# Patient Record
Sex: Female | Born: 1950
Health system: Southern US, Community
[De-identification: ages and names within clinical notes are randomized; demographics above are authoritative.]

## PROBLEM LIST (undated history)

## (undated) DIAGNOSIS — I890 Lymphedema, not elsewhere classified: Secondary | ICD-10-CM

## (undated) DIAGNOSIS — M1990 Unspecified osteoarthritis, unspecified site: Secondary | ICD-10-CM

## (undated) DIAGNOSIS — M810 Age-related osteoporosis without current pathological fracture: Secondary | ICD-10-CM

## (undated) HISTORY — PX: BARIATRIC SURGERY: SHX1103

## (undated) HISTORY — PX: OTHER SURGICAL HISTORY: SHX169

## (undated) HISTORY — DX: Unspecified osteoarthritis, unspecified site: M19.90

## (undated) HISTORY — PX: OOPHORECTOMY: SHX86

---

## 1978-11-10 HISTORY — PX: ABDOMINAL HYSTERECTOMY: SHX81

## 2009-11-10 HISTORY — PX: CHOLECYSTECTOMY: SHX55

## 2017-02-23 ENCOUNTER — Encounter: Payer: Self-pay | Admitting: Family

## 2017-02-23 ENCOUNTER — Ambulatory Visit (INDEPENDENT_AMBULATORY_CARE_PROVIDER_SITE_OTHER): Payer: Self-pay | Admitting: Family

## 2017-02-23 VITALS — BP 144/84 | HR 62 | Temp 98.1°F | Ht 64.0 in | Wt 178.8 lb

## 2017-02-23 DIAGNOSIS — Z0000 Encounter for general adult medical examination without abnormal findings: Secondary | ICD-10-CM

## 2017-02-23 DIAGNOSIS — Z1231 Encounter for screening mammogram for malignant neoplasm of breast: Secondary | ICD-10-CM

## 2017-02-23 DIAGNOSIS — Z1239 Encounter for other screening for malignant neoplasm of breast: Secondary | ICD-10-CM

## 2017-02-23 DIAGNOSIS — E785 Hyperlipidemia, unspecified: Secondary | ICD-10-CM

## 2017-02-23 NOTE — Patient Instructions (Signed)
Tdap at local pharmacy  Great meeting you  Come back for your physical and fasting labs.

## 2017-02-23 NOTE — Progress Notes (Signed)
Pre visit review using our clinic review tool, if applicable. No additional management support is needed unless otherwise documented below in the visit note. 

## 2017-02-23 NOTE — Progress Notes (Signed)
Subjective:    Patient ID: Brittany Farley, female    DOB: Oct 04, 1951, 66 y.o.   MRN: [id number]  CC: Brittany Farley is a 66 y.o. female who presents today to establish care and follow up on chronic disease.    HPI: Overall feeling well today. Came from Dr Truman Hayward in [facility]. Requesting records.    HLD- family history of  HLD. on lipitor.   She notes significant history one year ago, 06/2016 - an episode of left arm numbness. At that time. had a stress test at that time which was normal. Started at that time on ASA, lipitor. No chest pain or arm pain at this time.       last mammogram 06/2016. No longer does pap. Would like to do cologuard.   HISTORY:  Past Medical History:  Diagnosis Date  . Arthritis    Past Surgical History:  Procedure Laterality Date  . ABDOMINAL HYSTERECTOMY  1980  . CHOLECYSTECTOMY  2011   Family History  Problem Relation Age of Onset  . Breast cancer Mother 20  . Breast cancer Maternal Aunt 70  . Lung cancer Sister   . Diabetes Sister   . Prostate cancer Brother   . Heart disease Brother   . Stroke Cousin   . Hypertension Cousin     Allergies: Patient has no allergy information on record. No current outpatient prescriptions on file prior to visit.   No current facility-administered medications on file prior to visit.     Social History  Substance Use Topics  . Smoking status: Never Smoker  . Smokeless tobacco: Never Used  . Alcohol use Yes     Comment: occasional    Review of Systems  Constitutional: Negative for chills and fever.  Eyes: Negative for visual disturbance.  Respiratory: Negative for cough.   Cardiovascular: Negative for chest pain and palpitations.  Gastrointestinal: Negative for nausea and vomiting.  Neurological: Negative for numbness.      Objective:    BP (!) 144/84   Pulse 62   Temp 98.1 F (36.7 C) (Oral)   Ht 5\' 4"  (1.626 m)   Wt 178 lb 12.8 oz (81.1 kg)   SpO2 96%   BMI 30.69 kg/m  BP Readings from  Last 3 Encounters:  02/23/17 (!) 144/84   Wt Readings from Last 3 Encounters:  02/23/17 178 lb 12.8 oz (81.1 kg)    Physical Exam  Constitutional: She appears well-developed and well-nourished.  Eyes: Conjunctivae are normal.  Cardiovascular: Normal rate, regular rhythm, normal heart sounds and normal pulses.   Pulmonary/Chest: Effort normal and breath sounds normal. She has no wheezes. She has no rhonchi. She has no rales.  Neurological: She is alert.  Skin: Skin is warm and dry.  Psychiatric: She has a normal mood and affect. Her speech is normal and behavior is normal. Thought content normal.  Vitals reviewed.      Assessment & Plan:   Problem List Items Addressed This Visit      Other   Screening for breast cancer - Primary    Ordered. Patient understands to schedule.       Relevant Orders   MM DIGITAL SCREENING BILATERAL   Hyperlipidemia    Chronic. On lipitor. Will check lipid panel when fasting when patient returns for CPE.       Relevant Medications   atorvastatin (LIPITOR) 10 MG tablet   aspirin EC 81 MG tablet   Encounter for medical examination to establish care  Requesting records. cologuard ordered. She will return for CPE and labs.           I am having [name] maintain her atorvastatin, Vitamin D3, aspirin EC, and vitamin B-12.   Meds ordered this encounter  Medications  . atorvastatin (LIPITOR) 10 MG tablet    Sig: Take 10 mg by mouth daily.  . Cholecalciferol (VITAMIN D3) 2000 units TABS    Sig: Take by mouth.  Marland Kitchen aspirin EC 81 MG tablet    Sig: Take 81 mg by mouth daily.  . vitamin B-12 (CYANOCOBALAMIN) 1000 MCG tablet    Sig: Take 1,000 mcg by mouth daily.    Return precautions given.   Risks, benefits, and alternatives of the medications and treatment plan prescribed today were discussed, and patient expressed understanding.   Education regarding symptom management and diagnosis given to patient on AVS.  Continue to follow with  [name], FNP for routine health maintenance.   [name] and I agreed with plan.   [name], FNP

## 2017-02-24 ENCOUNTER — Encounter: Payer: Self-pay | Admitting: Family

## 2017-02-24 DIAGNOSIS — Z0000 Encounter for general adult medical examination without abnormal findings: Secondary | ICD-10-CM | POA: Insufficient documentation

## 2017-02-24 DIAGNOSIS — Z1239 Encounter for other screening for malignant neoplasm of breast: Secondary | ICD-10-CM | POA: Insufficient documentation

## 2017-02-24 DIAGNOSIS — E785 Hyperlipidemia, unspecified: Secondary | ICD-10-CM | POA: Insufficient documentation

## 2017-02-24 NOTE — Assessment & Plan Note (Signed)
Requesting records. cologuard ordered. She will return for CPE and labs.

## 2017-02-24 NOTE — Assessment & Plan Note (Addendum)
Ordered. Patient understands to schedule 

## 2017-02-24 NOTE — Assessment & Plan Note (Signed)
Chronic. On lipitor. Will check lipid panel when fasting when patient returns for CPE.

## 2017-02-26 ENCOUNTER — Telehealth: Payer: Self-pay | Admitting: *Deleted

## 2017-02-26 MED ORDER — ATORVASTATIN CALCIUM 10 MG PO TABS: 10.0000 mg | ORAL_TABLET | Freq: Every day | ORAL | 2 refills | Status: DC

## 2017-02-26 NOTE — Telephone Encounter (Signed)
Medication has been refilled.

## 2017-02-26 NOTE — Telephone Encounter (Signed)
Medication refill requested for atorvastatin- 90 day supply  [facility] -fax [phone]  Pt contact [phone]

## 2017-02-27 NOTE — Progress Notes (Signed)
Patient was given information to schedule mammo

## 2017-03-03 ENCOUNTER — Telehealth: Payer: Self-pay | Admitting: Family

## 2017-03-03 DIAGNOSIS — Z1211 Encounter for screening for malignant neoplasm of colon: Secondary | ICD-10-CM | POA: Diagnosis not present

## 2017-03-03 DIAGNOSIS — Z1212 Encounter for screening for malignant neoplasm of rectum: Secondary | ICD-10-CM | POA: Diagnosis not present

## 2017-03-03 DIAGNOSIS — L659 Nonscarring hair loss, unspecified: Secondary | ICD-10-CM

## 2017-03-03 NOTE — Telephone Encounter (Signed)
Patient has been informed of Mammogram location and number.

## 2017-03-03 NOTE — Progress Notes (Signed)
Patient has been informed.

## 2017-03-03 NOTE — Progress Notes (Signed)
Left message for patient to return call back.  

## 2017-03-03 NOTE — Telephone Encounter (Signed)
Pt called and stated that she was returning your call. Thank you!  Call pt @ [phone]

## 2017-03-04 LAB — COLOGUARD: COLOGUARD: NEGATIVE

## 2017-03-10 ENCOUNTER — Telehealth: Payer: Self-pay | Admitting: Family

## 2017-03-10 NOTE — Telephone Encounter (Signed)
Left message for patient to return call back.  

## 2017-03-10 NOTE — Telephone Encounter (Signed)
Please notify pt that cologuard neg  She may continue screening in 3 years unless sxs prior

## 2017-03-11 NOTE — Telephone Encounter (Signed)
Patient was informed of results.  Patient understood and no questions, comments, or concerns at this time.  

## 2017-03-11 NOTE — Telephone Encounter (Signed)
Pt called back returning your call. Thank you! °

## 2017-03-23 NOTE — Telephone Encounter (Signed)
Please advise 

## 2017-03-23 NOTE — Telephone Encounter (Signed)
Pt called and is requesting a referral for her hair loss. She states for the past 5 wks she has been losing her hair and it is now to the point where she is almost bald. She does not have a preference in where she goes. Please advise. Pt cb [phone]

## 2017-03-24 NOTE — Telephone Encounter (Signed)
Call pt  So sorry to hear. Referral placed to dermatology

## 2017-03-25 ENCOUNTER — Telehealth: Payer: Self-pay | Admitting: *Deleted

## 2017-03-25 NOTE — Telephone Encounter (Signed)
Patient has requested to see Christella Noa in [facility] for a dermatology  Pt contact [phone]

## 2017-04-01 ENCOUNTER — Encounter: Payer: Self-pay | Admitting: Family

## 2017-04-01 ENCOUNTER — Encounter (INDEPENDENT_AMBULATORY_CARE_PROVIDER_SITE_OTHER): Payer: Self-pay

## 2017-04-01 ENCOUNTER — Ambulatory Visit (INDEPENDENT_AMBULATORY_CARE_PROVIDER_SITE_OTHER): Payer: PPO | Admitting: Family

## 2017-04-01 VITALS — BP 120/76 | HR 56 | Temp 98.1°F | Resp 12 | Ht 64.0 in | Wt 172.8 lb

## 2017-04-01 DIAGNOSIS — R079 Chest pain, unspecified: Secondary | ICD-10-CM

## 2017-04-01 DIAGNOSIS — Z23 Encounter for immunization: Secondary | ICD-10-CM | POA: Diagnosis not present

## 2017-04-01 DIAGNOSIS — Z0000 Encounter for general adult medical examination without abnormal findings: Secondary | ICD-10-CM | POA: Diagnosis not present

## 2017-04-01 LAB — CBC WITH DIFFERENTIAL/PLATELET
Basophils Absolute: 0 10*3/uL (ref 0.0–0.1)
Basophils Relative: 0.7 % (ref 0.0–3.0)
EOS ABS: 0.4 10*3/uL (ref 0.0–0.7)
Eosinophils Relative: 10.2 % — ABNORMAL HIGH (ref 0.0–5.0)
HCT: 41.3 % (ref 36.0–46.0)
HEMOGLOBIN: 13.6 g/dL (ref 12.0–15.0)
LYMPHS ABS: 1.7 10*3/uL (ref 0.7–4.0)
Lymphocytes Relative: 37.6 % (ref 12.0–46.0)
MCHC: 32.9 g/dL (ref 30.0–36.0)
MCV: 75.2 fl — ABNORMAL LOW (ref 78.0–100.0)
MONO ABS: 0.3 10*3/uL (ref 0.1–1.0)
Monocytes Relative: 7.5 % (ref 3.0–12.0)
NEUTROS PCT: 44 % (ref 43.0–77.0)
Neutro Abs: 1.9 10*3/uL (ref 1.4–7.7)
Platelets: 309 10*3/uL (ref 150.0–400.0)
RBC: 5.49 Mil/uL — AB (ref 3.87–5.11)
RDW: 14.4 % (ref 11.5–15.5)
WBC: 4.4 10*3/uL (ref 4.0–10.5)

## 2017-04-01 LAB — COMPREHENSIVE METABOLIC PANEL
ALBUMIN: 4 g/dL (ref 3.5–5.2)
ALK PHOS: 151 U/L — AB (ref 39–117)
ALT: 23 U/L (ref 0–35)
AST: 33 U/L (ref 0–37)
BILIRUBIN TOTAL: 0.4 mg/dL (ref 0.2–1.2)
BUN: 18 mg/dL (ref 6–23)
CO2: 26 mEq/L (ref 19–32)
Calcium: 9.6 mg/dL (ref 8.4–10.5)
Chloride: 108 mEq/L (ref 96–112)
Creatinine, Ser: 0.73 mg/dL (ref 0.40–1.20)
GFR: 84.34 mL/min (ref >60.00)
GLUCOSE: 91 mg/dL (ref 70–99)
POTASSIUM: 4.6 meq/L (ref 3.5–5.1)
SODIUM: 141 meq/L (ref 135–145)
TOTAL PROTEIN: 7.2 g/dL (ref 6.0–8.3)

## 2017-04-01 LAB — LIPID PANEL
CHOLESTEROL: 133 mg/dL (ref 0–200)
HDL: 57.5 mg/dL (ref >39.00)
LDL Cholesterol: 66 mg/dL (ref 0–99)
NonHDL: 75.76
Total CHOL/HDL Ratio: 2
Triglycerides: 48 mg/dL (ref 0.0–149.0)
VLDL: 9.6 mg/dL (ref 0.0–40.0)

## 2017-04-01 LAB — VITAMIN D 25 HYDROXY (VIT D DEFICIENCY, FRACTURES): VITD: 47.3 ng/mL (ref 30.00–100.00)

## 2017-04-01 LAB — HEMOGLOBIN A1C: HEMOGLOBIN A1C: 6.2 % (ref 4.6–6.5)

## 2017-04-01 LAB — TSH: TSH: 1.34 u[IU]/mL (ref 0.35–4.50)

## 2017-04-01 MED ORDER — PNEUMOCOCCAL 13-VAL CONJ VACC IM SUSP: 0.5000 mL | Freq: Once | INTRAMUSCULAR | Status: DC

## 2017-04-01 NOTE — Patient Instructions (Addendum)
Shingrex series for shingles.   Prevnar given today. Pneumovax one year later.   Cardiology referral.   Please stay vigilant with any signs chest pain, shortness of breath, left arm pain, jaw pain, please go to emergency room immediately   Health Maintenance for Postmenopausal Women Menopause is a normal process in which your reproductive ability comes to an end. This process happens gradually over a span of months to years, usually between the ages of 34 and 2. Menopause is complete when you have missed 12 consecutive menstrual periods. It is important to talk with your health care provider about some of the most common conditions that affect postmenopausal women, such as heart disease, cancer, and bone loss (osteoporosis). Adopting a healthy lifestyle and getting preventive care can help to promote your health and wellness. Those actions can also lower your chances of developing some of these common conditions. What should I know about menopause? During menopause, you may experience a number of symptoms, such as:  Moderate-to-severe hot flashes.  Night sweats.  Decrease in sex drive.  Mood swings.  Headaches.  Tiredness.  Irritability.  Memory problems.  Insomnia. Choosing to treat or not to treat menopausal changes is an individual decision that you make with your health care provider. What should I know about hormone replacement therapy and supplements? Hormone therapy products are effective for treating symptoms that are associated with menopause, such as hot flashes and night sweats. Hormone replacement carries certain risks, especially as you become older. If you are thinking about using estrogen or estrogen with progestin treatments, discuss the benefits and risks with your health care provider. What should I know about heart disease and stroke? Heart disease, heart attack, and stroke become more likely as you age. This may be due, in part, to the hormonal changes that  your body experiences during menopause. These can affect how your body processes dietary fats, triglycerides, and cholesterol. Heart attack and stroke are both medical emergencies. There are many things that you can do to help prevent heart disease and stroke:  Have your blood pressure checked at least every 1-2 years. High blood pressure causes heart disease and increases the risk of stroke.  If you are 70-35 years old, ask your health care provider if you should take aspirin to prevent a heart attack or a stroke.  Do not use any tobacco products, including cigarettes, chewing tobacco, or electronic cigarettes. If you need help quitting, ask your health care provider.  It is important to eat a healthy diet and maintain a healthy weight.  Be sure to include plenty of vegetables, fruits, low-fat dairy products, and lean protein.  Avoid eating foods that are high in solid fats, added sugars, or salt (sodium).  Get regular exercise. This is one of the most important things that you can do for your health.  Try to exercise for at least 150 minutes each week. The type of exercise that you do should increase your heart rate and make you sweat. This is known as moderate-intensity exercise.  Try to do strengthening exercises at least twice each week. Do these in addition to the moderate-intensity exercise.  Know your numbers.Ask your health care provider to check your cholesterol and your blood glucose. Continue to have your blood tested as directed by your health care provider. What should I know about cancer screening? There are several types of cancer. Take the following steps to reduce your risk and to catch any cancer development as early as possible. Breast Cancer  Practice breast self-awareness.  This means understanding how your breasts normally appear and feel.  It also means doing regular breast self-exams. Let your health care provider know about any changes, no matter how  small.  If you are 58 or older, have a clinician do a breast exam (clinical breast exam or CBE) every year. Depending on your age, family history, and medical history, it may be recommended that you also have a yearly breast X-ray (mammogram).  If you have a family history of breast cancer, talk with your health care provider about genetic screening.  If you are at high risk for breast cancer, talk with your health care provider about having an MRI and a mammogram every year.  Breast cancer (BRCA) gene test is recommended for women who have family members with BRCA-related cancers. Results of the assessment will determine the need for genetic counseling and BRCA1 and for BRCA2 testing. BRCA-related cancers include these types:  Breast. This occurs in males or females.  Ovarian.  Tubal. This may also be called fallopian tube cancer.  Cancer of the abdominal or pelvic lining (peritoneal cancer).  Prostate.  Pancreatic. Cervical, Uterine, and Ovarian Cancer  Your health care provider may recommend that you be screened regularly for cancer of the pelvic organs. These include your ovaries, uterus, and vagina. This screening involves a pelvic exam, which includes checking for microscopic changes to the surface of your cervix (Pap test).  For women ages 21-65, health care providers may recommend a pelvic exam and a Pap test every three years. For women ages 49-65, they may recommend the Pap test and pelvic exam, combined with testing for human papilloma virus (HPV), every five years. Some types of HPV increase your risk of cervical cancer. Testing for HPV may also be done on women of any age who have unclear Pap test results.  Other health care providers may not recommend any screening for nonpregnant women who are considered low risk for pelvic cancer and have no symptoms. Ask your health care provider if a screening pelvic exam is right for you.  If you have had past treatment for cervical  cancer or a condition that could lead to cancer, you need Pap tests and screening for cancer for at least 20 years after your treatment. If Pap tests have been discontinued for you, your risk factors (such as having a new sexual partner) need to be reassessed to determine if you should start having screenings again. Some women have medical problems that increase the chance of getting cervical cancer. In these cases, your health care provider may recommend that you have screening and Pap tests more often.  If you have a family history of uterine cancer or ovarian cancer, talk with your health care provider about genetic screening.  If you have vaginal bleeding after reaching menopause, tell your health care provider.  There are currently no reliable tests available to screen for ovarian cancer. Lung Cancer  Lung cancer screening is recommended for adults 74-77 years old who are at high risk for lung cancer because of a history of smoking. A yearly low-dose CT scan of the lungs is recommended if you:  Currently smoke.  Have a history of at least 30 pack-years of smoking and you currently smoke or have quit within the past 15 years. A pack-year is smoking an average of one pack of cigarettes per day for one year. Yearly screening should:  Continue until it has been 15 years since you quit.  Stop  if you develop a health problem that would prevent you from having lung cancer treatment. Colorectal Cancer  This type of cancer can be detected and can often be prevented.  Routine colorectal cancer screening usually begins at age 66 and continues through age 3.  If you have risk factors for colon cancer, your health care provider may recommend that you be screened at an earlier age.  If you have a family history of colorectal cancer, talk with your health care provider about genetic screening.  Your health care provider may also recommend using home test kits to check for hidden blood in your  stool.  A small camera at the end of a tube can be used to examine your colon directly (sigmoidoscopy or colonoscopy). This is done to check for the earliest forms of colorectal cancer.  Direct examination of the colon should be repeated every 5-10 years until age 81. However, if early forms of precancerous polyps or small growths are found or if you have a family history or genetic risk for colorectal cancer, you may need to be screened more often. Skin Cancer  Check your skin from head to toe regularly.  Monitor any moles. Be sure to tell your health care provider:  About any new moles or changes in moles, especially if there is a change in a mole's shape or color.  If you have a mole that is larger than the size of a pencil eraser.  If any of your family members has a history of skin cancer, especially at a young age, talk with your health care provider about genetic screening.  Always use sunscreen. Apply sunscreen liberally and repeatedly throughout the day.  Whenever you are outside, protect yourself by wearing long sleeves, pants, a wide-brimmed hat, and sunglasses. What should I know about osteoporosis? Osteoporosis is a condition in which bone destruction happens more quickly than new bone creation. After menopause, you may be at an increased risk for osteoporosis. To help prevent osteoporosis or the bone fractures that can happen because of osteoporosis, the following is recommended:  If you are 66-49 years old, get at least 1,000 mg of calcium and at least 600 mg of vitamin D per day.  If you are older than age 76 but younger than age 70, get at least 1,200 mg of calcium and at least 600 mg of vitamin D per day.  If you are older than age 89, get at least 1,200 mg of calcium and at least 800 mg of vitamin D per day. Smoking and excessive alcohol intake increase the risk of osteoporosis. Eat foods that are rich in calcium and vitamin D, and do weight-bearing exercises several  times each week as directed by your health care provider. What should I know about how menopause affects my mental health? Depression may occur at any age, but it is more common as you become older. Common symptoms of depression include:  Low or sad mood.  Changes in sleep patterns.  Changes in appetite or eating patterns.  Feeling an overall lack of motivation or enjoyment of activities that you previously enjoyed.  Frequent crying spells. Talk with your health care provider if you think that you are experiencing depression. What should I know about immunizations? It is important that you get and maintain your immunizations. These include:  Tetanus, diphtheria, and pertussis (Tdap) booster vaccine.  Influenza every year before the flu season begins.  Pneumonia vaccine.  Shingles vaccine. Your health care provider may also recommend other  immunizations. This information is not intended to replace advice given to you by your health care provider. Make sure you discuss any questions you have with your health care provider. Document Released: 12/19/2005 Document Revised: 05/16/2016 Document Reviewed: 07/31/2015 Elsevier Interactive Patient Education  2017 [employer].

## 2017-04-01 NOTE — Assessment & Plan Note (Addendum)
Episode of chest pain after exercise. Reassured is no cardiac symptoms today or recurrence of chest pain. No prior EKG to compare to. EKG shows sinus rhythm. Bradycardia. No acute ischemia noted. Referral to cardiology as patient will likely require stress test, echocardiogram.

## 2017-04-01 NOTE — Assessment & Plan Note (Signed)
Color guard negative. Scheduled mammogram.  declines pelvic exam in the absence of symptoms, history of hysterectomy. Prevnar given. Screening labs ordered. Encouraged continued exercise.

## 2017-04-01 NOTE — Progress Notes (Signed)
Pre-visit discussion using our clinic review tool. No additional management support is needed unless otherwise documented below in the visit note.  

## 2017-04-01 NOTE — Progress Notes (Signed)
Subjective:    Patient ID: Brittany Farley, female    DOB: June 26, 1951, 66 y.o.   MRN: [id number]  CC: Brittany Farley is a 66 y.o. female who presents today for physical exam.    HPI: Reports episode of chest pain after 3 hour work out. Was sitting at desk writing notes and felt tightness for couple of seconds. Then resolved. Not r/t food. Denies numbness or tingling radiating to left arm or jaw, palpitations, dizziness, frequent headaches, changes in vision, or shortness of breath. No jaw pain, epigastric pain.  Doesn't recall stress test.  Family history of heart disease, CAD.     Colorectal Cancer Screening: cologuard negative.  Breast Cancer Screening: Mammogram scheduled.  Cervical Cancer Screening: h/o hysterectomy; no h/o GYN cancer. Had a pap one year ago, normal per patient.  Bone Health screening/DEXA for 65+: No increased fracture risk. Defer screening at this time. Lung Cancer Screening: Doesn't have 30 year pack year history and age > 51 years.  Immunizations       Tetanus - utd        Pneumococcal - pneuomovax 10/23/15.  Hepatitis C screening - Candidate for, consented Labs: Screening labs today. Exercise: Gets regular exercise.  Alcohol use: occasional Smoking/tobacco use: Nonsmoker.  Regular dental exams: In need of dental exam. Will establish Wears seat belt: Yes. Skin: dr Brittany Farley,   HISTORY:  Past Medical History:  Diagnosis Date  . Arthritis     Past Surgical History:  Procedure Laterality Date  . ABDOMINAL HYSTERECTOMY  1980  . CHOLECYSTECTOMY  2011   Family History  Problem Relation Age of Onset  . Breast cancer Mother 49  . Breast cancer Maternal Aunt 70  . Lung cancer Sister   . Diabetes Sister   . Prostate cancer Brother   . Heart disease Brother   . Stroke Cousin   . Hypertension Cousin       ALLERGIES: Patient has no allergy information on record.  Current Outpatient Prescriptions on File Prior to Visit  Medication Sig Dispense Refill  .  aspirin EC 81 MG tablet Take 81 mg by mouth daily.    Marland Kitchen atorvastatin (LIPITOR) 10 MG tablet Take 1 tablet (10 mg total) by mouth daily. 90 tablet 2  . Cholecalciferol (VITAMIN D3) 2000 units TABS Take by mouth.    . vitamin B-12 (CYANOCOBALAMIN) 1000 MCG tablet Take 1,000 mcg by mouth daily.     No current facility-administered medications on file prior to visit.     Social History  Substance Use Topics  . Smoking status: Never Smoker  . Smokeless tobacco: Never Used  . Alcohol use Yes     Comment: occasional    Review of Systems  Constitutional: Negative for chills, fever and unexpected weight change.  HENT: Negative for congestion.   Eyes: Negative for visual disturbance.  Respiratory: Negative for cough.   Cardiovascular: Positive for chest pain. Negative for palpitations and leg swelling.  Gastrointestinal: Negative for nausea and vomiting.  Musculoskeletal: Negative for arthralgias and myalgias.  Skin: Negative for rash.  Neurological: Negative for headaches.  Hematological: Negative for adenopathy.  Psychiatric/Behavioral: Negative for confusion.      Objective:    BP 120/76 (BP Location: Left Arm, Patient Position: Sitting, Cuff Size: Normal)   Pulse (!) 56   Temp 98.1 F (36.7 C) (Oral)   Resp 12   Ht 5\' 4"  (1.626 m)   Wt 172 lb 12.8 oz (78.4 kg)   SpO2 98%   BMI  29.66 kg/m   BP Readings from Last 3 Encounters:  04/01/17 120/76  02/23/17 (!) 144/84   Wt Readings from Last 3 Encounters:  04/01/17 172 lb 12.8 oz (78.4 kg)  02/23/17 178 lb 12.8 oz (81.1 kg)    Physical Exam  Constitutional: She appears well-developed and well-nourished.  Eyes: Conjunctivae are normal.  Neck: No thyroid mass and no thyromegaly present.  Cardiovascular: Normal rate, regular rhythm, normal heart sounds and normal pulses.   Pulmonary/Chest: Effort normal and breath sounds normal. She has no wheezes. She has no rhonchi. She has no rales. Right breast exhibits no inverted  nipple, no mass, no nipple discharge, no skin change and no tenderness. Left breast exhibits no inverted nipple, no mass, no nipple discharge, no skin change and no tenderness. Breasts are symmetrical.  CBE performed.   Lymphadenopathy:       Head (right side): No submental, no submandibular, no tonsillar, no preauricular, no posterior auricular and no occipital adenopathy present.       Head (left side): No submental, no submandibular, no tonsillar, no preauricular, no posterior auricular and no occipital adenopathy present.    She has no cervical adenopathy.       Right cervical: No superficial cervical, no deep cervical and no posterior cervical adenopathy present.      Left cervical: No superficial cervical, no deep cervical and no posterior cervical adenopathy present.    She has no axillary adenopathy.  Neurological: She is alert.  Skin: Skin is warm and dry.  Psychiatric: She has a normal mood and affect. Her speech is normal and behavior is normal. Thought content normal.  Vitals reviewed.      Assessment & Plan:   Problem List Items Addressed This Visit      Other   Routine physical examination - Primary    Color guard negative. Scheduled mammogram.  declines pelvic exam in the absence of symptoms, history of hysterectomy. Prevnar given. Screening labs ordered. Encouraged continued exercise.      Relevant Medications   pneumococcal 13-valent conjugate vaccine (PREVNAR 13) injection 0.5 mL   Other Relevant Orders   Comprehensive metabolic panel   CBC with Differential/Platelet   Hemoglobin A1c   Lipid panel   TSH   VITAMIN D 25 Hydroxy (Vit-D Deficiency, Fractures)   Hepatitis C antibody   Chest pain    Episode of chest pain after exercise. Reassured is no cardiac symptoms today or recurrence of chest pain. No prior EKG to compare to. EKG shows sinus rhythm. Bradycardia. No acute ischemia noted. Referral to cardiology as patient will likely require stress test,  echocardiogram.      Relevant Orders   Ambulatory referral to Cardiology   EKG 12-Lead (Completed)    Other Visit Diagnoses    Need for pneumococcal vaccination           I am having [name] maintain her Vitamin D3, aspirin EC, vitamin B-12, and atorvastatin. We will continue to administer pneumococcal 13-valent conjugate vaccine.   Meds ordered this encounter  Medications  . pneumococcal 13-valent conjugate vaccine (PREVNAR 13) injection 0.5 mL    Return precautions given.   Risks, benefits, and alternatives of the medications and treatment plan prescribed today were discussed, and patient expressed understanding.   Education regarding symptom management and diagnosis given to patient on AVS.   Continue to follow with Burnard Hawthorne, FNP for routine health maintenance.   [name] and I agreed with plan.   [name],  FNP    

## 2017-04-02 ENCOUNTER — Other Ambulatory Visit: Payer: Self-pay | Admitting: Family

## 2017-04-02 DIAGNOSIS — R748 Abnormal levels of other serum enzymes: Secondary | ICD-10-CM

## 2017-04-02 LAB — HEPATITIS C ANTIBODY: HCV Ab: NEGATIVE

## 2017-04-03 DIAGNOSIS — Z853 Personal history of malignant neoplasm of breast: Secondary | ICD-10-CM | POA: Diagnosis not present

## 2017-04-03 DIAGNOSIS — L658 Other specified nonscarring hair loss: Secondary | ICD-10-CM | POA: Diagnosis not present

## 2017-04-03 DIAGNOSIS — Z85118 Personal history of other malignant neoplasm of bronchus and lung: Secondary | ICD-10-CM | POA: Diagnosis not present

## 2017-04-08 ENCOUNTER — Telehealth: Payer: Self-pay | Admitting: Family

## 2017-04-08 DIAGNOSIS — R7303 Prediabetes: Secondary | ICD-10-CM

## 2017-04-08 NOTE — Telephone Encounter (Signed)
Pt would like to have a referral to a nutritionist as per her conversation with Arnett.

## 2017-04-08 NOTE — Telephone Encounter (Signed)
Please advise 

## 2017-04-09 NOTE — Telephone Encounter (Signed)
Let pt know referral placed

## 2017-04-09 NOTE — Telephone Encounter (Signed)
Left message for patient to return call back.  Patient needs to be told that referral has been placed to Nutritionist

## 2017-04-09 NOTE — Telephone Encounter (Signed)
Pt called back and informed her of the information.

## 2017-04-10 ENCOUNTER — Other Ambulatory Visit (INDEPENDENT_AMBULATORY_CARE_PROVIDER_SITE_OTHER): Payer: PPO

## 2017-04-10 DIAGNOSIS — R748 Abnormal levels of other serum enzymes: Secondary | ICD-10-CM

## 2017-04-10 NOTE — Addendum Note (Signed)
Addended by: Leeanne Rio on: 04/10/2017 02:12 PM   Modules accepted: Orders

## 2017-04-12 LAB — ALKALINE PHOSPHATASE, ISOENZYMES
ALK PHOS: 162 IU/L — AB (ref 39–117)
BONE FRACTION: 61 % (ref 14–68)
INTESTINAL FRAC.: 6 % (ref 0–18)
LIVER FRACTION: 33 % (ref 18–85)

## 2017-04-15 ENCOUNTER — Other Ambulatory Visit: Payer: Self-pay | Admitting: Family

## 2017-04-15 DIAGNOSIS — R748 Abnormal levels of other serum enzymes: Secondary | ICD-10-CM | POA: Insufficient documentation

## 2017-04-16 NOTE — Telephone Encounter (Signed)
Patient was informed of results.  Patient understood and no questions, comments, or concerns at this time.  

## 2017-04-16 NOTE — Telephone Encounter (Signed)
Pt lvm stating she is rtc brocks call. Pt cb [phone]

## 2017-04-21 NOTE — Telephone Encounter (Signed)
Patient wanted [place] to call insurance company due to a hiccup on our paperwork.  Patients name does not match what insurance has.  Patient informed me that the insurance has older name.

## 2017-04-21 NOTE — Telephone Encounter (Signed)
Patient returned your call.

## 2017-04-21 NOTE — Telephone Encounter (Signed)
Left message for patient to return call back.  

## 2017-04-21 NOTE — Telephone Encounter (Signed)
Pt lvm for [facility] to rtc. She said it is regarding a billing concern. Pt cb [phone]

## 2017-04-22 NOTE — Telephone Encounter (Signed)
I have been placed on hold for 30 minutes.  Unable to reach a person. Will try again at another time.

## 2017-04-23 NOTE — Telephone Encounter (Signed)
Given number to call [phone] from first set of people.  They will have the information patient is wanting fixed.

## 2017-04-28 NOTE — Telephone Encounter (Signed)
Patient was informed that she needed to change her name in Social Security then have a new card mailed.

## 2017-05-19 ENCOUNTER — Encounter: Payer: Self-pay | Admitting: Cardiovascular Disease

## 2017-05-19 ENCOUNTER — Ambulatory Visit (INDEPENDENT_AMBULATORY_CARE_PROVIDER_SITE_OTHER): Payer: PPO | Admitting: Cardiovascular Disease

## 2017-05-19 VITALS — BP 120/70 | HR 62 | Ht 65.0 in | Wt 175.5 lb

## 2017-05-19 DIAGNOSIS — R079 Chest pain, unspecified: Secondary | ICD-10-CM | POA: Diagnosis not present

## 2017-05-19 DIAGNOSIS — E785 Hyperlipidemia, unspecified: Secondary | ICD-10-CM | POA: Diagnosis not present

## 2017-05-19 NOTE — Patient Instructions (Addendum)
Medication Instructions:  Your physician recommends that you continue on your current medications as directed. Please refer to the Current Medication list given to you today.   Labwork: none  Testing/Procedures: Your physician has requested that you have a stress echocardiogram. For further information please visit [email or web address]. Please follow instruction sheet as given.    Follow-Up: Your physician recommends that you schedule a follow-up appointment as needed.    Any Other Special Instructions Will Be Listed Below (If Applicable).     If you need a refill on your cardiac medications before your next appointment, please call your pharmacy.   Exercise Stress Echocardiogram An exercise stress echocardiogram is a test to check how well your heart is working. This test uses sound waves (ultrasound) and a computer to make images of your heart before and after exercise. Ultrasound images that are taken before you exercise (your resting echocardiogram) will show how much blood is getting to your heart muscle and how well your heart muscle and heart valves are functioning. During the next part of this test, you will walk on a treadmill or ride a stationary bike to see how exercise affects your heart. While you exercise, the electrical activity of your heart will be monitored with an electrocardiogram (ECG). Your blood pressure will also be monitored. You may have this test if you:  Have chest pain or other symptoms of a heart problem.  Recently had a heart attack or heart surgery.  Have heart valve problems.  Have a condition that causes narrowing of the blood vessels that supply your heart (coronary artery disease).  Have a high risk of heart disease and are starting a new exercise program.  Have a high risk of heart disease and need to have major surgery.  Tell a health care provider about:  Any allergies you have.  All medicines you are taking, including vitamins,  herbs, eye drops, creams, and over-the-counter medicines.  Any problems you or family members have had with anesthetic medicines.  Any blood disorders you have.  Any surgeries you have had.  Any medical conditions you have.  Whether you are pregnant or may be pregnant. What are the risks? Generally, this is a safe procedure. However, problems may occur, including:  Chest pain.  Dizziness or light-headedness.  Shortness of breath.  Increased or irregular heartbeat (palpitations).  Nausea or vomiting.  Heart attack (very rare).  What happens before the procedure?  Follow instructions from your health care provider about eating or drinking restrictions. You may be asked to avoid all forms of caffeine for 24 hours before your procedure, or as told by your health care provider.  Ask your health care provider about changing or stopping your regular medicines. This is especially important if you are taking diabetes medicines or blood thinners.  If you use an inhaler, bring it with you to the test.  Wear loose, comfortable clothing and walking shoes.  Do notuse any products that contain nicotine or tobacco, such as cigarettes and e-cigarettes, for 4 hours before the test or as told by your health care provider. If you need help quitting, ask your health care provider. What happens during the procedure?  You will take off your clothes from the waist up and put on a hospital gown.  A technician will place electrodes on your chest.  A blood pressure cuff will be placed on your arm.  You will lie down on a table for an ultrasound exam before you exercise. Gel will be rubbed  on your chest, and a handheld device (transducer) will be pressed against your chest and moved over your heart.  Then, you will start exercising by walking on a treadmill or pedaling a stationary bicycle.  Your blood pressure and heart rhythm will be monitored while you exercise.  The exercise will  gradually get harder or faster.  You will exercise until: ? Your heart reaches a target level. ? You are too tired to continue. ? You cannot continue because of chest pain, weakness, or dizziness.  You will have another ultrasound exam after you stop exercising. The procedure may vary among health care providers and hospitals. What happens after the procedure?  Your heart rate and blood pressure will be monitored until they return to your normal levels. Summary  An exercise stress echocardiogram is a test that uses ultrasound to check how well your heart works before and after exercise.  Before the test, follow instructions from your health care provider about stopping medications, avoiding nicotine and tobacco, and avoiding certain foods and drinks.  During the test, your blood pressure and heart rhythm will be monitored while you exercise on a treadmill or stationary bicycle. This information is not intended to replace advice given to you by your health care provider. Make sure you discuss any questions you have with your health care provider. Document Released: 10/31/2004 Document Revised: 06/18/2016 Document Reviewed: 06/18/2016 Elsevier Interactive Patient Education  2018 [employer].

## 2017-05-19 NOTE — Progress Notes (Signed)
Cardiology Office Note   Date:  05/19/2017   ID:  Brittany Farley, DOB Jul 30, 1951, MRN [id number]  PCP:  Burnard Hawthorne, FNP  Cardiologist:   Kathlyn Sacramento, MD   Chief Complaint  Patient presents with  . other    New patient. Patient c/o Chest pain post exercise. Meds reviewed verbally with patient.       History of Present Illness: Brittany Farley is a 66 y.o. female who was referred by [name] for evaluation of chest pain. She has no prior cardiac history. She has known history of hyperlipidemia but no other risk factors for coronary artery disease other than age. She is a lifelong nonsmoker. There is family history of hypertension but not coronary artery disease. She moved from [place] about 5 months ago. She reports previous left arm pain in the past worrisome for a cardiac etiology. However, she reports that the workup was consistent with carpal tunnel syndrome. Over the last few months, she has experienced intermittent episodes of tightness in the upper left back which typically does not happen with exercise. Usually this happens at rest and last for a few minutes. She has occasional left arm numbness. No chest pain or shortness of breath. She does water aerobics for exercise.  Past Medical History:  Diagnosis Date  . Arthritis     Past Surgical History:  Procedure Laterality Date  . ABDOMINAL HYSTERECTOMY  1980  . CHOLECYSTECTOMY  2011     Current Outpatient Prescriptions  Medication Sig Dispense Refill  . aspirin EC 81 MG tablet Take 81 mg by mouth daily.    Marland Kitchen atorvastatin (LIPITOR) 10 MG tablet Take 1 tablet (10 mg total) by mouth daily. 90 tablet 2  . Cholecalciferol (VITAMIN D3) 2000 units TABS Take by mouth.    . vitamin B-12 (CYANOCOBALAMIN) 1000 MCG tablet Take 1,000 mcg by mouth daily.     No current facility-administered medications for this visit.     Allergies:   Patient has no allergy information on record.    Social History:  The patient   reports that she has never smoked. She has never used smokeless tobacco. She reports that she drinks alcohol. She reports that she does not use drugs.   Family History:  The patient's family history includes Breast cancer (age of onset: 12) in her maternal aunt and mother; Diabetes in her sister; Heart disease in her brother; Hypertension in her cousin; Lung cancer in her sister; Prostate cancer in her brother; Stroke in her cousin.    ROS:  Please see the history of present illness.   Otherwise, review of systems are positive for none.   All other systems are reviewed and negative.    PHYSICAL EXAM: VS:  BP 120/70 (BP Location: Right Arm, Patient Position: Sitting, Cuff Size: Normal)   Pulse 62   Ht 5\' 5"  (1.651 m)   Wt 175 lb 8 oz (79.6 kg)   BMI 29.20 kg/m  , BMI Body mass index is 29.2 kg/m. GEN: Well nourished, well developed, in no acute distress  HEENT: normal  Neck: no JVD, carotid bruits, or masses Cardiac: RRR; no murmurs, rubs, or gallops,no edema  Respiratory:  clear to auscultation bilaterally, normal work of breathing GI: soft, nontender, nondistended, + BS MS: no deformity or atrophy  Skin: warm and dry, no rash Neuro:  Strength and sensation are intact Psych: euthymic mood, full affect   EKG:  EKG is ordered today. The ekg ordered today demonstrates normal sinus  rhythm with low voltage.   Recent Labs: 04/01/2017: ALT 23; BUN 18; Creatinine, Ser 0.73; Hemoglobin 13.6; Platelets 309.0; Potassium 4.6; Sodium 141; TSH 1.34    Lipid Panel    Component Value Date/Time   CHOL 133 04/01/2017 1023   TRIG 48.0 04/01/2017 1023   HDL 57.50 04/01/2017 1023   CHOLHDL 2 04/01/2017 1023   VLDL 9.6 04/01/2017 1023   LDLCALC 66 04/01/2017 1023      Wt Readings from Last 3 Encounters:  05/19/17 175 lb 8 oz (79.6 kg)  04/01/17 172 lb 12.8 oz (78.4 kg)  02/23/17 178 lb 12.8 oz (81.1 kg)       PAD Screen 05/19/2017  Previous PAD dx? No  Previous surgical procedure?  No  Pain with walking? No  Feet/toe relief with dangling? No  Painful, non-healing ulcers? No  Extremities discolored? No      ASSESSMENT AND PLAN:  1.  Atypical chest pain: Mostly in the left upper back area and the discomfort is not exertional. Most likely musculoskeletal but she does have risk factors for coronary artery disease. Thus, I recommend evaluation with a stress echocardiogram. Her EKG has low voltage and a treadmill stress test alone might not be sufficient.  2. Hyperlipidemia: Currently on atorvastatin.    Disposition:   FU with me as needed.   Signed,  Kathlyn Sacramento, MD  05/19/2017 3:08 PM    [facility]

## 2017-06-12 ENCOUNTER — Other Ambulatory Visit: Payer: PPO

## 2017-06-15 ENCOUNTER — Telehealth: Payer: Self-pay | Admitting: Cardiovascular Disease

## 2017-06-15 ENCOUNTER — Other Ambulatory Visit: Payer: Self-pay

## 2017-06-15 DIAGNOSIS — R079 Chest pain, unspecified: Secondary | ICD-10-CM

## 2017-06-15 NOTE — Telephone Encounter (Signed)
Left detailed message regarding instructions for stress echo. Provided call back number if questions or need to reschedule.

## 2017-06-16 ENCOUNTER — Other Ambulatory Visit: Payer: PPO

## 2017-06-16 ENCOUNTER — Ambulatory Visit (INDEPENDENT_AMBULATORY_CARE_PROVIDER_SITE_OTHER): Payer: PPO

## 2017-06-16 ENCOUNTER — Other Ambulatory Visit: Payer: Self-pay

## 2017-06-16 DIAGNOSIS — R079 Chest pain, unspecified: Secondary | ICD-10-CM

## 2017-06-18 ENCOUNTER — Ambulatory Visit
Admission: RE | Admit: 2017-06-18 | Discharge: 2017-06-18 | Disposition: A | Discharge status: Home / Self Care | Payer: PPO | Source: Ambulatory Visit | Attending: Family | Admitting: Family

## 2017-06-18 DIAGNOSIS — M85851 Other specified disorders of bone density and structure, right thigh: Secondary | ICD-10-CM | POA: Insufficient documentation

## 2017-06-18 DIAGNOSIS — R748 Abnormal levels of other serum enzymes: Secondary | ICD-10-CM | POA: Diagnosis not present

## 2017-06-18 DIAGNOSIS — Z13820 Encounter for screening for osteoporosis: Secondary | ICD-10-CM | POA: Diagnosis not present

## 2017-06-18 DIAGNOSIS — Z1239 Encounter for other screening for malignant neoplasm of breast: Secondary | ICD-10-CM

## 2017-06-18 DIAGNOSIS — Z1231 Encounter for screening mammogram for malignant neoplasm of breast: Secondary | ICD-10-CM | POA: Insufficient documentation

## 2017-06-18 LAB — EXERCISE TOLERANCE TEST
CSEPEDS: 1 s
CSEPPHR: 136 {beats}/min
Estimated workload: 10.1 METS
Exercise duration (min): 9 min
MPHR: 153 {beats}/min
Percent HR: 88 %
Rest HR: 67 {beats}/min

## 2017-06-19 ENCOUNTER — Encounter: Payer: Self-pay | Admitting: Family

## 2017-06-19 DIAGNOSIS — M8580 Other specified disorders of bone density and structure, unspecified site: Secondary | ICD-10-CM | POA: Insufficient documentation

## 2017-06-25 ENCOUNTER — Ambulatory Visit (INDEPENDENT_AMBULATORY_CARE_PROVIDER_SITE_OTHER): Payer: PPO | Admitting: Family

## 2017-06-25 ENCOUNTER — Encounter: Payer: Self-pay | Admitting: Family

## 2017-06-25 DIAGNOSIS — M8580 Other specified disorders of bone density and structure, unspecified site: Secondary | ICD-10-CM | POA: Diagnosis not present

## 2017-06-25 NOTE — Patient Instructions (Signed)
Continue all the great work you are doing.  So impressed !!

## 2017-06-25 NOTE — Assessment & Plan Note (Addendum)
Discussed results of DEXA scan with patient today. Answered questions regarding vitamin D and calcium supplementation. Education provided on adequate dosing. Encouraged weightbearing exercises,  congratulated patient on diligence to exercise. Discussed at this time, I do not think patient should start biphosphonate which patient agreed with. We will repeat a DEXA scan in 2 years. Will follow.

## 2017-06-25 NOTE — Progress Notes (Signed)
Pre visit review using our clinic review tool, if applicable. No additional management support is needed unless otherwise documented below in the visit note. 

## 2017-06-25 NOTE — Progress Notes (Signed)
Subjective:    Patient ID: Brittany Farley, female    DOB: January 04, 1951, 66 y.o.   MRN: [id number]  CC: Brittany Farley is a 66 y.o. female who presents today for follow up.   HPI:  here to discuss DEXA scan results. Otherwise she feels well today with no other concerns. She had concerned when she saw the osteopenia. She is here today to further understand how exercise, diet  May affect this. She is currently taking vitamin D supplementation, calcium 600 mg and exercises regularly throughout the week including swimming. No history of any fragility fractures      HISTORY:  Past Medical History:  Diagnosis Date  . Arthritis    Past Surgical History:  Procedure Laterality Date  . ABDOMINAL HYSTERECTOMY  1980  . CHOLECYSTECTOMY  2011   Family History  Problem Relation Age of Onset  . Breast cancer Mother 35  . Breast cancer Maternal Aunt 70  . Lung cancer Sister   . Diabetes Sister   . Prostate cancer Brother   . Heart disease Brother   . Stroke Cousin   . Hypertension Cousin     Allergies: Patient has no allergy information on record. Current Outpatient Prescriptions on File Prior to Visit  Medication Sig Dispense Refill  . aspirin EC 81 MG tablet Take 81 mg by mouth daily.    Marland Kitchen atorvastatin (LIPITOR) 10 MG tablet Take 1 tablet (10 mg total) by mouth daily. 90 tablet 2  . Cholecalciferol (VITAMIN D3) 2000 units TABS Take by mouth.    . vitamin B-12 (CYANOCOBALAMIN) 1000 MCG tablet Take 1,000 mcg by mouth daily.     No current facility-administered medications on file prior to visit.     Social History  Substance Use Topics  . Smoking status: Never Smoker  . Smokeless tobacco: Never Used  . Alcohol use Yes     Comment: occasional    Review of Systems  Constitutional: Negative for chills and fever.  Respiratory: Negative for cough.   Cardiovascular: Negative for chest pain and palpitations.  Gastrointestinal: Negative for nausea and vomiting.      Objective:    BP  (!) 98/50   Pulse 62   Temp 98.2 F (36.8 C) (Oral)   Ht 5\' 5"  (1.651 m)   Wt 179 lb (81.2 kg)   SpO2 98%   BMI 29.79 kg/m  BP Readings from Last 3 Encounters:  06/25/17 (!) 98/50  05/19/17 120/70  04/01/17 120/76   Wt Readings from Last 3 Encounters:  06/25/17 179 lb (81.2 kg)  05/19/17 175 lb 8 oz (79.6 kg)  04/01/17 172 lb 12.8 oz (78.4 kg)    Physical Exam  Constitutional: She appears well-developed and well-nourished.  Eyes: Conjunctivae are normal.  Cardiovascular: Normal rate, regular rhythm, normal heart sounds and normal pulses.   Pulmonary/Chest: Effort normal and breath sounds normal. She has no wheezes. She has no rhonchi. She has no rales.  Neurological: She is alert.  Skin: Skin is warm and dry.  Psychiatric: She has a normal mood and affect. Her speech is normal and behavior is normal. Thought content normal.  Vitals reviewed.      Assessment & Plan:   Problem List Items Addressed This Visit      Musculoskeletal and Integument   Osteopenia    Discussed results of DEXA scan with patient today. Answered questions regarding vitamin D and calcium supplementation. Education provided on adequate dosing. Encouraged weightbearing exercises,  congratulated patient on diligence to exercise.  Discussed at this time, I do not think patient should start biphosphonate which patient agreed with. We will repeat a DEXA scan in 2 years. Will follow.          I am having [name] maintain her Vitamin D3, aspirin EC, vitamin B-12, and atorvastatin.   No orders of the defined types were placed in this encounter.   Return precautions given.   Risks, benefits, and alternatives of the medications and treatment plan prescribed today were discussed, and patient expressed understanding.   Education regarding symptom management and diagnosis given to patient on AVS.  Continue to follow with Burnard Hawthorne, FNP for routine health maintenance.   [name] and I  agreed with plan.   Brittany Paris, FNP

## 2017-08-25 ENCOUNTER — Ambulatory Visit (INDEPENDENT_AMBULATORY_CARE_PROVIDER_SITE_OTHER): Payer: PPO

## 2017-08-25 DIAGNOSIS — Z23 Encounter for immunization: Secondary | ICD-10-CM

## 2017-11-23 ENCOUNTER — Telehealth: Payer: Self-pay | Admitting: Family

## 2017-11-23 ENCOUNTER — Ambulatory Visit: Payer: Self-pay | Admitting: *Deleted

## 2017-11-23 NOTE — Telephone Encounter (Signed)
Left voicemail for her to call us back about the Aquavier medication she is calling about.   I do not see this in her medication list.

## 2017-11-23 NOTE — Telephone Encounter (Signed)
See triage note.

## 2017-11-23 NOTE — Telephone Encounter (Signed)
Called c/o a Herpes outbreak in her genital area by her vagina that started a week ago this past Wednesday.   I got her an appt with Jeanmarie Plant, FNP for 11/26/17 at 1:30.  Reason for Disposition . Genital area rash  Answer Assessment - Initial Assessment Questions 1. APPEARANCE of RASH: "Describe the rash."      Looks like a small sore near my vagina.  Slightly raised.   Noting is coming out of it. 2. LOCATION: "Where is the rash located?"      Near my vagina 3. NUMBER: "How many spots are there?"      One 4. SIZE: "How big are the spots?" (Inches, centimeters or compare to size of a coin)      Smaller than a pencil eraser 5. ONSET: "When did the rash start?"      Wednesday of last week. 6. ITCHING: "Does the rash itch?" If so, ask: "How bad is the itch?"  (Scale 1-10; or mild, moderate, severe)     No itching.   It burns when in contact with something like a toilet seat or something.   I have to be careful when I sit. 7. PAIN: "Does the rash hurt?" If so, ask: "How bad is the pain?"  (Scale 1-10; or mild, moderate, severe)     No pain except when it touches something 8. OTHER SYMPTOMS: "Do you have any other symptoms?" (e.g., fever)     No fever    I exercise 4-5 hours a day.   9. PREGNANCY: "Is there any chance you are pregnant?" "When was your last menstrual period?"     Not asked due to age  Protocols used: [place]

## 2017-11-23 NOTE — Telephone Encounter (Signed)
Copied from [place] [id number]. Topic: Quick Communication - Rx Refill/Question >> Nov 23, 2017 12:09 PM Robina Ade, Helene Kelp D wrote: Medication: Linward [place] Pills Has the patient contacted their pharmacy?No (Agent: If no, request that the patient contact the pharmacy for the refill.) Preferred Pharmacy (with phone number or street name): [facility] on [address] Agent: Please be advised that RX refills may take up to 3 business days. We ask that you follow-up with your pharmacy. Patient called and would like refill on aquavier pills. Patient has had it in the pass but is not sure the doze of the medication. If there are any question please call patient back, thanks.

## 2017-11-26 ENCOUNTER — Other Ambulatory Visit (HOSPITAL_COMMUNITY)
Admission: RE | Admit: 2017-11-26 | Discharge: 2017-11-26 | Disposition: A | Discharge status: Home / Self Care | Payer: PPO | Source: Ambulatory Visit | Attending: Family Medicine | Admitting: Family Medicine

## 2017-11-26 ENCOUNTER — Ambulatory Visit (INDEPENDENT_AMBULATORY_CARE_PROVIDER_SITE_OTHER): Payer: PPO | Admitting: Family Medicine

## 2017-11-26 ENCOUNTER — Encounter: Payer: Self-pay | Admitting: Family Medicine

## 2017-11-26 VITALS — BP 146/80 | HR 72 | Temp 98.1°F | Ht 65.0 in | Wt 181.0 lb

## 2017-11-26 DIAGNOSIS — A6000 Herpesviral infection of urogenital system, unspecified: Secondary | ICD-10-CM | POA: Diagnosis not present

## 2017-11-26 DIAGNOSIS — Z113 Encounter for screening for infections with a predominantly sexual mode of transmission: Secondary | ICD-10-CM

## 2017-11-26 DIAGNOSIS — B9689 Other specified bacterial agents as the cause of diseases classified elsewhere: Secondary | ICD-10-CM | POA: Insufficient documentation

## 2017-11-26 DIAGNOSIS — B001 Herpesviral vesicular dermatitis: Secondary | ICD-10-CM

## 2017-11-26 DIAGNOSIS — N760 Acute vaginitis: Secondary | ICD-10-CM | POA: Diagnosis not present

## 2017-11-26 MED ORDER — FAMCICLOVIR 125 MG PO TABS: ORAL_TABLET | ORAL | Status: DC

## 2017-11-26 NOTE — Patient Instructions (Signed)
Please take medication as directed and follow up with Joycelyn Schmid if symptoms do not improve, worsen, or you develop new symptoms.

## 2017-11-26 NOTE — Progress Notes (Signed)
Subjective:    Patient ID: Brittany Farley, female    DOB: [birth year], 67 y.o.   MRN: [id number]  HPI   Brittany Farley is a 67 year old female who presents today for a "bump" on her right labia that has been present for 10 days. She reports that she noticed discomfort after recently starting biking again. She described the discomfort as mild burning and pain that is aggravated with sitting. Pain is improving. Describes "bump" starting as a raised and then changing to an ulcer. No associated fever, chills, sweats, drainage, vaginal discharge, or dysuria noted. No treatments have been tried at home. Alleviating factor is noted as avoiding biking and sitting on a bike seat. She has a history of genital herpes with last outbreak reported > 10 years ago. This outbreak was associated with initiation of biking per patient.   Partners: New partners: One partner in the past 10 years with one episode of intercourse noted prior to symptom Multiple partners: No  Practices: Sexual intercourse: One episode of intercourse  Practices: Condom Use: Yes  Past History: History of STIs: History of genial herpes noted above  She reports interest in screening for STIs at this time.    Review of Systems  Constitutional: Negative for chills, fatigue and fever.  Respiratory: Negative for cough, shortness of breath and wheezing.   Cardiovascular: Negative for chest pain and palpitations.  Gastrointestinal: Negative for abdominal pain, diarrhea, nausea and vomiting.  Genitourinary: Positive for genital sores. Negative for dysuria, frequency, urgency, vaginal discharge and vaginal pain.  Skin:       Bump on labia  Neurological: Negative for dizziness and headaches.   Past Medical History:  Diagnosis Date  . Arthritis      Social History   Socioeconomic History  . Marital status: Single    Spouse name: Not on file  . Number of children: Not on file  . Years of education: Not on file  . Highest education  level: Not on file  Social Needs  . Financial resource strain: Not on file  . Food insecurity - worry: Not on file  . Food insecurity - inability: Not on file  . Transportation needs - medical: Not on file  . Transportation needs - non-medical: Not on file  Occupational History  . Not on file  Tobacco Use  . Smoking status: Never Smoker  . Smokeless tobacco: Never Used  Substance and Sexual Activity  . Alcohol use: Yes    Comment: occasional  . Drug use: No  . Sexual activity: Not on file  Other Topics Concern  . Not on file  Social History Narrative   From [place]   Moved to [place]   Retired       Past Surgical History:  Procedure Laterality Date  . ABDOMINAL HYSTERECTOMY  1980  . CHOLECYSTECTOMY  2011    Family History  Problem Relation Age of Onset  . Breast cancer Mother 46  . Breast cancer Maternal Aunt 70  . Lung cancer Sister   . Diabetes Sister   . Prostate cancer Brother   . Heart disease Brother   . Stroke Cousin   . Hypertension Cousin     No Known Allergies  Current Outpatient Medications on File Prior to Visit  Medication Sig Dispense Refill  . aspirin EC 81 MG tablet Take 81 mg by mouth daily.    Marland Kitchen atorvastatin (LIPITOR) 10 MG tablet Take 1 tablet (10 mg total) by mouth daily. 90 tablet 2  .  Cholecalciferol (VITAMIN D3) 2000 units TABS Take by mouth.    . vitamin B-12 (CYANOCOBALAMIN) 1000 MCG tablet Take 1,000 mcg by mouth daily.     No current facility-administered medications on file prior to visit.     BP (!) 146/80 (BP Location: Left Arm, Patient Position: Sitting, Cuff Size: Normal)   Pulse 72   Temp 98.1 F (36.7 C) (Oral)   Ht 5\' 5"  (1.651 m)   Wt 181 lb (82.1 kg)   SpO2 97%   BMI 30.12 kg/m      Objective:   Physical Exam  Constitutional: She is oriented to person, place, and time. She appears well-developed and well-nourished.  Eyes: Pupils are equal, round, and reactive to light. No scleral icterus.  Neck: Neck  supple.  Cardiovascular: Normal rate and regular rhythm.  Pulmonary/Chest: Effort normal and breath sounds normal.  Abdominal: Soft. Bowel sounds are normal.  Genitourinary:  Genitourinary Comments: Ulcer with mildly erythematous base present on right labia which appears to be healing.  Lymphadenopathy:    She has no cervical adenopathy.  Neurological: She is alert and oriented to person, place, and time. Coordination normal.  Skin: Skin is warm and dry. No rash noted.  Psychiatric: She has a normal mood and affect. Her behavior is normal. Judgment and thought content normal.       Assessment & Plan:  1. Genital herpes simplex, unspecified site Exam and history support recurrent genital herpes outbreak; no recent outbreaks; prior outbreak associated with same trigger; will treat with famciclovir and advise monitoring for worsening symptoms. Patient declined vaginal exam today and exam was limited to external genitalia.  2. Screening for STD (sexually transmitted disease) Discussed importance of safe sex practices and risk factors for STIs; patient opted for STI screenings today. She declined testing for hepatitis but agreed to HIV, and urine cytology of chlamydia, gonorrhea, trichomoniasis, candida, and gardnerella. She is asymptomatic today and reports one episode of intercourse with a condom; she is not interested in empiric treatment at this time.  - Urine cytology ancillary only - HIV antibody (with reflex)  Advised follow up with PCP as scheduled or sooner if symptoms do not improve with treatment.  Delano Metz, FNP-C

## 2017-11-27 LAB — HIV ANTIBODY (ROUTINE TESTING W REFLEX): HIV: NONREACTIVE

## 2017-11-27 LAB — URINE CYTOLOGY ANCILLARY ONLY
CHLAMYDIA, DNA PROBE: NEGATIVE
NEISSERIA GONORRHEA: NEGATIVE
TRICH (WINDOWPATH): NEGATIVE

## 2017-11-30 LAB — URINE CYTOLOGY ANCILLARY ONLY: Candida vaginitis: NEGATIVE

## 2017-12-01 ENCOUNTER — Telehealth: Payer: Self-pay | Admitting: Family Medicine

## 2017-12-01 NOTE — Telephone Encounter (Signed)
Urine cytology is positive for bacterial vaginosis. Metronidazole (0.75%) use 5 grams gel once daily for 5 days.

## 2017-12-02 MED ORDER — METRONIDAZOLE 0.75 % VA GEL: VAGINAL | 0 refills | Status: DC

## 2017-12-02 NOTE — Telephone Encounter (Signed)
-----   Message from Delano Metz, [place] sent at 11/30/2017  8:12 AM EST ----- Testing is negative for Chlamydia, gonorrhea, trichomonas, and HIV.

## 2017-12-02 NOTE — Telephone Encounter (Signed)
Patient notified and medication sent to pharmacy as requested.

## 2017-12-03 ENCOUNTER — Other Ambulatory Visit: Payer: Self-pay | Admitting: Family

## 2017-12-24 ENCOUNTER — Ambulatory Visit: Payer: PPO

## 2018-01-05 ENCOUNTER — Ambulatory Visit: Payer: PPO

## 2018-01-28 ENCOUNTER — Ambulatory Visit: Payer: PPO

## 2018-03-02 DIAGNOSIS — H2513 Age-related nuclear cataract, bilateral: Secondary | ICD-10-CM | POA: Diagnosis not present

## 2018-03-09 DIAGNOSIS — H40003 Preglaucoma, unspecified, bilateral: Secondary | ICD-10-CM | POA: Diagnosis not present

## 2018-03-29 DIAGNOSIS — H40003 Preglaucoma, unspecified, bilateral: Secondary | ICD-10-CM | POA: Diagnosis not present

## 2018-06-11 ENCOUNTER — Other Ambulatory Visit: Payer: Self-pay | Admitting: Family

## 2018-06-11 MED ORDER — ATORVASTATIN CALCIUM 10 MG PO TABS: 10.0000 mg | ORAL_TABLET | Freq: Every day | ORAL | 1 refills | Status: DC

## 2018-06-11 NOTE — Telephone Encounter (Signed)
Copied from [place] [id number]. Topic: Quick Communication - Rx Refill/Question >> Jun 11, 2018  9:22 AM Synthia Innocent wrote: Medication: atorvastatin (LIPITOR) 10 MG tablet  Has the patient contacted their pharmacy? Yes.   (Agent: If no, request that the patient contact the pharmacy for the refill.) (Agent: If yes, when and what did the pharmacy advise?)  Preferred Pharmacy (with phone number or street name): [facility] [phone]  Agent: Please be advised that RX refills may take up to 3 business days. We ask that you follow-up with your pharmacy.

## 2018-06-11 NOTE — Telephone Encounter (Signed)
Rx refill request:   Lipitor 10 mg              Last filled: 12/03/17   LOV: 11/26/17 (acute)  PCP:  [facility]  Pharmacy: verified

## 2018-09-03 ENCOUNTER — Ambulatory Visit (INDEPENDENT_AMBULATORY_CARE_PROVIDER_SITE_OTHER): Payer: PPO

## 2018-09-03 ENCOUNTER — Ambulatory Visit (INDEPENDENT_AMBULATORY_CARE_PROVIDER_SITE_OTHER): Payer: PPO | Admitting: Family

## 2018-09-03 ENCOUNTER — Encounter: Payer: Self-pay | Admitting: Family

## 2018-09-03 VITALS — BP 114/76 | HR 72 | Temp 98.0°F | Resp 15 | Ht 65.0 in | Wt 175.0 lb

## 2018-09-03 DIAGNOSIS — M19011 Primary osteoarthritis, right shoulder: Secondary | ICD-10-CM | POA: Diagnosis not present

## 2018-09-03 DIAGNOSIS — Z0000 Encounter for general adult medical examination without abnormal findings: Secondary | ICD-10-CM | POA: Diagnosis not present

## 2018-09-03 DIAGNOSIS — M25511 Pain in right shoulder: Secondary | ICD-10-CM

## 2018-09-03 DIAGNOSIS — M25512 Pain in left shoulder: Secondary | ICD-10-CM | POA: Diagnosis not present

## 2018-09-03 DIAGNOSIS — Z23 Encounter for immunization: Secondary | ICD-10-CM

## 2018-09-03 DIAGNOSIS — M6740 Ganglion, unspecified site: Secondary | ICD-10-CM | POA: Diagnosis not present

## 2018-09-03 DIAGNOSIS — M19012 Primary osteoarthritis, left shoulder: Secondary | ICD-10-CM | POA: Diagnosis not present

## 2018-09-03 DIAGNOSIS — E785 Hyperlipidemia, unspecified: Secondary | ICD-10-CM

## 2018-09-03 NOTE — Assessment & Plan Note (Addendum)
Symptoms c/w ganglion cyst. Declines imaging today as we agreed consult with orthopedics appropriate.

## 2018-09-03 NOTE — Assessment & Plan Note (Signed)
CBE performed. She will schedule to DEXA and mammogram.

## 2018-09-03 NOTE — Progress Notes (Signed)
Subjective:    Patient ID: Brittany Farley, female    DOB: 03-01-1951, 67 y.o.   MRN: [id number]  CC: Brittany Farley is a 67 y.o. female who presents today for physical exam.    HPI:  Complains of  bilateral posterior shoulder pain x 3 weeks , much  improved. Worse on right shoulder. Painful to put coat on. Had noticed the pain while doing lateral weights at gym. No using hand weights which has helped pain.  No neck pain,weakness or numbness in arms.  Teaches water aerobics.   Lump on top of left hand, x one month. Nontender, unchanged in size.   HLD- compliant with lipitor   Colorectal Cancer Screening: Cologuard negative 2018. No family h/o colon cancer.  Breast Cancer Screening: Mammogram due. Would like to repeat bone density Cervical Cancer Screening: h/o hysterectomy. No vaginal bleeding or pain.  Bone Health screening/DEXA for 65+: osteopenia Lung Cancer Screening: Doesn't have 30 year pack year history and age > 28 years.       Tetanus - utd        Labs: Screening labs today. Exercise: Gets regular exercise.  Alcohol use: Occasionally.  Smoking/tobacco use: Nonsmoker.  Regular dental exams: In need of dental exam. Wears seat belt: Yes. Skin: went last year , no skin cancer. No concerning lesions today.   HISTORY:  Past Medical History:  Diagnosis Date  . Arthritis     Past Surgical History:  Procedure Laterality Date  . ABDOMINAL HYSTERECTOMY  1980  . CHOLECYSTECTOMY  2011   Family History  Problem Relation Age of Onset  . Breast cancer Mother 86  . Breast cancer Maternal Aunt 70  . Lung cancer Sister   . Diabetes Sister   . Prostate cancer Brother   . Heart disease Brother   . Stroke Cousin   . Hypertension Cousin       ALLERGIES: Patient has no known allergies.  Current Outpatient Medications on File Prior to Visit  Medication Sig Dispense Refill  . aspirin EC 81 MG tablet Take 81 mg by mouth daily.    Marland Kitchen atorvastatin (LIPITOR) 10 MG tablet Take 1  tablet (10 mg total) by mouth daily. 90 tablet 1  . Cholecalciferol (VITAMIN D3) 2000 units TABS Take by mouth.     No current facility-administered medications on file prior to visit.     Social History   Tobacco Use  . Smoking status: Never Smoker  . Smokeless tobacco: Never Used  Substance Use Topics  . Alcohol use: Yes    Comment: occasional  . Drug use: No    Review of Systems  Constitutional: Negative for chills, fever and unexpected weight change.  HENT: Negative for congestion.   Respiratory: Negative for cough.   Cardiovascular: Negative for chest pain, palpitations and leg swelling.  Gastrointestinal: Negative for nausea and vomiting.  Musculoskeletal: Positive for arthralgias (shoulders). Negative for myalgias.  Skin: Negative for rash.  Neurological: Negative for numbness and headaches.  Hematological: Negative for adenopathy.  Psychiatric/Behavioral: Negative for confusion.      Objective:    BP 114/76 (BP Location: Left Arm, Patient Position: Sitting, Cuff Size: Normal)   Pulse 72   Temp 98 F (36.7 C) (Oral)   Resp 15   Ht 5\' 5"  (1.651 m)   Wt 175 lb (79.4 kg)   SpO2 98%   BMI 29.12 kg/m   BP Readings from Last 3 Encounters:  09/03/18 114/76  11/26/17 (!) 146/80  06/25/17 (!) 98/50  Wt Readings from Last 3 Encounters:  09/03/18 175 lb (79.4 kg)  11/26/17 181 lb (82.1 kg)  06/25/17 179 lb (81.2 kg)    Physical Exam  Constitutional: She appears well-developed and well-nourished.  Eyes: Conjunctivae are normal.  Neck: No thyroid mass and no thyromegaly present.  Cardiovascular: Normal rate, regular rhythm, normal heart sounds and normal pulses.  Pulmonary/Chest: Effort normal and breath sounds normal. She has no wheezes. She has no rhonchi. She has no rales. Right breast exhibits no inverted nipple, no mass, no nipple discharge, no skin change and no tenderness. Left breast exhibits no inverted nipple, no mass, no nipple discharge, no skin  change and no tenderness. Breasts are symmetrical.  CBE performed.   Musculoskeletal:       Right shoulder: She exhibits decreased range of motion and pain. She exhibits no tenderness, no bony tenderness and no spasm.       Left shoulder: She exhibits decreased range of motion and pain. She exhibits no tenderness, no bony tenderness and no spasm.       Left wrist: She exhibits normal range of motion, no tenderness and no bony tenderness.       Arms: Left mass non tender, approx 2 cm. Non fluctuant. No increased erythema, warmth.   Right Shoulder:   No asymmetry of shoulders when comparing right and left.No pain with palpation over glenohumeral joint lines, [place] joint, AC joint, or bicipital groove. pain with internal rotation. No pain with external rotation. No pain with resisted lateral extension .   Negative active painful arc sign. Negative passive arc ( Neer's). Negative drop arm.  Strength and sensation normal BUE's.  Left Shoulder:  No asymmetry of shoulders when comparing right and left.No pain with palpation over glenohumeral joint lines,  joint, AC joint, or bicipital groove. pain with internal rotation. No pain with external rotation. No pain with resisted lateral extension .   Negative active painful arc sign. Negative passive arc ( Neer's). Negative drop arm.  Strength and sensation normal BUE's.  Lymphadenopathy:       Head (right side): No submental, no submandibular, no tonsillar, no preauricular, no posterior auricular and no occipital adenopathy present.       Head (left side): No submental, no submandibular, no tonsillar, no preauricular, no posterior auricular and no occipital adenopathy present.    She has no cervical adenopathy.       Right cervical: No superficial cervical, no deep cervical and no posterior cervical adenopathy present.      Left cervical: No superficial cervical, no deep cervical and no posterior cervical adenopathy present.    She has no axillary  adenopathy.  Neurological: She is alert.  Skin: Skin is warm and dry.  Psychiatric: She has a normal mood and affect. Her speech is normal and behavior is normal. Thought content normal.  Vitals reviewed.      Assessment & Plan:   Problem List Items Addressed This Visit      Other   Hyperlipidemia    Compliant. Will continue medication.      Routine physical examination - Primary    CBE performed. She will schedule to DEXA and mammogram.       Relevant Orders   MM 3D SCREEN BREAST BILATERAL   DG Bone Density   Ambulatory referral to Hematology / Oncology   CBC with Differential/Platelet   Comprehensive metabolic panel   Hemoglobin A1c   Lipid panel   TSH   VITAMIN D 25 Hydroxy (  Vit-D Deficiency, Fractures)   Ganglion    Symptoms c/w ganglion cyst. Declines imaging today as we agreed consult with orthopedics appropriate.       Acute pain of both shoulders    Suspect rotator cuff etiology . Pending xray, PT, and ortho evaluation.       Relevant Orders   DG Shoulder Right   DG Shoulder Left   Ambulatory referral to Physical Therapy   Ambulatory referral to Orthopedic Surgery    Other Visit Diagnoses    Encounter for immunization       Relevant Orders   Flu vaccine HIGH DOSE PF (Completed)       I have discontinued Chimamanda Cromley's vitamin B-12, famciclovir, and metroNIDAZOLE. I am also having her maintain her Vitamin D3, aspirin EC, and atorvastatin.   No orders of the defined types were placed in this encounter.   Return precautions given.   Risks, benefits, and alternatives of the medications and treatment plan prescribed today were discussed, and patient expressed understanding.   Education regarding symptom management and diagnosis given to patient on AVS.   Continue to follow with Burnard Hawthorne, FNP for routine health maintenance.   [name] and I agreed with plan.   [name], FNP

## 2018-09-03 NOTE — Assessment & Plan Note (Signed)
Suspect rotator cuff etiology . Pending xray, PT, and ortho evaluation.

## 2018-09-03 NOTE — Assessment & Plan Note (Signed)
Compliant. Will continue medication.

## 2018-09-03 NOTE — Patient Instructions (Addendum)
Labs next week when you are fasting.   Heat the shoulders twice per for 20 minutes  xrays today  Today we discussed referrals, orders. Orthopedics for shoulder pain and suspect ganglion cyst.     I have placed these orders in the system for you.  Please be sure to give [place] a call if you have not heard from our office regarding this. We should hear from [place] within ONE week with information regarding your appointment. If not, please let me know immediately.    We placed a referral for mammogram this year. You may schedule your bone density as well- they can do on the same day.   I asked that you call one the below locations and schedule this when it is convenient for you.   As discussed, I would like you to ask for 3D mammogram over the traditional 2D mammogram as new evidence suggest 3D is superior.   Please note that NOT all insurance companies cover 3D and you may have to pay a higher copay. You may call your insurance company to further clarify your benefits.   Options for [place]  [place], [place]  * [facility] [place], [place] * Note if you ask for 3D mammogram at this location, you must request [place], [place] location*     Health Maintenance for Postmenopausal Women Menopause is a normal process in which your reproductive ability comes to an end. This process happens gradually over a span of months to years, usually between the ages of 26 and 25. Menopause is complete when you have missed 12 consecutive menstrual periods. It is important to talk with your health care provider about some of the most common conditions that affect postmenopausal women, such as heart disease, cancer, and bone loss (osteoporosis). Adopting a healthy lifestyle and getting preventive care can help to promote your health and wellness. Those actions  can also lower your chances of developing some of these common conditions. What should I know about menopause? During menopause, you may experience a number of symptoms, such as:  Moderate-to-severe hot flashes.  Night sweats.  Decrease in sex drive.  Mood swings.  Headaches.  Tiredness.  Irritability.  Memory problems.  Insomnia.  Choosing to treat or not to treat menopausal changes is an individual decision that you make with your health care provider. What should I know about hormone replacement therapy and supplements? Hormone therapy products are effective for treating symptoms that are associated with menopause, such as hot flashes and night sweats. Hormone replacement carries certain risks, especially as you become older. If you are thinking about using estrogen or estrogen with progestin treatments, discuss the benefits and risks with your health care provider. What should I know about heart disease and stroke? Heart disease, heart attack, and stroke become more likely as you age. This may be due, in part, to the hormonal changes that your body experiences during menopause. These can affect how your body processes dietary fats, triglycerides, and cholesterol. Heart attack and stroke are both medical emergencies. There are many things that you can do to help prevent heart disease and stroke:  Have your blood pressure checked at least every 1-2 years. High blood pressure causes heart disease and increases the risk of stroke.  If you are 59-22 years old, ask your health care provider if you should take  aspirin to prevent a heart attack or a stroke.  Do not use any tobacco products, including cigarettes, chewing tobacco, or electronic cigarettes. If you need help quitting, ask your health care provider.  It is important to eat a healthy diet and maintain a healthy weight. ? Be sure to include plenty of vegetables, fruits, low-fat dairy products, and lean protein. ? Avoid  eating foods that are high in solid fats, added sugars, or salt (sodium).  Get regular exercise. This is one of the most important things that you can do for your health. ? Try to exercise for at least 150 minutes each week. The type of exercise that you do should increase your heart rate and make you sweat. This is known as moderate-intensity exercise. ? Try to do strengthening exercises at least twice each week. Do these in addition to the moderate-intensity exercise.  Know your numbers.Ask your health care provider to check your cholesterol and your blood glucose. Continue to have your blood tested as directed by your health care provider.  What should I know about cancer screening? There are several types of cancer. Take the following steps to reduce your risk and to catch any cancer development as early as possible. Breast Cancer  Practice breast self-awareness. ? This means understanding how your breasts normally appear and feel. ? It also means doing regular breast self-exams. Let your health care provider know about any changes, no matter how small.  If you are 74 or older, have a clinician do a breast exam (clinical breast exam or CBE) every year. Depending on your age, family history, and medical history, it may be recommended that you also have a yearly breast X-ray (mammogram).  If you have a family history of breast cancer, talk with your health care provider about genetic screening.  If you are at high risk for breast cancer, talk with your health care provider about having an MRI and a mammogram every year.  Breast cancer (BRCA) gene test is recommended for women who have family members with BRCA-related cancers. Results of the assessment will determine the need for genetic counseling and BRCA1 and for BRCA2 testing. BRCA-related cancers include these types: ? Breast. This occurs in males or females. ? Ovarian. ? Tubal. This may also be called fallopian tube cancer. ? Cancer  of the abdominal or pelvic lining (peritoneal cancer). ? Prostate. ? Pancreatic.  Cervical, Uterine, and Ovarian Cancer Your health care provider may recommend that you be screened regularly for cancer of the pelvic organs. These include your ovaries, uterus, and vagina. This screening involves a pelvic exam, which includes checking for microscopic changes to the surface of your cervix (Pap test).  For women ages 21-65, health care providers may recommend a pelvic exam and a Pap test every three years. For women ages 63-65, they may recommend the Pap test and pelvic exam, combined with testing for human papilloma virus (HPV), every five years. Some types of HPV increase your risk of cervical cancer. Testing for HPV may also be done on women of any age who have unclear Pap test results.  Other health care providers may not recommend any screening for nonpregnant women who are considered low risk for pelvic cancer and have no symptoms. Ask your health care provider if a screening pelvic exam is right for you.  If you have had past treatment for cervical cancer or a condition that could lead to cancer, you need Pap tests and screening for cancer for at least 20  years after your treatment. If Pap tests have been discontinued for you, your risk factors (such as having a new sexual partner) need to be reassessed to determine if you should start having screenings again. Some women have medical problems that increase the chance of getting cervical cancer. In these cases, your health care provider may recommend that you have screening and Pap tests more often.  If you have a family history of uterine cancer or ovarian cancer, talk with your health care provider about genetic screening.  If you have vaginal bleeding after reaching menopause, tell your health care provider.  There are currently no reliable tests available to screen for ovarian cancer.  Lung Cancer Lung cancer screening is recommended for  adults 61-13 years old who are at high risk for lung cancer because of a history of smoking. A yearly low-dose CT scan of the lungs is recommended if you:  Currently smoke.  Have a history of at least 30 pack-years of smoking and you currently smoke or have quit within the past 15 years. A pack-year is smoking an average of one pack of cigarettes per day for one year.  Yearly screening should:  Continue until it has been 15 years since you quit.  Stop if you develop a health problem that would prevent you from having lung cancer treatment.  Colorectal Cancer  This type of cancer can be detected and can often be prevented.  Routine colorectal cancer screening usually begins at age 37 and continues through age 25.  If you have risk factors for colon cancer, your health care provider may recommend that you be screened at an earlier age.  If you have a family history of colorectal cancer, talk with your health care provider about genetic screening.  Your health care provider may also recommend using home test kits to check for hidden blood in your stool.  A small camera at the end of a tube can be used to examine your colon directly (sigmoidoscopy or colonoscopy). This is done to check for the earliest forms of colorectal cancer.  Direct examination of the colon should be repeated every 5-10 years until age 43. However, if early forms of precancerous polyps or small growths are found or if you have a family history or genetic risk for colorectal cancer, you may need to be screened more often.  Skin Cancer  Check your skin from head to toe regularly.  Monitor any moles. Be sure to tell your health care provider: ? About any new moles or changes in moles, especially if there is a change in a mole's shape or color. ? If you have a mole that is larger than the size of a pencil eraser.  If any of your family members has a history of skin cancer, especially at a young age, talk with your  health care provider about genetic screening.  Always use sunscreen. Apply sunscreen liberally and repeatedly throughout the day.  Whenever you are outside, protect yourself by wearing long sleeves, pants, a wide-brimmed hat, and sunglasses.  What should I know about osteoporosis? Osteoporosis is a condition in which bone destruction happens more quickly than new bone creation. After menopause, you may be at an increased risk for osteoporosis. To help prevent osteoporosis or the bone fractures that can happen because of osteoporosis, the following is recommended:  If you are 69-73 years old, get at least 1,000 mg of calcium and at least 600 mg of vitamin D per day.  If you are  older than age 10 but younger than age 29, get at least 1,200 mg of calcium and at least 600 mg of vitamin D per day.  If you are older than age 84, get at least 1,200 mg of calcium and at least 800 mg of vitamin D per day.  Smoking and excessive alcohol intake increase the risk of osteoporosis. Eat foods that are rich in calcium and vitamin D, and do weight-bearing exercises several times each week as directed by your health care provider. What should I know about how menopause affects my mental health? Depression may occur at any age, but it is more common as you become older. Common symptoms of depression include:  Low or sad mood.  Changes in sleep patterns.  Changes in appetite or eating patterns.  Feeling an overall lack of motivation or enjoyment of activities that you previously enjoyed.  Frequent crying spells.  Talk with your health care provider if you think that you are experiencing depression. What should I know about immunizations? It is important that you get and maintain your immunizations. These include:  Tetanus, diphtheria, and pertussis (Tdap) booster vaccine.  Influenza every year before the flu season begins.  Pneumonia vaccine.  Shingles vaccine.  Your health care provider may  also recommend other immunizations. This information is not intended to replace advice given to you by your health care provider. Make sure you discuss any questions you have with your health care provider. Document Released: 12/19/2005 Document Revised: 05/16/2016 Document Reviewed: 07/31/2015 Elsevier Interactive Patient Education  2018 [employer].

## 2018-09-06 ENCOUNTER — Other Ambulatory Visit (INDEPENDENT_AMBULATORY_CARE_PROVIDER_SITE_OTHER): Payer: PPO

## 2018-09-06 ENCOUNTER — Other Ambulatory Visit: Payer: Self-pay | Admitting: Family

## 2018-09-06 DIAGNOSIS — R748 Abnormal levels of other serum enzymes: Secondary | ICD-10-CM

## 2018-09-06 DIAGNOSIS — Z0000 Encounter for general adult medical examination without abnormal findings: Secondary | ICD-10-CM | POA: Diagnosis not present

## 2018-09-06 LAB — COMPREHENSIVE METABOLIC PANEL
ALT: 17 U/L (ref 0–35)
AST: 27 U/L (ref 0–37)
Albumin: 3.7 g/dL (ref 3.5–5.2)
Alkaline Phosphatase: 135 U/L — ABNORMAL HIGH (ref 39–117)
BILIRUBIN TOTAL: 0.4 mg/dL (ref 0.2–1.2)
BUN: 14 mg/dL (ref 6–23)
CO2: 28 meq/L (ref 19–32)
CREATININE: 0.75 mg/dL (ref 0.40–1.20)
Calcium: 9.3 mg/dL (ref 8.4–10.5)
Chloride: 104 mEq/L (ref 96–112)
GFR: 81.4 mL/min (ref >60.00)
GLUCOSE: 94 mg/dL (ref 70–99)
Potassium: 4.3 mEq/L (ref 3.5–5.1)
Sodium: 138 mEq/L (ref 135–145)
Total Protein: 6.8 g/dL (ref 6.0–8.3)

## 2018-09-06 LAB — CBC WITH DIFFERENTIAL/PLATELET
BASOS ABS: 0.1 10*3/uL (ref 0.0–0.1)
BASOS PCT: 1.3 % (ref 0.0–3.0)
Eosinophils Absolute: 0.3 10*3/uL (ref 0.0–0.7)
Eosinophils Relative: 7.4 % — ABNORMAL HIGH (ref 0.0–5.0)
HEMATOCRIT: 38 % (ref 36.0–46.0)
Hemoglobin: 12.4 g/dL (ref 12.0–15.0)
LYMPHS PCT: 45.6 % (ref 12.0–46.0)
Lymphs Abs: 2.1 10*3/uL (ref 0.7–4.0)
MCHC: 32.5 g/dL (ref 30.0–36.0)
MCV: 73.6 fl — AB (ref 78.0–100.0)
MONOS PCT: 9.2 % (ref 3.0–12.0)
Monocytes Absolute: 0.4 10*3/uL (ref 0.1–1.0)
NEUTROS ABS: 1.7 10*3/uL (ref 1.4–7.7)
NEUTROS PCT: 36.5 % — AB (ref 43.0–77.0)
PLATELETS: 304 10*3/uL (ref 150.0–400.0)
RBC: 5.16 Mil/uL — ABNORMAL HIGH (ref 3.87–5.11)
RDW: 15.3 % (ref 11.5–15.5)
WBC: 4.6 10*3/uL (ref 4.0–10.5)

## 2018-09-06 LAB — LIPID PANEL
Cholesterol: 130 mg/dL (ref 0–200)
HDL: 56.3 mg/dL (ref >39.00)
LDL Cholesterol: 63 mg/dL (ref 0–99)
NONHDL: 73.57
Total CHOL/HDL Ratio: 2
Triglycerides: 52 mg/dL (ref 0.0–149.0)
VLDL: 10.4 mg/dL (ref 0.0–40.0)

## 2018-09-06 LAB — VITAMIN D 25 HYDROXY (VIT D DEFICIENCY, FRACTURES): VITD: 45.26 ng/mL (ref 30.00–100.00)

## 2018-09-06 LAB — TSH: TSH: 2.87 u[IU]/mL (ref 0.35–4.50)

## 2018-09-06 LAB — HEMOGLOBIN A1C: Hgb A1c MFr Bld: 6.3 % (ref 4.6–6.5)

## 2018-09-07 ENCOUNTER — Encounter: Payer: Self-pay | Admitting: Family

## 2018-09-07 ENCOUNTER — Other Ambulatory Visit: Payer: PPO

## 2018-09-07 DIAGNOSIS — H2513 Age-related nuclear cataract, bilateral: Secondary | ICD-10-CM | POA: Diagnosis not present

## 2018-09-08 ENCOUNTER — Telehealth: Payer: Self-pay | Admitting: Family

## 2018-09-08 ENCOUNTER — Other Ambulatory Visit: Payer: Self-pay | Admitting: Family

## 2018-09-08 DIAGNOSIS — R748 Abnormal levels of other serum enzymes: Secondary | ICD-10-CM

## 2018-09-08 NOTE — Telephone Encounter (Signed)
Left voice mail for patient to call back ok for [facility] to speak to patient , regarding below message    

## 2018-09-08 NOTE — Telephone Encounter (Signed)
Called pt to discuss plan regarding labs as she sent mychart message  Asked her to call back and left vm  When she calls back , let her know the following:   I spoke with hematology today and he advised that we pursue a GI evaluation as alk phos can be a liver/gallbladder origin.   I have ordered ultrasound of abdomen and also placed a referral to GI.   Furthermore, we are working on referral to hematology/oncology for genetics consult  Please advise a 3 month f/u with me so we can regroup after all these appts/ referrals, sooner of course if any concerns

## 2018-09-13 ENCOUNTER — Telehealth: Payer: Self-pay

## 2018-09-13 NOTE — Telephone Encounter (Signed)
Pt given results per notes of Brittany Paris FNP on 09/08/18. Unable to document in result note due to result note not being routed to [facility]. 3 month follow up appointment made.

## 2018-09-13 NOTE — Telephone Encounter (Signed)
Copied from [place] [id number]. Topic: General - Inquiry >> Sep 13, 2018  3:20 PM Rutherford Nail, [place] wrote: Reason for CRM: Patient calling and states that [facility] are needing the x-rays from 09/03/18 placed on a disk. States that she would like the disk mailed to her address (verified) once the x-rays are on it. Please advise.

## 2018-09-13 NOTE — Telephone Encounter (Signed)
Disks cant be mailed. CD can be made and patient can come to pick up CD.

## 2018-09-13 NOTE — Telephone Encounter (Signed)
mychart message has been sent to inform patient. 

## 2018-09-15 NOTE — Telephone Encounter (Signed)
Detailed message has been left on VM informing patient to pick up disc at front desk of xrays.

## 2018-09-15 NOTE — Telephone Encounter (Signed)
CD made and placed up front for patient to pick up. Please call.

## 2018-09-17 ENCOUNTER — Inpatient Hospital Stay: Payer: PPO | Attending: Internal Medicine | Admitting: Internal Medicine

## 2018-09-17 ENCOUNTER — Encounter: Payer: Self-pay | Admitting: Internal Medicine

## 2018-09-17 ENCOUNTER — Other Ambulatory Visit: Payer: Self-pay

## 2018-09-17 ENCOUNTER — Inpatient Hospital Stay: Payer: PPO

## 2018-09-17 VITALS — BP 146/85 | HR 59 | Temp 97.9°F | Resp 16 | Wt 173.0 lb

## 2018-09-17 DIAGNOSIS — R748 Abnormal levels of other serum enzymes: Secondary | ICD-10-CM | POA: Insufficient documentation

## 2018-09-17 DIAGNOSIS — Z803 Family history of malignant neoplasm of breast: Secondary | ICD-10-CM | POA: Insufficient documentation

## 2018-09-17 DIAGNOSIS — Z8042 Family history of malignant neoplasm of prostate: Secondary | ICD-10-CM | POA: Diagnosis not present

## 2018-09-17 DIAGNOSIS — Z801 Family history of malignant neoplasm of trachea, bronchus and lung: Secondary | ICD-10-CM | POA: Diagnosis not present

## 2018-09-17 DIAGNOSIS — Z79899 Other long term (current) drug therapy: Secondary | ICD-10-CM | POA: Diagnosis not present

## 2018-09-17 LAB — GAMMA GT: GGT: 10 U/L (ref 7–50)

## 2018-09-17 NOTE — Progress Notes (Signed)
[place] CONSULT NOTE  Patient Care Team: Burnard Hawthorne, FNP as PCP - General (Family Medicine)  CHIEF COMPLAINTS/PURPOSE OF CONSULTATION:  Elevated Alkaline Phosphatse  #May 2018-elevated alkaline phosphatase  #Microcytosis without anemia-question underlying thalassemia.  No history exists.     HISTORY OF PRESENTING ILLNESS:  Brittany Farley 67 y.o.  female with no significant past medical history has been referred to [place] for further evaluation recommendations for elevated alkaline phosphatase/family history of cancers.  Patient denies any abdominal pain nausea vomiting.  Denies any chest pain or shortness of breath or cough.  No bone pain.   Patient was initially noted to have elevated alk phosphatase in May 2018; most recently alkaline phosphatase was elevated at 135/although improved from baseline.  Patient is awaiting GI work-up including consultation/ultrasound next month.  Review of Systems  Constitutional: Negative for chills, diaphoresis, fever, malaise/fatigue and weight loss.  HENT: Negative for nosebleeds and sore throat.   Eyes: Negative for double vision.  Respiratory: Negative for cough, hemoptysis, sputum production, shortness of breath and wheezing.   Cardiovascular: Negative for chest pain, palpitations, orthopnea and leg swelling.  Gastrointestinal: Negative for abdominal pain, blood in stool, constipation, diarrhea, heartburn, melena, nausea and vomiting.  Genitourinary: Negative for dysuria, frequency and urgency.  Musculoskeletal: Negative for back pain and joint pain.  Skin: Negative.  Negative for itching and rash.  Neurological: Negative for dizziness, tingling, focal weakness, weakness and headaches.  Endo/Heme/Allergies: Does not bruise/bleed easily.  Psychiatric/Behavioral: Negative for depression. The patient is not nervous/anxious and does not have insomnia.      MEDICAL HISTORY:  Past Medical History:  Diagnosis Date  .  Arthritis     SURGICAL HISTORY: Past Surgical History:  Procedure Laterality Date  . ABDOMINAL HYSTERECTOMY  1980  . CHOLECYSTECTOMY  2011    SOCIAL HISTORY: Social History   Socioeconomic History  . Marital status: Single    Spouse name: Not on file  . Number of children: Not on file  . Years of education: Not on file  . Highest education level: Not on file  Occupational History  . Not on file  Social Needs  . Financial resource strain: Not on file  . Food insecurity:    Worry: Not on file    Inability: Not on file  . Transportation needs:    Medical: Not on file    Non-medical: Not on file  Tobacco Use  . Smoking status: Never Smoker  . Smokeless tobacco: Never Used  Substance and Sexual Activity  . Alcohol use: Yes    Comment: occasional  . Drug use: No  . Sexual activity: Not on file  Lifestyle  . Physical activity:    Days per week: Not on file    Minutes per session: Not on file  . Stress: Not on file  Relationships  . Social connections:    Talks on phone: Not on file    Gets together: Not on file    Attends religious service: Not on file    Active member of club or organization: Not on file    Attends meetings of clubs or organizations: Not on file    Relationship status: Not on file  . Intimate partner violence:    Fear of current or ex partner: Not on file    Emotionally abused: Not on file    Physically abused: Not on file    Forced sexual activity: Not on file  Other Topics Concern  . 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        FAMILY HISTORY: Family History  Problem Relation Age of Onset  . Breast cancer Mother 71  . Breast cancer Maternal Aunt 70  . Lung cancer Sister   . Diabetes Sister   . Prostate cancer Brother   . Heart disease Brother   . Stroke Cousin   . Hypertension Cousin     ALLERGIES:  has No Known  Allergies.  MEDICATIONS:  Current Outpatient Medications  Medication Sig Dispense Refill  . aspirin EC 81 MG tablet Take 81 mg by mouth daily.    Marland Kitchen atorvastatin (LIPITOR) 10 MG tablet Take 1 tablet (10 mg total) by mouth daily. 90 tablet 1  . Cholecalciferol (VITAMIN D3) 2000 units TABS Take by mouth.     No current facility-administered medications for this visit.       Marland Kitchen  PHYSICAL EXAMINATION: ECOG PERFORMANCE STATUS: 0 - Asymptomatic  Vitals:   09/17/18 1100  BP: (!) 146/85  Pulse: (!) 59  Resp: 16  Temp: 97.9 F (36.6 C)   Filed Weights   09/17/18 1100  Weight: 173 lb (78.5 kg)    Physical Exam  Constitutional: She is oriented to person, place, and time and well-developed, well-nourished, and in no distress.  HENT:  Head: Normocephalic and atraumatic.  Mouth/Throat: Oropharynx is clear and moist. No oropharyngeal exudate.  Eyes: Pupils are equal, round, and reactive to light.  Neck: Normal range of motion. Neck supple.  Cardiovascular: Normal rate and regular rhythm.  Pulmonary/Chest: No respiratory distress. She has no wheezes.  Abdominal: Soft. Bowel sounds are normal. She exhibits no distension and no mass. There is no tenderness. There is no rebound and no guarding.  Musculoskeletal: Normal range of motion. She exhibits no edema or tenderness.  Neurological: She is alert and oriented to person, place, and time.  Skin: Skin is warm.  Psychiatric: Affect normal.     LABORATORY DATA:  I have reviewed the data as listed Lab Results  Component Value Date   WBC 4.6 09/06/2018   HGB 12.4 09/06/2018   HCT 38.0 09/06/2018   MCV 73.6 (L) 09/06/2018   PLT 304.0 09/06/2018   Recent Labs    09/06/18 0752  NA 138  K 4.3  CL 104  CO2 28  GLUCOSE 94  BUN 14  CREATININE 0.75  CALCIUM 9.3  PROT 6.8  ALBUMIN 3.7  AST 27  ALT 17  ALKPHOS 135*  BILITOT 0.4    RADIOGRAPHIC STUDIES: I have personally reviewed the radiological images as listed and agreed  with the findings in the report. Dg Shoulder Right  Result Date: 09/04/2018 CLINICAL DATA:  Pain. EXAM: RIGHT SHOULDER - 2+ VIEW COMPARISON:  None. FINDINGS: Minimal degenerative changes.  No fracture or dislocation. IMPRESSION: Minimal degenerative changes. Electronically Signed   By: Dorise Bullion III M.D   On: 09/04/2018 20:09   Dg Shoulder Left  Result Date: 09/04/2018 CLINICAL DATA:  Pain. EXAM: LEFT SHOULDER - 2+ VIEW COMPARISON:  None. FINDINGS: Mild degenerative changes with tiny osteophytes off the medial humeral head and glenoid. No fracture dislocation. IMPRESSION: Mild degenerative changes. Electronically Signed   By: Dorise Bullion III M.D   On: 09/04/2018 20:09    ASSESSMENT & PLAN:   Elevated alkaline phosphatase level #Elevated alkaline phosphatase-more than 1 year; most recent 135/improved.  The etiology  is unclear.  Recommend repeat alkaline phosphatase; isoenzymes.  Discussed multiple etiologies including-bone liver intestinal causes.  Clinically I doubt any malignant cause of elevated alkaline phosphatase.  Patient awaiting GI evaluation next month.  #Screening -patient up-to-date with mammograms; awaiting repeat mammogram in December.  However patient never had colonoscopy -with would strongly recommend given her age.  Patient had recent Cologuard testing which was negative.  Recommend talking to GI regarding colonoscopy at the next visit.  # Family History of cancer- mother- breast cancer in 63s [died of alzheimers at 37]; dad-side Unknown; mother's sister died of breast cancer [80s]; younger uncles x2 - prostate cancer [in 81s]; pt's brother -prostate cancer [ 42years]. No ovarian/endometrial cancers/ melanomas.  Discussed that no obvious concerns for genetic predisposition to malignancy.  However given multiple malignancies/patient's anxiety will make a referral to genetics.   Thank you Ms.Arnett NP for allowing me to participate in the care of your pleasant patient.  Please do not hesitate to contact me with questions or concerns in the interim.  # DISPOSITION: # labs today; will call with results- [phone] # follow up TBD- Dr.B  All questions were answered. The patient knows to call the clinic with any problems, questions or concerns.    Cammie Sickle, MD 09/17/2018 12:15 PM

## 2018-09-17 NOTE — Assessment & Plan Note (Addendum)
#  Elevated alkaline phosphatase-more than 1 year; most recent 135/improved.  The etiology is unclear.  Recommend repeat alkaline phosphatase; isoenzymes.  Discussed multiple etiologies including-bone liver intestinal causes.  Clinically I doubt any malignant cause of elevated alkaline phosphatase.  Patient awaiting GI evaluation next month.  #Screening -patient up-to-date with mammograms; awaiting repeat mammogram in December.  However patient never had colonoscopy -with would strongly recommend given her age.  Patient had recent Cologuard testing which was negative.  Recommend talking to GI regarding colonoscopy at the next visit.  # Family History of cancer- mother- breast cancer in 58s [died of alzheimers at 21]; dad-side Unknown; mother's sister died of breast cancer [80s]; younger uncles x2 - prostate cancer [in 66s]; pt's brother -prostate cancer [ 73years]. No ovarian/endometrial cancers/ melanomas.  Discussed that no obvious concerns for genetic predisposition to malignancy.  However given multiple malignancies/patient's anxiety will make a referral to genetics.   Thank you Ms.Arnett NP for allowing me to participate in the care of your pleasant patient. Please do not hesitate to contact me with questions or concerns in the interim.  # DISPOSITION: # labs today; will call with results- [phone] # follow up TBD- Dr.B

## 2018-09-20 LAB — ALKALINE PHOSPHATASE, ISOENZYMES
ALK PHOS BONE FRACT: 58 % (ref 14–68)
ALK PHOS LIVER FRACT: 42 % (ref 18–85)
Alk Phos: 158 IU/L — ABNORMAL HIGH (ref 39–117)
Intestinal %: 0 % (ref 0–18)

## 2018-09-24 ENCOUNTER — Encounter: Payer: Self-pay | Admitting: Internal Medicine

## 2018-09-24 ENCOUNTER — Telehealth: Payer: Self-pay | Admitting: Internal Medicine

## 2018-09-24 NOTE — Telephone Encounter (Signed)
LVM to call us back to discuss the results of the blood work

## 2018-09-28 DIAGNOSIS — M7540 Impingement syndrome of unspecified shoulder: Secondary | ICD-10-CM | POA: Insufficient documentation

## 2018-09-28 DIAGNOSIS — M7542 Impingement syndrome of left shoulder: Secondary | ICD-10-CM | POA: Diagnosis not present

## 2018-09-28 DIAGNOSIS — M7541 Impingement syndrome of right shoulder: Secondary | ICD-10-CM | POA: Diagnosis not present

## 2018-10-13 ENCOUNTER — Ambulatory Visit
Admission: RE | Admit: 2018-10-13 | Discharge: 2018-10-13 | Disposition: A | Discharge status: Home / Self Care | Payer: PPO | Source: Ambulatory Visit | Attending: Family | Admitting: Family

## 2018-10-13 DIAGNOSIS — Z0000 Encounter for general adult medical examination without abnormal findings: Secondary | ICD-10-CM | POA: Insufficient documentation

## 2018-10-13 DIAGNOSIS — M810 Age-related osteoporosis without current pathological fracture: Secondary | ICD-10-CM | POA: Diagnosis not present

## 2018-10-13 DIAGNOSIS — Z780 Asymptomatic menopausal state: Secondary | ICD-10-CM | POA: Diagnosis not present

## 2018-10-13 DIAGNOSIS — M85851 Other specified disorders of bone density and structure, right thigh: Secondary | ICD-10-CM | POA: Insufficient documentation

## 2018-10-13 DIAGNOSIS — Z13820 Encounter for screening for osteoporosis: Secondary | ICD-10-CM | POA: Insufficient documentation

## 2018-10-13 DIAGNOSIS — Z1231 Encounter for screening mammogram for malignant neoplasm of breast: Secondary | ICD-10-CM | POA: Insufficient documentation

## 2018-10-15 ENCOUNTER — Telehealth: Payer: Self-pay | Admitting: *Deleted

## 2018-10-15 DIAGNOSIS — E785 Hyperlipidemia, unspecified: Secondary | ICD-10-CM

## 2018-10-15 NOTE — Telephone Encounter (Signed)
Please advise on referral

## 2018-10-15 NOTE — Telephone Encounter (Signed)
Copied from [place] [id number]. Topic: General - Other >> Oct 15, 2018  8:33 AM Oneta Rack wrote: Relation to pt: self  Call back number: [phone]   Reason for call:  Requesting Nutrition referral due to her cholesterol being high, patient states it was discuss at the last OV with PCP on 09/03/18. Please advise patient when referral is placed

## 2018-10-17 NOTE — Telephone Encounter (Signed)
Melissa,  I meant to send this referral in during an OV with patient and forgot  How soon can we get her in with nutrition?

## 2018-10-19 ENCOUNTER — Telehealth: Payer: Self-pay | Admitting: Internal Medicine

## 2018-10-19 NOTE — Telephone Encounter (Signed)
Brittany Farley/Brittany Farley-please inform patient her alkaline phosphatase continues to be slightly elevated.  The etiology is unclear.  Question related to GI/liver issues await consultation with the GI doctor as planned. thx

## 2018-10-19 NOTE — Telephone Encounter (Signed)
My chart msg sent to patient. 

## 2018-10-27 ENCOUNTER — Ambulatory Visit (INDEPENDENT_AMBULATORY_CARE_PROVIDER_SITE_OTHER): Payer: PPO | Admitting: Gastroenterology

## 2018-10-27 ENCOUNTER — Encounter: Payer: Self-pay | Admitting: Gastroenterology

## 2018-10-27 VITALS — BP 137/72 | HR 65 | Ht 65.0 in | Wt 176.6 lb

## 2018-10-27 DIAGNOSIS — R748 Abnormal levels of other serum enzymes: Secondary | ICD-10-CM | POA: Diagnosis not present

## 2018-10-27 NOTE — Progress Notes (Signed)
Brittany Farley [address]  [place], [place] [address]  Main: [phone]  Fax: [phone]   Gastroenterology Consultation  Referring Provider:     Burnard Hawthorne, FNP Primary Care Physician:  Burnard Hawthorne, FNP Primary Gastroenterologist:  Dr. Vonda Farley Reason for Consultation:    Elevated alk phos        HPI:    Chief Complaint  Patient presents with  . New Patient (Initial Visit)    referral from M. Arnette for liver & kidney scan per pt.     Brittany Farley is a 67 y.o. y/o female referred for consultation & management  by Dr. Vidal Schwalbe, Yvetta Coder, FNP.  Patient is found to have elevated alk phos chronically.  Otherwise liver enzymes are normal.  Most recent work-up on September 17, 2018 shows normal GGT and alk phos of 158.  In addition, alk phos fractionation shows a higher bone fractionation of 58% compared to liver fractionation at 42%.  Patient has osteoporosis based on bone scan done recently.  Patient denies any previous history of hepatitis.  Reports occasional alcohol use once or twice a month, 1-2 drinks in 1 sitting.  Was tested for hepatitis C last year and was negative. The patient denies abdominal or flank pain, anorexia, nausea or vomiting, dysphagia, change in bowel habits or black or bloody stools or weight loss.  Denies any over-the-counter or herbal products.  Past Medical History:  Diagnosis Date  . Arthritis     Past Surgical History:  Procedure Laterality Date  . ABDOMINAL HYSTERECTOMY  1980  . CHOLECYSTECTOMY  2011  . OOPHORECTOMY      Prior to Admission medications   Medication Sig Start Date End Date Taking? Authorizing Provider  aspirin EC 81 MG tablet Take 81 mg by mouth daily.   Yes [provider]  atorvastatin (LIPITOR) 10 MG tablet Take 1 tablet (10 mg total) by mouth daily. 06/11/18  Yes Arnett, Yvetta Coder, FNP  calcium carbonate (OSCAL) 1500 (600 Ca) MG TABS tablet Take 600 mg of elemental  calcium by mouth 2 (two) times daily with a meal.   Yes [provider]  Cholecalciferol (VITAMIN D3) 2000 units TABS Take by mouth.   Yes [provider]    Family History  Problem Relation Age of Onset  . Breast cancer Mother 66  . Breast cancer Maternal Aunt 70  . Lung cancer Sister   . Diabetes Sister   . Prostate cancer Brother   . Heart disease Brother   . Stroke Cousin   . Hypertension Cousin      Social History   Tobacco Use  . Smoking status: Never Smoker  . Smokeless tobacco: Never Used  Substance Use Topics  . Alcohol use: Yes    Comment: occasional  . Drug use: No    Allergies as of 10/27/2018  . (No Known Allergies)    Review of Systems:    All systems reviewed and negative except where noted in HPI.   Physical Exam:  BP 137/72   Pulse 65   Ht '5\' 5"'$  (1.651 m)   Wt 176 lb 9.6 oz (80.1 kg)   BMI 29.39 kg/m  No LMP recorded. Patient is postmenopausal. Psych:  Alert and cooperative. Normal mood and affect. General:   Alert,  Well-developed, well-nourished, pleasant and cooperative in NAD Head:  Normocephalic and atraumatic. Eyes:  Sclera clear, no icterus.   Conjunctiva pink. Ears:  Normal auditory acuity. Nose:  No deformity, discharge, or lesions. Mouth:  No deformity or lesions,oropharynx pink & moist. Neck:  Supple; no masses or thyromegaly. Abdomen:  Normal bowel sounds.  No bruits.  Soft, non-tender and non-distended without masses, hepatosplenomegaly or hernias noted.  No guarding or rebound tenderness.    Msk:  Symmetrical without gross deformities. Good, equal movement & strength bilaterally. Pulses:  Normal pulses noted. Extremities:  No clubbing or edema.  No cyanosis. Neurologic:  Alert and oriented x3;  grossly normal neurologically. Skin:  Intact without significant lesions or rashes. No jaundice. Lymph Nodes:  No significant cervical adenopathy. Psych:  Alert and cooperative. Normal mood and affect.   Labs: CBC      Component Value Date/Time   WBC 4.6 09/06/2018 0752   RBC 5.16 (H) 09/06/2018 0752   HGB 12.4 09/06/2018 0752   HCT 38.0 09/06/2018 0752   PLT 304.0 09/06/2018 0752   MCV 73.6 (L) 09/06/2018 0752   MCHC 32.5 09/06/2018 0752   RDW 15.3 09/06/2018 0752   LYMPHSABS 2.1 09/06/2018 0752   MONOABS 0.4 09/06/2018 0752   EOSABS 0.3 09/06/2018 0752   BASOSABS 0.1 09/06/2018 0752   CMP     Component Value Date/Time   NA 138 09/06/2018 0752   K 4.3 09/06/2018 0752   CL 104 09/06/2018 0752   CO2 28 09/06/2018 0752   GLUCOSE 94 09/06/2018 0752   BUN 14 09/06/2018 0752   CREATININE 0.75 09/06/2018 0752   CALCIUM 9.3 09/06/2018 0752   PROT 6.8 09/06/2018 0752   ALBUMIN 3.7 09/06/2018 0752   AST 27 09/06/2018 0752   ALT 17 09/06/2018 0752   ALKPHOS 135 (H) 09/06/2018 0752   BILITOT 0.4 09/06/2018 0752    Imaging Studies: Dg Bone Density  Result Date: 10/13/2018 EXAM: DUAL X-RAY ABSORPTIOMETRY (DXA) FOR BONE MINERAL DENSITY IMPRESSION: Technologist: SCE PATIENT BIOGRAPHICAL: Name: Brittany Farley, Brittany Farley Patient ID: [id number] Birth Date: 02-08-1951 Height: 63.5 in. Gender: Female Exam Date: 10/13/2018 Weight: 175.3 lbs. Indications: Hysterectomy, Oophorectomy Bilateral, Osteopenia, Parent Hip Fracture, Postmenopausal, Surgical Induced Menopause Fractures: Treatments: Calcium, Vitamin D ASSESSMENT: The BMD measured at Forearm Radius 33% is 0.590 g/cm2 with a T-score of -3.3. This patient is considered osteoporotic according to [facility]) criteria. The quality of the scan is good. Site Region Measured Measured WHO Young Adult BMD Date       Age      Classification T-score AP Spine L2-L3 10/13/2018 69.2 Normal 1.5 1.397 g/cm2 AP Spine L2-L3 06/18/2017 67.9 Normal 1.4 1.383 g/cm2 DualFemur Total Right 10/13/2018 69.2 Osteopenia -1.6 0.802 g/cm2 DualFemur Total Right 06/18/2017 67.9 Osteopenia -1.2 0.859 g/cm2 DualFemur Total Mean 10/13/2018 69.2 Osteopenia -1.3 0.842 g/cm2 DualFemur Total  Mean 06/18/2017 67.9 Normal -1.0 0.881 g/cm2 Left Forearm Radius 33% 10/13/2018 69.2 Osteoporosis -3.3 0.590 g/cm2 World Health Organization [facility]) criteria for post-menopausal, Caucasian Women: Normal:       T-score at or above -1 SD Osteopenia:   T-score between -1 and -2.5 SD Osteoporosis: T-score at or below -2.5 SD RECOMMENDATIONS: 1. All patients should optimize calcium and vitamin D intake. 2. Consider FDA-approved medical therapies in postmenopausal women and men aged 31 years and older, based on the following: a. A hip or vertebral(clinical or morphometric) fracture b. T-score < -2.5 at the femoral neck or spine after appropriate evaluation to exclude secondary causes c. Low bone mass (T-score between -1.0 and -2.5 at the femoral neck or spine) and a 10-year probability of a hip fracture > 3% or a 10-year probability  of a major osteoporosis-related fracture > 20% based on the [place]-adapted WHO algorithm d. Clinician judgment and/or patient preferences may indicate treatment for people with 10-year fracture probabilities above or below these levels FOLLOW-UP: People with diagnosed cases of osteoporosis or at high risk for fracture should have regular bone mineral density tests. For patients eligible for Medicare, routine testing is allowed once every 2 years. The testing frequency can be increased to one year for patients who have rapidly progressing disease, those who are receiving or discontinuing medical therapy to restore bone mass, or have additional risk factors. I have reviewed this report, and agree with the above findings. [facility] Radiology Electronically Signed   By: Ilona Sorrel M.D.   On: 10/13/2018 12:19   Mm 3d Screen Breast Bilateral  Result Date: 10/13/2018 CLINICAL DATA:  Screening. EXAM: DIGITAL SCREENING BILATERAL MAMMOGRAM WITH TOMO AND CAD COMPARISON:  Previous exam(s). ACR Breast Density Category b: There are scattered areas of fibroglandular density. FINDINGS: There are no findings  suspicious for malignancy. Images were processed with CAD. IMPRESSION: No mammographic evidence of malignancy. A result letter of this screening mammogram will be mailed directly to the patient. RECOMMENDATION: Screening mammogram in one year. (Code:SM-B-01Y) BI-RADS CATEGORY  1: Negative. Electronically Signed   By: Lillia Mountain M.D.   On: 10/13/2018 12:21    Assessment and Plan:   Brittany Farley is a 67 y.o. y/o female has been referred for elevated alk phos  Normal GGT, and alk phos fractionation are all consistent with elevated alk phos not being from liver source Given her history of osteoporosis, this is the likely source of her elevated alk phos Patient should follow-up with primary care provider closely in regard to her osteoporosis and treatment for the same  However, will will obtain liver ultrasound to rule out any liver lesions We will also obtain hepatitis testing to ensure that is normal as well If the above work-up is normal and she does not need any further liver work-up given etiology of elevated alk phos is from her osteoporosis  Patient has never had a screening colonoscopy and this was discussed in detail she is agreeable to proceeding with screening colonoscopy  I have discussed alternative options, risks & benefits,  which include, but are not limited to, bleeding, infection, perforation,respiratory complication & drug reaction.  The patient agrees with this plan & written consent will be obtained.       Dr Brittany Farley  Speech recognition software was used to dictate the above note.

## 2018-10-29 ENCOUNTER — Other Ambulatory Visit: Payer: Self-pay

## 2018-10-29 ENCOUNTER — Telehealth: Payer: Self-pay

## 2018-10-29 DIAGNOSIS — R748 Abnormal levels of other serum enzymes: Secondary | ICD-10-CM

## 2018-10-29 DIAGNOSIS — Z1211 Encounter for screening for malignant neoplasm of colon: Secondary | ICD-10-CM

## 2018-10-29 LAB — CERULOPLASMIN: Ceruloplasmin: 32 mg/dL (ref 19.0–39.0)

## 2018-10-29 LAB — HEPATITIS B CORE ANTIBODY, TOTAL: Hep B Core Total Ab: NEGATIVE

## 2018-10-29 LAB — FERRITIN: Ferritin: 20 ng/mL (ref 15–150)

## 2018-10-29 LAB — MITOCHONDRIAL/SMOOTH MUSCLE AB PNL
Mitochondrial Ab: <20 Units (ref 0.0–20.0)
Smooth Muscle Ab: 13 Units (ref 0–19)

## 2018-10-29 LAB — HEPATITIS B SURFACE ANTIBODY,QUALITATIVE: Hep B Surface Ab, Qual: NONREACTIVE

## 2018-10-29 LAB — HCV COMMENT:

## 2018-10-29 LAB — HEPATITIS B SURFACE ANTIGEN: HEP B S AG: NEGATIVE

## 2018-10-29 LAB — HEPATITIS C ANTIBODY (REFLEX)

## 2018-10-29 LAB — HEPATITIS A ANTIBODY, TOTAL: HEP A TOTAL AB: NEGATIVE

## 2018-10-29 NOTE — Telephone Encounter (Signed)
Pt missed Debbie's call a few minutes ago. Would like a call back

## 2018-10-29 NOTE — Telephone Encounter (Signed)
RUQ ultrasound scheduled for 11/05/2018 at 8am, arrival time 7:45am at the [facility] on [address] [place]. Nothing to eat or drink after midnight.

## 2018-11-01 ENCOUNTER — Telehealth: Payer: Self-pay

## 2018-11-01 NOTE — Telephone Encounter (Signed)
Tried to contact pt but phone rang and rang then nothing.

## 2018-11-01 NOTE — Telephone Encounter (Signed)
-----   Message from Virgel Manifold, MD sent at 10/29/2018  3:07 PM EST ----- Boyde Grieco please let patient know, her lab work does not show any evidence of hepatitis so far.  Continue with ultrasound as planned.

## 2018-11-05 ENCOUNTER — Ambulatory Visit
Admission: RE | Admit: 2018-11-05 | Discharge: 2018-11-05 | Disposition: A | Discharge status: Home / Self Care | Payer: PPO | Source: Ambulatory Visit | Attending: Gastroenterology | Admitting: Gastroenterology

## 2018-11-05 DIAGNOSIS — Z9049 Acquired absence of other specified parts of digestive tract: Secondary | ICD-10-CM | POA: Insufficient documentation

## 2018-11-05 DIAGNOSIS — K760 Fatty (change of) liver, not elsewhere classified: Secondary | ICD-10-CM | POA: Diagnosis not present

## 2018-11-05 DIAGNOSIS — R748 Abnormal levels of other serum enzymes: Secondary | ICD-10-CM | POA: Diagnosis not present

## 2018-11-05 DIAGNOSIS — R7989 Other specified abnormal findings of blood chemistry: Secondary | ICD-10-CM | POA: Diagnosis not present

## 2018-11-29 ENCOUNTER — Encounter: Payer: Self-pay | Admitting: Anesthesiology

## 2018-11-29 ENCOUNTER — Ambulatory Visit
Admission: RE | Admit: 2018-11-29 | Discharge: 2018-11-29 | Disposition: A | Discharge status: Home / Self Care | Payer: PPO | Attending: Gastroenterology | Admitting: Gastroenterology

## 2018-11-29 ENCOUNTER — Ambulatory Visit: Payer: PPO | Admitting: Anesthesiology

## 2018-11-29 ENCOUNTER — Encounter
Admission: RE | Disposition: A | Discharge status: Home / Self Care | Payer: Self-pay | Source: Home / Self Care | Attending: Gastroenterology

## 2018-11-29 DIAGNOSIS — M1990 Unspecified osteoarthritis, unspecified site: Secondary | ICD-10-CM | POA: Insufficient documentation

## 2018-11-29 DIAGNOSIS — D122 Benign neoplasm of ascending colon: Secondary | ICD-10-CM | POA: Diagnosis not present

## 2018-11-29 DIAGNOSIS — K635 Polyp of colon: Secondary | ICD-10-CM | POA: Insufficient documentation

## 2018-11-29 DIAGNOSIS — K6289 Other specified diseases of anus and rectum: Secondary | ICD-10-CM

## 2018-11-29 DIAGNOSIS — Z7982 Long term (current) use of aspirin: Secondary | ICD-10-CM | POA: Diagnosis not present

## 2018-11-29 DIAGNOSIS — Z79899 Other long term (current) drug therapy: Secondary | ICD-10-CM | POA: Insufficient documentation

## 2018-11-29 DIAGNOSIS — D120 Benign neoplasm of cecum: Secondary | ICD-10-CM | POA: Diagnosis not present

## 2018-11-29 DIAGNOSIS — Z1211 Encounter for screening for malignant neoplasm of colon: Secondary | ICD-10-CM | POA: Diagnosis not present

## 2018-11-29 DIAGNOSIS — K633 Ulcer of intestine: Secondary | ICD-10-CM | POA: Diagnosis not present

## 2018-11-29 HISTORY — PX: COLONOSCOPY WITH PROPOFOL: SHX5780

## 2018-11-29 SURGERY — COLONOSCOPY WITH PROPOFOL
Anesthesia: General

## 2018-11-29 MED ORDER — PROPOFOL 10 MG/ML IV BOLUS
INTRAVENOUS | Status: DC | PRN
  Administered 2018-11-29 (×2): 50 mg via INTRAVENOUS

## 2018-11-29 MED ORDER — PROPOFOL 500 MG/50ML IV EMUL
INTRAVENOUS | Status: DC | PRN
  Administered 2018-11-29: 125 ug/kg/min via INTRAVENOUS

## 2018-11-29 MED ORDER — LIDOCAINE HCL (CARDIAC) PF 100 MG/5ML IV SOSY
PREFILLED_SYRINGE | INTRAVENOUS | Status: DC | PRN
  Administered 2018-11-29: 30 mg via INTRAVENOUS

## 2018-11-29 MED ORDER — SODIUM CHLORIDE 0.9 % IV SOLN
INTRAVENOUS | Status: DC
  Administered 2018-11-29: 14:00:00 via INTRAVENOUS
  Administered 2018-11-29: 1000 mL via INTRAVENOUS

## 2018-11-29 MED ORDER — PROPOFOL 500 MG/50ML IV EMUL
INTRAVENOUS | Status: AC
  Filled 2018-11-29: qty 50

## 2018-11-29 MED ORDER — LIDOCAINE HCL (PF) 2 % IJ SOLN
INTRAMUSCULAR | Status: AC
  Filled 2018-11-29: qty 10

## 2018-11-29 NOTE — H&P (Signed)
Vonda Antigua, MD [address], [place], [place], [place], [address] [address], [place], [place], [place], [address] Phone: [phone]  Fax: [phone]  Primary Care Physician:  Burnard Hawthorne, FNP   Pre-Procedure History & Physical: HPI:  Brittany Farley is a 68 y.o. female is here for a colonoscopy.   Past Medical History:  Diagnosis Date  . Arthritis     Past Surgical History:  Procedure Laterality Date  . ABDOMINAL HYSTERECTOMY  1980  . CHOLECYSTECTOMY  2011  . OOPHORECTOMY      Prior to Admission medications   Medication Sig Start Date End Date Taking? Authorizing Provider  aspirin EC 81 MG tablet Take 81 mg by mouth daily.    [provider]  atorvastatin (LIPITOR) 10 MG tablet Take 1 tablet (10 mg total) by mouth daily. 06/11/18   Burnard Hawthorne, FNP  calcium carbonate (OSCAL) 1500 (600 Ca) MG TABS tablet Take 600 mg of elemental calcium by mouth 2 (two) times daily with a meal.    [provider]  Cholecalciferol (VITAMIN D3) 2000 units TABS Take by mouth.    [provider]    Allergies as of 10/29/2018  . (No Known Allergies)    Family History  Problem Relation Age of Onset  . Breast cancer Mother 45  . Breast cancer Maternal Aunt 70  . Lung cancer Sister   . Diabetes Sister   . Prostate cancer Brother   . Heart disease Brother   . Stroke Cousin   . Hypertension Cousin     Social History   Socioeconomic History  . Marital status: Single    Spouse name: Not on file  . Number of children: Not on file  . Years of education: Not on file  . Highest education level: Not on file  Occupational History  . Not on file  Social Needs  . Financial resource strain: Not on file  . Food insecurity:    Worry: Not on file    Inability: Not on file  . Transportation needs:    Medical: Not on file    Non-medical: Not on file  Tobacco Use  . Smoking status: Never Smoker  . Smokeless tobacco: Never Used  Substance  and Sexual Activity  . Alcohol use: Yes    Comment: occasional  . Drug use: No  . Sexual activity: Not on file  Lifestyle  . Physical activity:    Days per week: Not on file    Minutes per session: Not on file  . Stress: Not on file  Relationships  . Social connections:    Talks on phone: Not on file    Gets together: Not on file    Attends religious service: Not on file    Active member of club or organization: Not on file    Attends meetings of clubs or organizations: Not on file    Relationship status: Not on file  . Intimate partner violence:    Fear of current or ex partner: Not on file    Emotionally abused: Not on file    Physically abused: Not on file    Forced sexual activity: Not on file  Other Topics Concern  . 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        Review of Systems: See HPI, otherwise negative ROS  Physical Exam:  There were no vitals taken for this visit. General:   Alert,  pleasant and cooperative in NAD Head:  Normocephalic and atraumatic. Neck:  Supple; no masses or thyromegaly. Lungs:  Clear throughout to auscultation, normal respiratory effort.    Heart:  +S1, +S2, Regular rate and rhythm, No edema. Abdomen:  Soft, nontender and nondistended. Normal bowel sounds, without guarding, and without rebound.   Neurologic:  Alert and  oriented x4;  grossly normal neurologically.  Impression/Plan: Brittany Farley is here for a colonoscopy to be performed for average risk screening.  Risks, benefits, limitations, and alternatives regarding  colonoscopy have been reviewed with the patient.  Questions have been answered.  All parties agreeable.   Virgel Manifold, MD  11/29/2018, 12:46 PM

## 2018-11-29 NOTE — Anesthesia Preprocedure Evaluation (Signed)
Anesthesia Evaluation  Patient identified by MRN, date of birth, ID band Patient awake    Reviewed: Allergy & Precautions, NPO status , Patient's Chart, lab work & pertinent test results, reviewed documented beta blocker date and time   Airway Mallampati: II  TM Distance: >3 FB     Dental  (+) Chipped   Pulmonary           Cardiovascular      Neuro/Psych    GI/Hepatic   Endo/Other    Renal/GU      Musculoskeletal  (+) Arthritis ,   Abdominal   Peds  Hematology   Anesthesia Other Findings EF 60-65.  Reproductive/Obstetrics                             Anesthesia Physical Anesthesia Plan  ASA: II  Anesthesia Plan: General   Post-op Pain Management:    Induction: Intravenous  PONV Risk Score and Plan:   Airway Management Planned:   Additional Equipment:   Intra-op Plan:   Post-operative Plan:   Informed Consent: I have reviewed the patients History and Physical, chart, labs and discussed the procedure including the risks, benefits and alternatives for the proposed anesthesia with the patient or authorized representative who has indicated his/her understanding and acceptance.       Plan Discussed with: CRNA  Anesthesia Plan Comments:         Anesthesia Quick Evaluation

## 2018-11-29 NOTE — Anesthesia Post-op Follow-up Note (Signed)
Anesthesia QCDR form completed.        

## 2018-11-29 NOTE — Op Note (Signed)
[facility] Patient Name: Brittany Farley Procedure Date: 11/29/2018 1:21 PM MRN: [id number] Account #: [id number] Date of Birth: Sep 29, 1951 Admit Type: Outpatient Age: 68 Room: [facility] ENDO ROOM 2 Gender: Female Note Status: Finalized Procedure:            Colonoscopy Indications:          Screening for colorectal malignant neoplasm Providers:            Varnita B. Bonna Gains MD, MD Referring MD:         Yvetta Coder. Arnett (Referring MD) Medicines:            Monitored Anesthesia Care Complications:        No immediate complications. Procedure:            Pre-Anesthesia Assessment:                       - ASA Grade Assessment: II - A patient with mild                        systemic disease.                       - Prior to the procedure, a History and Physical was                        performed, and patient medications, allergies and                        sensitivities were reviewed. The patient's tolerance of                        previous anesthesia was reviewed.                       - The risks and benefits of the procedure and the                        sedation options and risks were discussed with the                        patient. All questions were answered and informed                        consent was obtained.                       - Patient identification and proposed procedure were                        verified prior to the procedure by the physician, the                        nurse, the anesthesiologist, the anesthetist and the                        technician. The procedure was verified in the procedure                        room.  After obtaining informed consent, the colonoscope was                        passed under direct vision. Throughout the procedure,                        the patient's blood pressure, pulse, and oxygen                        saturations were monitored continuously. The                  Colonoscope was introduced through the anus and                        advanced to the the cecum, identified by appendiceal                        orifice and ileocecal valve. The colonoscopy was                        performed with ease. The patient tolerated the                        procedure well. The quality of the bowel preparation                        was good. Findings:      The perianal and digital rectal examinations were normal.      A localized area of mildly erosion mucosa was found in the cecum.       Biopsies were taken with a cold forceps for histology.      Three sessile polyps were found in the ascending colon and cecum. The       polyps were 2 to 3 mm in size. These polyps were removed with a cold       biopsy forceps. Resection and retrieval were complete.      A 6 mm polyp was found in the ascending colon. The polyp was sessile.       The polyp was removed with a cold snare. Resection and retrieval were       complete.      The exam was otherwise without abnormality.      The rectum, sigmoid colon, descending colon, transverse colon, ascending       colon and cecum appeared normal.      A 5 mm polypoid lesion was found at the anus. The lesion was sessile. No       bleeding was present. This was below the anal verge and was not biopsied       and will need further evaluation under EUA by surgery. It may represent       benign hypertrophied anal mucosa.      Anal papilla(e) were hypertrophied.      No additional abnormalities were found on retroflexion. Impression:           - Erosion mucosa in the cecum. Biopsied.                       - Three 2 to 3 mm polyps in the ascending colon and in  the cecum, removed with a cold biopsy forceps. Resected                        and retrieved.                       - One 6 mm polyp in the ascending colon, removed with a                        cold snare. Resected and retrieved.                        - The examination was otherwise normal.                       - The rectum, sigmoid colon, descending colon,                        transverse colon, ascending colon and cecum are normal.                       - Likely benign polypoid lesion at the anus.                       - Anal papilla(e) were hypertrophied. Recommendation:       - Referral to Dr. Dahlia Byes to evaluate polyp vs                        hypertrophic anal papillae below anal verge.                       - Discharge patient to home (with escort).                       - Advance diet as tolerated.                       - Continue present medications.                       - Await pathology results.                       - Repeat colonoscopy date to be determined after                        pending pathology results are reviewed.                       - The findings and recommendations were discussed with                        the patient.                       - The findings and recommendations were discussed with                        the patient's family.                       - Return to primary care physician as previously  scheduled. Procedure Code(s):    --- Professional ---                       [phone], Colonoscopy, flexible; with removal of tumor(s),                        polyp(s), or other lesion(s) by snare technique                       45380, 52, Colonoscopy, flexible; with biopsy, single                        or multiple Diagnosis Code(s):    --- Professional ---                       Z12.11, Encounter for screening for malignant neoplasm                        of colon                       D12.2, Benign neoplasm of ascending colon                       D12.0, Benign neoplasm of cecum                       D49.0, Neoplasm of unspecified behavior of digestive                        system                       K62.89, Other specified diseases of anus and rectum CPT  copyright 2018 American Medical Association. All rights reserved. The codes documented in this report are preliminary and upon coder review may  be revised to meet current compliance requirements.  Vonda Antigua, MD Margretta Sidle B. Bonna Gains MD, MD 11/29/2018 2:11:48 PM This report has been signed electronically. Number of Addenda: 0 Note Initiated On: 11/29/2018 1:21 PM Scope Withdrawal Time: 0 hours 19 minutes 14 seconds  Total Procedure Duration: 0 hours 28 minutes 52 seconds  Estimated Blood Loss: Estimated blood loss: none.      [facility]

## 2018-11-29 NOTE — Anesthesia Postprocedure Evaluation (Signed)
Anesthesia Post Note  Patient: Brittany Farley  Procedure(s) Performed: COLONOSCOPY WITH PROPOFOL (N/A )  Patient location during evaluation: Endoscopy Anesthesia Type: General Level of consciousness: awake and alert Pain management: pain level controlled Vital Signs Assessment: post-procedure vital signs reviewed and stable Respiratory status: spontaneous breathing, nonlabored ventilation, respiratory function stable and patient connected to nasal cannula oxygen Cardiovascular status: blood pressure returned to baseline and stable Postop Assessment: no apparent nausea or vomiting Anesthetic complications: no     Last Vitals:  Vitals:   11/29/18 1420 11/29/18 1429  BP: 114/77 114/75  Pulse: 71 (!) 59  Resp: 16 17  Temp:    SpO2: 100% 100%    Last Pain:  Vitals:   11/29/18 1429  TempSrc:   PainSc: 0-No pain                 Tyge Somers S

## 2018-11-29 NOTE — Transfer of Care (Signed)
Immediate Anesthesia Transfer of Care Note  Patient: Brittany Farley  Procedure(s) Performed: COLONOSCOPY WITH PROPOFOL (N/A )  Patient Location: PACU and Endoscopy Unit  Anesthesia Type:General  Level of Consciousness: awake, alert  and oriented  Airway & Oxygen Therapy: Patient Spontanous Breathing  Post-op Assessment: Report given to RN  Post vital signs: Reviewed and stable  Last Vitals:  Vitals Value Taken Time  BP 97/65 11/29/2018  2:10 PM  Temp 36.1 C 11/29/2018  2:09 PM  Pulse 63 11/29/2018  2:11 PM  Resp 21 11/29/2018  2:11 PM  SpO2 100 % 11/29/2018  2:11 PM  Vitals shown include unvalidated device data.  Last Pain:  Vitals:   11/29/18 1409  TempSrc:   PainSc: (P) 0-No pain         Complications: No apparent anesthesia complications

## 2018-11-30 ENCOUNTER — Encounter: Payer: Self-pay | Admitting: Gastroenterology

## 2018-12-01 LAB — SURGICAL PATHOLOGY

## 2018-12-02 ENCOUNTER — Encounter: Payer: Self-pay | Admitting: Gastroenterology

## 2018-12-03 ENCOUNTER — Telehealth: Payer: Self-pay | Admitting: *Deleted

## 2018-12-03 ENCOUNTER — Telehealth: Payer: Self-pay

## 2018-12-03 NOTE — Telephone Encounter (Signed)
noted 

## 2018-12-03 NOTE — Telephone Encounter (Signed)
LMTCO.

## 2018-12-03 NOTE — Telephone Encounter (Signed)
Copied from [employer]. Topic: General - Other >> Dec 03, 2018 10:40 AM Yvette Rack wrote: Reason for CRM: Pt called in to advise [name] that she was informed that the next colonoscopy will be done in 10 years .

## 2018-12-03 NOTE — Telephone Encounter (Signed)
I have communicated with pt via mychart.

## 2018-12-15 ENCOUNTER — Ambulatory Visit: Payer: PPO | Admitting: Family

## 2018-12-17 ENCOUNTER — Ambulatory Visit (INDEPENDENT_AMBULATORY_CARE_PROVIDER_SITE_OTHER): Payer: PPO | Admitting: Family

## 2018-12-17 ENCOUNTER — Encounter: Payer: Self-pay | Admitting: Family

## 2018-12-17 VITALS — BP 130/82 | HR 63 | Ht 65.0 in | Wt 173.6 lb

## 2018-12-17 DIAGNOSIS — M25511 Pain in right shoulder: Secondary | ICD-10-CM

## 2018-12-17 DIAGNOSIS — R748 Abnormal levels of other serum enzymes: Secondary | ICD-10-CM

## 2018-12-17 DIAGNOSIS — E785 Hyperlipidemia, unspecified: Secondary | ICD-10-CM | POA: Diagnosis not present

## 2018-12-17 DIAGNOSIS — M25512 Pain in left shoulder: Secondary | ICD-10-CM

## 2018-12-17 DIAGNOSIS — M8580 Other specified disorders of bone density and structure, unspecified site: Secondary | ICD-10-CM

## 2018-12-17 MED ORDER — ATORVASTATIN CALCIUM 10 MG PO TABS: 10.0000 mg | ORAL_TABLET | Freq: Every day | ORAL | 1 refills | Status: DC

## 2018-12-17 NOTE — Assessment & Plan Note (Signed)
At goal, will continue.

## 2018-12-17 NOTE — Patient Instructions (Addendum)
Take one 600mg  tablet of calcium as we discussed  For post menopausal women, guidelines recommend a diet with 1200 mg of Calcium per day. If you are eating calcium rich foods, you do not need a calcium supplement. The body better absorbs the calcium that you eat over supplementation. If you do supplement, I recommend not supplementing the full 1200 mg/ day as this can lead to increased risk of cardiovascular disease. I recommend Calcium Citrate over the counter, and you may take a total of 600 to 800 mg per day in divided doses with meals for best absorption.   For bone health, you need adequate vitamin D, and I recommend you supplement as it is harder to do so with diet alone. I recommend cholecalciferol 800 units daily.  Also, please ensure you are following a diet high in calcium -- research shows better outcomes with dietary sources including kale, yogurt, broccolii, cheese, okra, almonds- to name a few.     Also remember that exercise is a great medicine for maintain and preserve bone health. Advise moderate exercise for 30 minutes , 3 times per week.    Always my pleasure to see you!

## 2018-12-17 NOTE — Assessment & Plan Note (Signed)
Currently feeling better, following orthopedics.  Long discussion regards to meloxicam, advised to take this medication with food.  Also advised she consider this medication as short-term, PRN medication.  At some point to we may decrease to lower dose.  Patient verbalized understanding of all.

## 2018-12-17 NOTE — Assessment & Plan Note (Signed)
Discussed recent DEXA scan which showed osteopenia.  Long discussion with regards to appropriate calcium dosing, advised patient she should take less , approximately 600 mg of calcium per day due to risk of coronary calcification.  Discussed appropriate dosing of vitamin D as well.  Emphasized continuing exercise.  We will continue to monitor bone density.

## 2018-12-17 NOTE — Assessment & Plan Note (Addendum)
Has seen GI 10/2018, oncology 09/2018. Reviewed consults with patient today.  Suspect related to osteopenia versus malignancy versus liver etiology.  Will follow.

## 2018-12-17 NOTE — Progress Notes (Signed)
Subjective:    Patient ID: Brittany Farley, female    DOB: [birth year], 68 y.o.   MRN: [id number]  CC: Brittany Farley is a 68 y.o. female who presents today for follow up.   HPI: Feels well today, no new concerns.    Osteopenia- exercises regularly. Has patient back at home.  Taking total 1200 mg calcium every day, 2000 IU vitamin D.  HLD-compliant Lipitor.   Currently following with orthopedics for BL shoulder pain which has improved since steroid injections. Started on mobic.  Has seen Dr Bonna Gains for elevated alk phs 10/2018. Thought to be from osteoporosis Colonoscopy 11/2018. Hepatic steatosis seen on right upper quadrant ultrasound 11/05/2018 Evaluated by oncology, Dr Burlene Arnt in regards elevated alkaline phosphatase.  Low clinical suspicion for malignancy. Mammogram up-to-date.Dexa shows osteopenia.   HISTORY:  Past Medical History:  Diagnosis Date  . Arthritis    Past Surgical History:  Procedure Laterality Date  . ABDOMINAL HYSTERECTOMY  1980  . CHOLECYSTECTOMY  2011  . COLONOSCOPY WITH PROPOFOL N/A 11/29/2018   Procedure: COLONOSCOPY WITH PROPOFOL;  Surgeon: Virgel Manifold, MD;  Location: [facility] ENDOSCOPY;  Service: Endoscopy;  Laterality: N/A;  . OOPHORECTOMY     Family History  Problem Relation Age of Onset  . Breast cancer Mother 52  . Breast cancer Maternal Aunt 70  . Lung cancer Sister   . Diabetes Sister   . Prostate cancer Brother   . Heart disease Brother   . Stroke Cousin   . Hypertension Cousin     Allergies: Patient has no known allergies. Current Outpatient Medications on File Prior to Visit  Medication Sig Dispense Refill  . aspirin EC 81 MG tablet Take 81 mg by mouth daily.    . calcium carbonate (OSCAL) 1500 (600 Ca) MG TABS tablet Take 600 mg of elemental calcium by mouth 2 (two) times daily with a meal.    . Cholecalciferol (VITAMIN D3) 2000 units TABS Take by mouth.    . meloxicam (MOBIC) 15 MG tablet Take 15 mg by mouth daily as needed.       No current facility-administered medications on file prior to visit.     Social History   Tobacco Use  . Smoking status: Never Smoker  . Smokeless tobacco: Never Used  Substance Use Topics  . Alcohol use: Yes    Comment: occasional  . Drug use: Never    Review of Systems  Constitutional: Negative for chills and fever.  Respiratory: Negative for cough.   Cardiovascular: Negative for chest pain and palpitations.  Gastrointestinal: Negative for nausea and vomiting.  Musculoskeletal: Positive for arthralgias.      Objective:    BP 130/82 (BP Location: Left Arm, Patient Position: Sitting, Cuff Size: Normal)   Pulse 63   Ht '5\' 5"'$  (1.651 m)   Wt 173 lb 9.6 oz (78.7 kg)   SpO2 97%   BMI 28.89 kg/m  BP Readings from Last 3 Encounters:  12/17/18 130/82  11/29/18 114/75  10/27/18 137/72   Wt Readings from Last 3 Encounters:  12/17/18 173 lb 9.6 oz (78.7 kg)  11/29/18 173 lb (78.5 kg)  10/27/18 176 lb 9.6 oz (80.1 kg)    Physical Exam Vitals signs reviewed.  Constitutional:      Appearance: She is well-developed.  Eyes:     Conjunctiva/sclera: Conjunctivae normal.  Cardiovascular:     Rate and Rhythm: Normal rate and regular rhythm.     Pulses: Normal pulses.     Heart sounds: Normal  heart sounds.  Pulmonary:     Effort: Pulmonary effort is normal.     Breath sounds: Normal breath sounds. No wheezing, rhonchi or rales.  Skin:    General: Skin is warm and dry.  Neurological:     Mental Status: She is alert.  Psychiatric:        Speech: Speech normal.        Behavior: Behavior normal.        Thought Content: Thought content normal.        Assessment & Plan:   Problem List Items Addressed This Visit      Musculoskeletal and Integument   Osteopenia    Discussed recent DEXA scan which showed osteopenia.  Long discussion with regards to appropriate calcium dosing, advised patient she should take less , approximately 600 mg of calcium per day due to risk of  coronary calcification.  Discussed appropriate dosing of vitamin D as well.  Emphasized continuing exercise.  We will continue to monitor bone density.        Other   Hyperlipidemia - Primary    At goal, will continue.      Relevant Medications   atorvastatin (LIPITOR) 10 MG tablet   Elevated alkaline phosphatase level    Has seen GI 10/2018, oncology 09/2018. Reviewed consults with patient today.  Suspect related to osteopenia versus malignancy versus liver etiology.  Will follow.      Acute pain of both shoulders    Currently feeling better, following orthopedics.  Long discussion regards to meloxicam, advised to take this medication with food.  Also advised she consider this medication as short-term, PRN medication.  At some point to we may decrease to lower dose.  Patient verbalized understanding of all.          I am having Brittany Farley maintain her Vitamin D3, aspirin EC, calcium carbonate, atorvastatin, and meloxicam.   Meds ordered this encounter  Medications  . atorvastatin (LIPITOR) 10 MG tablet    Sig: Take 1 tablet (10 mg total) by mouth daily.    Dispense:  90 tablet    Refill:  1    Refill [id number]    Return precautions given.   Risks, benefits, and alternatives of the medications and treatment plan prescribed today were discussed, and patient expressed understanding.   Education regarding symptom management and diagnosis given to patient on AVS.  Continue to follow with Brittany Hawthorne, FNP for routine health maintenance.   Brittany Farley and I agreed with plan.   Brittany Paris, FNP

## 2019-01-06 ENCOUNTER — Inpatient Hospital Stay: Payer: PPO | Admitting: Genetics

## 2019-02-23 ENCOUNTER — Other Ambulatory Visit: Payer: Self-pay

## 2019-02-23 DIAGNOSIS — E785 Hyperlipidemia, unspecified: Secondary | ICD-10-CM

## 2019-02-23 MED ORDER — ATORVASTATIN CALCIUM 10 MG PO TABS: 10.0000 mg | ORAL_TABLET | Freq: Every day | ORAL | 1 refills | Status: DC

## 2019-06-06 ENCOUNTER — Other Ambulatory Visit: Payer: Self-pay | Admitting: Family

## 2019-06-06 DIAGNOSIS — E785 Hyperlipidemia, unspecified: Secondary | ICD-10-CM

## 2019-06-06 MED ORDER — ATORVASTATIN CALCIUM 10 MG PO TABS: 10.0000 mg | ORAL_TABLET | Freq: Every day | ORAL | 1 refills | Status: DC

## 2019-06-06 NOTE — Telephone Encounter (Signed)
Medication Refill - Medication:  atorvastatin (LIPITOR) 10 MG tablet   Has the patient contacted their pharmacy? Yes advised to call office.   Preferred Pharmacy (with phone number or street name):  [facility]) - [place], [place] [phone] (Phone) [phone] (Fax)   Agent: Please be advised that RX refills may take up to 3 business days. We ask that you follow-up with your pharmacy.

## 2019-06-06 NOTE — Telephone Encounter (Signed)
Refill sent.

## 2019-06-13 ENCOUNTER — Other Ambulatory Visit: Payer: Self-pay

## 2019-07-11 ENCOUNTER — Ambulatory Visit: Payer: PPO

## 2019-07-13 ENCOUNTER — Ambulatory Visit (INDEPENDENT_AMBULATORY_CARE_PROVIDER_SITE_OTHER): Payer: PPO

## 2019-07-13 ENCOUNTER — Other Ambulatory Visit: Payer: Self-pay

## 2019-07-13 DIAGNOSIS — Z23 Encounter for immunization: Secondary | ICD-10-CM | POA: Diagnosis not present

## 2019-09-05 ENCOUNTER — Encounter: Payer: PPO | Admitting: Family

## 2019-11-14 ENCOUNTER — Other Ambulatory Visit: Payer: Self-pay

## 2019-11-14 ENCOUNTER — Telehealth: Payer: Self-pay | Admitting: Family

## 2019-11-14 DIAGNOSIS — E785 Hyperlipidemia, unspecified: Secondary | ICD-10-CM

## 2019-11-14 MED ORDER — ATORVASTATIN CALCIUM 10 MG PO TABS: 10.0000 mg | ORAL_TABLET | Freq: Every day | ORAL | 1 refills | Status: DC

## 2019-11-14 NOTE — Telephone Encounter (Signed)
Melissa from [place] , mail order. Would like to dispense 90 pills of atorvastatin (LIPITOR) 10 MG tablet. Please call her at [phone], ref # [id number], needs ok to dispense .

## 2019-11-14 NOTE — Telephone Encounter (Signed)
I called [facility] & spoke with pharmacy tech Mick Sell. I gave verbal okay to fill atorvastatin 10 MG for #90 with 1 refill.

## 2019-11-15 ENCOUNTER — Other Ambulatory Visit: Payer: Self-pay

## 2019-11-18 ENCOUNTER — Ambulatory Visit: Payer: Medicare Other | Admitting: Family

## 2019-11-24 ENCOUNTER — Other Ambulatory Visit: Payer: Self-pay

## 2019-11-24 DIAGNOSIS — E785 Hyperlipidemia, unspecified: Secondary | ICD-10-CM

## 2019-11-24 MED ORDER — ATORVASTATIN CALCIUM 10 MG PO TABS: 10.0000 mg | ORAL_TABLET | Freq: Every day | ORAL | 1 refills | Status: DC

## 2020-01-18 DIAGNOSIS — H2513 Age-related nuclear cataract, bilateral: Secondary | ICD-10-CM | POA: Diagnosis not present

## 2020-01-19 ENCOUNTER — Telehealth: Payer: Self-pay | Admitting: Family

## 2020-01-19 NOTE — Telephone Encounter (Signed)
Left message for patient to call back and schedule Medicare Annual Wellness Visit (AWV) either virtually or audio only.  No hx of AWV; please schedule at anytime with Denisa O'Brien-Blaney at [facility] [place]   

## 2020-01-19 NOTE — Telephone Encounter (Signed)
Pt called back scheduled appt 01/20/20.

## 2020-01-20 ENCOUNTER — Other Ambulatory Visit: Payer: Self-pay

## 2020-01-20 ENCOUNTER — Ambulatory Visit: Payer: Medicare Other

## 2020-01-20 ENCOUNTER — Telehealth: Payer: Self-pay

## 2020-01-20 NOTE — Telephone Encounter (Signed)
Failed attempt to reach patient for scheduled annual wellness via phone. No answer. Left message to call and complete today or reschedule as appropriate.

## 2020-02-08 DIAGNOSIS — H2511 Age-related nuclear cataract, right eye: Secondary | ICD-10-CM | POA: Diagnosis not present

## 2020-02-08 DIAGNOSIS — E7800 Pure hypercholesterolemia, unspecified: Secondary | ICD-10-CM | POA: Diagnosis not present

## 2020-02-09 ENCOUNTER — Encounter: Payer: Self-pay | Admitting: Ophthalmology

## 2020-02-09 ENCOUNTER — Other Ambulatory Visit: Payer: Self-pay

## 2020-02-13 ENCOUNTER — Other Ambulatory Visit: Payer: Self-pay | Admitting: Family

## 2020-02-13 DIAGNOSIS — E785 Hyperlipidemia, unspecified: Secondary | ICD-10-CM

## 2020-02-15 ENCOUNTER — Encounter: Payer: Self-pay | Admitting: Family

## 2020-02-15 ENCOUNTER — Ambulatory Visit (INDEPENDENT_AMBULATORY_CARE_PROVIDER_SITE_OTHER): Payer: Medicare Other | Admitting: Family

## 2020-02-15 ENCOUNTER — Other Ambulatory Visit: Payer: Self-pay

## 2020-02-15 VITALS — BP 120/68 | HR 67 | Temp 96.6°F | Ht 65.0 in | Wt 175.6 lb

## 2020-02-15 DIAGNOSIS — Z0000 Encounter for general adult medical examination without abnormal findings: Secondary | ICD-10-CM | POA: Diagnosis not present

## 2020-02-15 DIAGNOSIS — M8580 Other specified disorders of bone density and structure, unspecified site: Secondary | ICD-10-CM | POA: Diagnosis not present

## 2020-02-15 LAB — CBC WITH DIFFERENTIAL/PLATELET
Basophils Absolute: 0 10*3/uL (ref 0.0–0.1)
Basophils Relative: 0.2 % (ref 0.0–3.0)
Eosinophils Absolute: 0.3 10*3/uL (ref 0.0–0.7)
Eosinophils Relative: 7.3 % — ABNORMAL HIGH (ref 0.0–5.0)
HCT: 36.6 % (ref 36.0–46.0)
Hemoglobin: 11.9 g/dL — ABNORMAL LOW (ref 12.0–15.0)
Lymphocytes Relative: 43.4 % (ref 12.0–46.0)
Lymphs Abs: 2 10*3/uL (ref 0.7–4.0)
MCHC: 32.3 g/dL (ref 30.0–36.0)
MCV: 72.4 fl — ABNORMAL LOW (ref 78.0–100.0)
Monocytes Absolute: 0.4 10*3/uL (ref 0.1–1.0)
Monocytes Relative: 9.1 % (ref 3.0–12.0)
Neutro Abs: 1.8 10*3/uL (ref 1.4–7.7)
Neutrophils Relative %: 40 % — ABNORMAL LOW (ref 43.0–77.0)
Platelets: 314 10*3/uL (ref 150.0–400.0)
RBC: 5.06 Mil/uL (ref 3.87–5.11)
RDW: 16 % — ABNORMAL HIGH (ref 11.5–15.5)
WBC: 4.6 10*3/uL (ref 4.0–10.5)

## 2020-02-15 LAB — COMPREHENSIVE METABOLIC PANEL
ALT: 20 U/L (ref 0–35)
AST: 30 U/L (ref 0–37)
Albumin: 3.7 g/dL (ref 3.5–5.2)
Alkaline Phosphatase: 162 U/L — ABNORMAL HIGH (ref 39–117)
BUN: 14 mg/dL (ref 6–23)
CO2: 27 mEq/L (ref 19–32)
Calcium: 9 mg/dL (ref 8.4–10.5)
Chloride: 108 mEq/L (ref 96–112)
Creatinine, Ser: 0.72 mg/dL (ref 0.40–1.20)
GFR: 79.95 mL/min (ref >60.00)
Glucose, Bld: 92 mg/dL (ref 70–99)
Potassium: 3.9 mEq/L (ref 3.5–5.1)
Sodium: 139 mEq/L (ref 135–145)
Total Bilirubin: 0.3 mg/dL (ref 0.2–1.2)
Total Protein: 6.7 g/dL (ref 6.0–8.3)

## 2020-02-15 LAB — LIPID PANEL
Cholesterol: 124 mg/dL (ref 0–200)
HDL: 60.2 mg/dL (ref >39.00)
LDL Cholesterol: 56 mg/dL (ref 0–99)
NonHDL: 64.05
Total CHOL/HDL Ratio: 2
Triglycerides: 38 mg/dL (ref 0.0–149.0)
VLDL: 7.6 mg/dL (ref 0.0–40.0)

## 2020-02-15 LAB — HEMOGLOBIN A1C: Hgb A1c MFr Bld: 6.3 % (ref 4.6–6.5)

## 2020-02-15 LAB — VITAMIN D 25 HYDROXY (VIT D DEFICIENCY, FRACTURES): VITD: 74.75 ng/mL (ref 30.00–100.00)

## 2020-02-15 LAB — TSH: TSH: 2.11 u[IU]/mL (ref 0.35–4.50)

## 2020-02-15 NOTE — Progress Notes (Signed)
Subjective:    Patient ID: Brittany Farley, female    DOB: 1951-08-29, 69 y.o.   MRN: [id number]  CC: Brittany Farley is a 69 y.o. female who presents today for physical exam.    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     Colorectal Cancer Screening: UTD Breast Cancer Screening: Mammogram due Cervical Cancer Screening: last pap smear normal 5 years ago in [place] with [facility]. Normal per patient.   Bone Health screening/DEXA for 65+: Due        Tetanus - UTD      Labs: Screening labs today. 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 No CP, sob. Alcohol use: occasional Smoking/tobacco use: Nonsmoker.    HISTORY:  Past Medical History:  Diagnosis Date  . Arthritis    right hand    Past Surgical History:  Procedure Laterality Date  . ABDOMINAL HYSTERECTOMY  1980   had hysterectomy for fibroids. No cancer. Has both ovaries.   . CHOLECYSTECTOMY  2011  . COLONOSCOPY WITH PROPOFOL N/A 11/29/2018   Procedure: COLONOSCOPY WITH PROPOFOL;  Surgeon: Virgel Manifold, MD;  Location: [facility] ENDOSCOPY;  Service: Endoscopy;  Laterality: N/A;  . OOPHORECTOMY     Family History  Problem Relation Age of Onset  . Breast cancer Mother 12  . Breast cancer Maternal Aunt 70  . Lung cancer Sister   . Diabetes Sister   . Prostate cancer Brother   . Heart disease Brother   . Stroke Cousin   . Hypertension Cousin       ALLERGIES: Patient has no known allergies.  Current Outpatient Medications on File Prior to Visit  Medication Sig Dispense Refill  . aspirin EC 81 MG tablet Take 81 mg by mouth daily.    Marland Kitchen atorvastatin (LIPITOR) 10 MG tablet TAKE 1 TABLET BY MOUTH  DAILY 90 tablet 3  . Cholecalciferol (VITAMIN D3) 2000 units TABS Take by mouth.     No current facility-administered medications on file prior to visit.    Social History   Tobacco Use  .  Smoking status: Never Smoker  . Smokeless tobacco: Never Used  Substance Use Topics  . Alcohol use: Yes    Comment: occasional - 1-2 drinks/year  . Drug use: Never    Review of Systems  Constitutional: Negative for chills, fever and unexpected weight change.  HENT: Negative for congestion.   Respiratory: Negative for cough.   Cardiovascular: Negative for chest pain, palpitations and leg swelling.  Gastrointestinal: Negative for nausea and vomiting.  Genitourinary: Negative for pelvic pain and vaginal bleeding.  Musculoskeletal: Negative for arthralgias and myalgias.  Skin: Negative for rash.  Neurological: Negative for headaches.  Hematological: Negative for adenopathy.  Psychiatric/Behavioral: Negative for confusion.      Objective:    BP 120/68   Pulse 67   Temp (!) 96.6 F (35.9 C) (Temporal)   Ht 5\' 5"  (1.651 m)   Wt 175 lb 9.6 oz (79.7 kg)   SpO2 98%   BMI 29.22 kg/m   BP Readings from Last 3 Encounters:  02/15/20 120/68  12/17/18 130/82  11/29/18 114/75   Wt Readings from Last 3 Encounters:  02/15/20 175 lb 9.6 oz (79.7 kg)  12/17/18 173 lb 9.6 oz (78.7 kg)  11/29/18 173 lb (78.5 kg)    Physical Exam Vitals reviewed.  Constitutional:  Appearance: She is well-developed.  Eyes:     Conjunctiva/sclera: Conjunctivae normal.  Neck:     Thyroid: No thyroid mass or thyromegaly.  Cardiovascular:     Rate and Rhythm: Normal rate and regular rhythm.     Pulses: Normal pulses.     Heart sounds: Normal heart sounds.  Pulmonary:     Effort: Pulmonary effort is normal.     Breath sounds: Normal breath sounds. No wheezing, rhonchi or rales.  Chest:     Breasts: Breasts are symmetrical.        Right: No inverted nipple, mass, nipple discharge, skin change or tenderness.        Left: No inverted nipple, mass, nipple discharge, skin change or tenderness.  Lymphadenopathy:     Head:     Right side of head: No submental, submandibular, tonsillar, preauricular,  posterior auricular or occipital adenopathy.     Left side of head: No submental, submandibular, tonsillar, preauricular, posterior auricular or occipital adenopathy.     Cervical: No cervical adenopathy.     Right cervical: No superficial, deep or posterior cervical adenopathy.    Left cervical: No superficial, deep or posterior cervical adenopathy.  Skin:    General: Skin is warm and dry.  Neurological:     Mental Status: She is alert.  Psychiatric:        Speech: Speech normal.        Behavior: Behavior normal.        Thought Content: Thought content normal.        Assessment & Plan:   Problem List Items Addressed This Visit      Musculoskeletal and Integument   Osteopenia    Patient will schedule DEXA      Relevant Orders   DG Bone Density     Other   Routine physical examination - Primary    CBE performed. Deferred pelvic exam in the absence of complaints and the patient had her last Pap smear which was normal per patient 5 to 6 years ago.  Patient will schedule mammogram and bone density.  Referral to nutrition to work with patient on healthy snacks when working.      Relevant Orders   Referral to Nutrition and Diabetes Services   TSH   CBC with Differential/Platelet   Comprehensive metabolic panel   Hemoglobin A1c   Lipid panel   VITAMIN D 25 Hydroxy (Vit-D Deficiency, Fractures)   MM 3D SCREEN BREAST BILATERAL   DG Bone Density       I have discontinued Brittany Farley's calcium carbonate. I am also having her maintain her Vitamin D3, aspirin EC, and atorvastatin.   No orders of the defined types were placed in this encounter.   Return precautions given.   Risks, benefits, and alternatives of the medications and treatment plan prescribed today were discussed, and patient expressed understanding.   Education regarding symptom management and diagnosis given to patient on AVS.   Continue to follow with Burnard Hawthorne, FNP for routine health  maintenance.   [name] and I agreed with plan.   [name], FNP

## 2020-02-15 NOTE — Assessment & Plan Note (Addendum)
CBE performed. Deferred pelvic exam in the absence of complaints and the patient had her last Pap smear which was normal per patient 5 to 6 years ago.  Patient will schedule mammogram and bone density.  Referral to nutrition to work with patient on healthy snacks when working.

## 2020-02-15 NOTE — Patient Instructions (Signed)
You are doing great- always a pleasure to see you!  Please call call and schedule your 3D mammogram, bone density scan as discussed.   [place]  [place], [place]    Health Maintenance for Postmenopausal Women Menopause is a normal process in which your ability to get pregnant comes to an end. This process happens slowly over many months or years, usually between the ages of 23 and 67. Menopause is complete when you have missed your menstrual periods for 12 months. It is important to talk with your health care provider about some of the most common conditions that affect women after menopause (postmenopausal women). These include heart disease, cancer, and bone loss (osteoporosis). Adopting a healthy lifestyle and getting preventive care can help to promote your health and wellness. The actions you take can also lower your chances of developing some of these common conditions. What should I know about menopause? During menopause, you may get a number of symptoms, such as:  Hot flashes. These can be moderate or severe.  Night sweats.  Decrease in sex drive.  Mood swings.  Headaches.  Tiredness.  Irritability.  Memory problems.  Insomnia. Choosing to treat or not to treat these symptoms is a decision that you make with your health care provider. Do I need hormone replacement therapy?  Hormone replacement therapy is effective in treating symptoms that are caused by menopause, such as hot flashes and night sweats.  Hormone replacement carries certain risks, especially as you become older. If you are thinking about using estrogen or estrogen with progestin, discuss the benefits and risks with your health care provider. What is my risk for heart disease and stroke? The risk of heart disease, heart attack, and stroke increases as you age. One of the causes may be a change in the body's hormones during menopause. This can  affect how your body uses dietary fats, triglycerides, and cholesterol. Heart attack and stroke are medical emergencies. There are many things that you can do to help prevent heart disease and stroke. Watch your blood pressure  High blood pressure causes heart disease and increases the risk of stroke. This is more likely to develop in people who have high blood pressure readings, are of African descent, or are overweight.  Have your blood pressure checked: ? Every 3-5 years if you are 69-33 years of age. ? Every year if you are 88 years old or older. Eat a healthy diet   Eat a diet that includes plenty of vegetables, fruits, low-fat dairy products, and lean protein.  Do not eat a lot of foods that are high in solid fats, added sugars, or sodium. Get regular exercise Get regular exercise. This is one of the most important things you can do for your health. Most adults should:  Try to exercise for at least 150 minutes each week. The exercise should increase your heart rate and make you sweat (moderate-intensity exercise).  Try to do strengthening exercises at least twice each week. Do these in addition to the moderate-intensity exercise.  Spend less time sitting. Even light physical activity can be beneficial. Other tips  Work with your health care provider to achieve or maintain a healthy weight.  Do not use any products that contain nicotine or tobacco, such as cigarettes, e-cigarettes, and chewing tobacco. If you need help quitting, ask your health care provider.  Know your numbers. Ask your health care provider to check your cholesterol and your blood  sugar (glucose). Continue to have your blood tested as directed by your health care provider. Do I need screening for cancer? Depending on your health history and family history, you may need to have cancer screening at different stages of your life. This may include screening for:  Breast cancer.  Cervical cancer.  Lung  cancer.  Colorectal cancer. What is my risk for osteoporosis? After menopause, you may be at increased risk for osteoporosis. Osteoporosis is a condition in which bone destruction happens more quickly than new bone creation. To help prevent osteoporosis or the bone fractures that can happen because of osteoporosis, you may take the following actions:  If you are 47-69 years old, get at least 1,000 mg of calcium and at least 600 mg of vitamin D per day.  If you are older than age 36 but younger than age 18, get at least 1,200 mg of calcium and at least 600 mg of vitamin D per day.  If you are older than age 21, get at least 1,200 mg of calcium and at least 800 mg of vitamin D per day. Smoking and drinking excessive alcohol increase the risk of osteoporosis. Eat foods that are rich in calcium and vitamin D, and do weight-bearing exercises several times each week as directed by your health care provider. How does menopause affect my mental health? Depression may occur at any age, but it is more common as you become older. Common symptoms of depression include:  Low or sad mood.  Changes in sleep patterns.  Changes in appetite or eating patterns.  Feeling an overall lack of motivation or enjoyment of activities that you previously enjoyed.  Frequent crying spells. Talk with your health care provider if you think that you are experiencing depression. General instructions See your health care provider for regular wellness exams and vaccines. This may include:  Scheduling regular health, dental, and eye exams.  Getting and maintaining your vaccines. These include: ? Influenza vaccine. Get this vaccine each year before the flu season begins. ? Pneumonia vaccine. ? Shingles vaccine. ? Tetanus, diphtheria, and pertussis (Tdap) booster vaccine. Your health care provider may also recommend other immunizations. Tell your health care provider if you have ever been abused or do not feel safe at  home. Summary  Menopause is a normal process in which your ability to get pregnant comes to an end.  This condition causes hot flashes, night sweats, decreased interest in sex, mood swings, headaches, or lack of sleep.  Treatment for this condition may include hormone replacement therapy.  Take actions to keep yourself healthy, including exercising regularly, eating a healthy diet, watching your weight, and checking your blood pressure and blood sugar levels.  Get screened for cancer and depression. Make sure that you are up to date with all your vaccines. This information is not intended to replace advice given to you by your health care provider. Make sure you discuss any questions you have with your health care provider. Document Revised: 10/20/2018 Document Reviewed: 10/20/2018 Elsevier Patient Education  2020 [employer].

## 2020-02-15 NOTE — Assessment & Plan Note (Signed)
Patient will schedule DEXA

## 2020-02-16 NOTE — Discharge Instructions (Signed)

## 2020-02-17 ENCOUNTER — Other Ambulatory Visit: Payer: Self-pay | Admitting: Family

## 2020-02-17 ENCOUNTER — Other Ambulatory Visit
Admission: RE | Admit: 2020-02-17 | Discharge: 2020-02-17 | Disposition: A | Discharge status: Home / Self Care | Payer: Medicare Other | Source: Ambulatory Visit | Attending: Ophthalmology | Admitting: Ophthalmology

## 2020-02-17 ENCOUNTER — Telehealth: Payer: Self-pay | Admitting: Family

## 2020-02-17 DIAGNOSIS — Z20822 Contact with and (suspected) exposure to covid-19: Secondary | ICD-10-CM | POA: Diagnosis not present

## 2020-02-17 DIAGNOSIS — D649 Anemia, unspecified: Secondary | ICD-10-CM

## 2020-02-17 DIAGNOSIS — Z01812 Encounter for preprocedural laboratory examination: Secondary | ICD-10-CM | POA: Insufficient documentation

## 2020-02-17 LAB — SARS CORONAVIRUS 2 (TAT 6-24 HRS): SARS Coronavirus 2: NEGATIVE

## 2020-02-17 NOTE — Telephone Encounter (Signed)
See results note. 

## 2020-02-17 NOTE — Telephone Encounter (Signed)
Pt returned call about lab results 

## 2020-02-20 ENCOUNTER — Encounter
Admission: RE | Disposition: A | Discharge status: Home / Self Care | Payer: Self-pay | Source: Home / Self Care | Attending: Ophthalmology

## 2020-02-20 ENCOUNTER — Ambulatory Visit
Admission: RE | Admit: 2020-02-20 | Discharge: 2020-02-20 | Disposition: A | Discharge status: Home / Self Care | Payer: Medicare Other | Attending: Ophthalmology | Admitting: Ophthalmology

## 2020-02-20 ENCOUNTER — Encounter: Payer: Self-pay | Admitting: Ophthalmology

## 2020-02-20 ENCOUNTER — Ambulatory Visit: Payer: Medicare Other | Admitting: Anesthesiology

## 2020-02-20 ENCOUNTER — Other Ambulatory Visit: Payer: Self-pay

## 2020-02-20 DIAGNOSIS — M1990 Unspecified osteoarthritis, unspecified site: Secondary | ICD-10-CM | POA: Diagnosis not present

## 2020-02-20 DIAGNOSIS — Z7982 Long term (current) use of aspirin: Secondary | ICD-10-CM | POA: Diagnosis not present

## 2020-02-20 DIAGNOSIS — Z79899 Other long term (current) drug therapy: Secondary | ICD-10-CM | POA: Insufficient documentation

## 2020-02-20 DIAGNOSIS — E7800 Pure hypercholesterolemia, unspecified: Secondary | ICD-10-CM | POA: Insufficient documentation

## 2020-02-20 DIAGNOSIS — H2511 Age-related nuclear cataract, right eye: Secondary | ICD-10-CM | POA: Insufficient documentation

## 2020-02-20 DIAGNOSIS — H25811 Combined forms of age-related cataract, right eye: Secondary | ICD-10-CM | POA: Diagnosis not present

## 2020-02-20 HISTORY — PX: CATARACT EXTRACTION W/PHACO: SHX586

## 2020-02-20 SURGERY — PHACOEMULSIFICATION, CATARACT, WITH IOL INSERTION
Anesthesia: Monitor Anesthesia Care | Site: Eye | Laterality: Right

## 2020-02-20 MED ORDER — SODIUM HYALURONATE 23 MG/ML IO SOLN
INTRAOCULAR | Status: DC | PRN
  Administered 2020-02-20: 0.6 mL via INTRAOCULAR

## 2020-02-20 MED ORDER — LACTATED RINGERS IV SOLN: 10.0000 mL/h | INTRAVENOUS | Status: DC

## 2020-02-20 MED ORDER — TETRACAINE HCL 0.5 % OP SOLN
1.0000 [drp] | OPHTHALMIC | Status: DC | PRN
  Administered 2020-02-20 (×3): 1 [drp] via OPHTHALMIC

## 2020-02-20 MED ORDER — ARMC OPHTHALMIC DILATING DROPS
1.0000 "application " | OPHTHALMIC | Status: DC | PRN
  Administered 2020-02-20 (×3): 1 via OPHTHALMIC

## 2020-02-20 MED ORDER — ACETAMINOPHEN 325 MG PO TABS: 325.0000 mg | ORAL_TABLET | Freq: Once | ORAL | Status: DC

## 2020-02-20 MED ORDER — FENTANYL CITRATE (PF) 100 MCG/2ML IJ SOLN
INTRAMUSCULAR | Status: DC | PRN
  Administered 2020-02-20 (×2): 50 ug via INTRAVENOUS

## 2020-02-20 MED ORDER — SODIUM HYALURONATE 10 MG/ML IO SOLN
INTRAOCULAR | Status: DC | PRN
  Administered 2020-02-20: 0.55 mL via INTRAOCULAR

## 2020-02-20 MED ORDER — MIDAZOLAM HCL 2 MG/2ML IJ SOLN
INTRAMUSCULAR | Status: DC | PRN
  Administered 2020-02-20 (×4): 1 mg via INTRAVENOUS

## 2020-02-20 MED ORDER — LIDOCAINE HCL (PF) 2 % IJ SOLN
INTRAOCULAR | Status: DC | PRN
  Administered 2020-02-20: 1 mL via INTRAOCULAR

## 2020-02-20 MED ORDER — MOXIFLOXACIN HCL 0.5 % OP SOLN
OPHTHALMIC | Status: DC | PRN
  Administered 2020-02-20: 0.2 mL via OPHTHALMIC

## 2020-02-20 MED ORDER — ACETAMINOPHEN 160 MG/5ML PO SOLN: 325.0000 mg | Freq: Once | ORAL | Status: DC

## 2020-02-20 MED ORDER — EPINEPHRINE PF 1 MG/ML IJ SOLN
INTRAOCULAR | Status: DC | PRN
  Administered 2020-02-20: 09:00:00 65 mL via OPHTHALMIC

## 2020-02-20 SURGICAL SUPPLY — 20 items
CANNULA ANT/CHMB 27G (MISCELLANEOUS) ×2 IMPLANT
CANNULA ANT/CHMB 27GA (MISCELLANEOUS) ×6 IMPLANT
DISSECTOR HYDRO NUCLEUS 50X22 (MISCELLANEOUS) ×3 IMPLANT
GLOVE SURG LX 7.5 STRW (GLOVE) ×2
GLOVE SURG LX STRL 7.5 STRW (GLOVE) ×1 IMPLANT
GLOVE SURG SYN 8.5  E (GLOVE) ×2
GLOVE SURG SYN 8.5 E (GLOVE) ×1 IMPLANT
GLOVE SURG SYN 8.5 PF PI (GLOVE) ×1 IMPLANT
GOWN STRL REUS W/ TWL LRG LVL3 (GOWN DISPOSABLE) ×2 IMPLANT
GOWN STRL REUS W/TWL LRG LVL3 (GOWN DISPOSABLE) ×4
LENS IOL DIOP 20.0 (Intraocular Lens) ×3 IMPLANT
LENS IOL TECNIS MONO 20.0 (Intraocular Lens) IMPLANT
MARKER SKIN DUAL TIP RULER LAB (MISCELLANEOUS) ×3 IMPLANT
PACK DR. KING ARMS (PACKS) ×3 IMPLANT
PACK EYE AFTER SURG (MISCELLANEOUS) ×3 IMPLANT
PACK OPTHALMIC (MISCELLANEOUS) ×3 IMPLANT
SYR 3ML LL SCALE MARK (SYRINGE) ×3 IMPLANT
SYR TB 1ML LUER SLIP (SYRINGE) ×3 IMPLANT
WATER STERILE IRR 250ML POUR (IV SOLUTION) ×3 IMPLANT
WIPE NON LINTING 3.25X3.25 (MISCELLANEOUS) ×3 IMPLANT

## 2020-02-20 NOTE — Anesthesia Preprocedure Evaluation (Signed)
Anesthesia Evaluation  Patient identified by MRN, date of birth, ID band Patient awake    Reviewed: Allergy & Precautions, H&P , NPO status , Patient's Chart, lab work & pertinent test results  Airway Mallampati: II  TM Distance: >3 FB Neck ROM: full    Dental no notable dental hx.    Pulmonary    Pulmonary exam normal breath sounds clear to auscultation       Cardiovascular Normal cardiovascular exam Rhythm:regular Rate:Normal     Neuro/Psych    GI/Hepatic   Endo/Other    Renal/GU      Musculoskeletal   Abdominal   Peds  Hematology   Anesthesia Other Findings   Reproductive/Obstetrics                             Anesthesia Physical Anesthesia Plan  ASA: II  Anesthesia Plan: MAC   Post-op Pain Management:    Induction:   PONV Risk Score and Plan: 2 and Treatment may vary due to age or medical condition, TIVA and Midazolam  Airway Management Planned:   Additional Equipment:   Intra-op Plan:   Post-operative Plan:   Informed Consent: I have reviewed the patients History and Physical, chart, labs and discussed the procedure including the risks, benefits and alternatives for the proposed anesthesia with the patient or authorized representative who has indicated his/her understanding and acceptance.     Dental Advisory Given  Plan Discussed with: CRNA  Anesthesia Plan Comments:         Anesthesia Quick Evaluation

## 2020-02-20 NOTE — Anesthesia Postprocedure Evaluation (Signed)
Anesthesia Post Note  Patient: Brittany Farley  Procedure(s) Performed: CATARACT EXTRACTION PHACO AND INTRAOCULAR LENS PLACEMENT ([facility]) RIGHT 2.33  00:24.3 (Right Eye)     Patient location during evaluation: PACU Anesthesia Type: MAC Level of consciousness: awake and alert and oriented Pain management: satisfactory to patient Vital Signs Assessment: post-procedure vital signs reviewed and stable Respiratory status: spontaneous breathing, nonlabored ventilation and respiratory function stable Cardiovascular status: blood pressure returned to baseline and stable Postop Assessment: Adequate PO intake and No signs of nausea or vomiting Anesthetic complications: no    Raliegh Ip

## 2020-02-20 NOTE — H&P (Signed)

## 2020-02-20 NOTE — Op Note (Signed)
OPERATIVE NOTE  Brittany Farley [id number] 02/20/2020   PREOPERATIVE DIAGNOSIS:  Nuclear sclerotic cataract right eye.  H25.11   POSTOPERATIVE DIAGNOSIS:    Nuclear sclerotic cataract right eye.     PROCEDURE:  Phacoemusification with posterior chamber intraocular lens placement of the right eye   LENS:   Implant Name Type Inv. Item Serial No. Manufacturer Lot No. LRB No. Used Action  LENS IOL DIOP 20.0 - [id number] Intraocular Lens LENS IOL DIOP 20.0 [id number]  Right 1 Implanted       Procedure(s): CATARACT EXTRACTION PHACO AND INTRAOCULAR LENS PLACEMENT ([facility]) RIGHT 2.33  00:24.3 (Right)  DCB00 +20.0   ULTRASOUND TIME: 0 minutes 24 seconds.  CDE 2.33   SURGEON:  Benay Pillow, MD, MPH  ANESTHESIOLOGIST: Anesthesiologist: Ronelle Nigh, MD CRNA: Georga Bora, CRNA   ANESTHESIA:  Topical with tetracaine drops augmented with 1% preservative-free intracameral lidocaine.  ESTIMATED BLOOD LOSS: less than 1 mL.   COMPLICATIONS:  None.   DESCRIPTION OF PROCEDURE:  The patient was identified in the holding room and transported to the operating room and placed in the supine position under the operating microscope.  The right eye was identified as the operative eye and it was prepped and draped in the usual sterile ophthalmic fashion.   A 1.0 millimeter clear-corneal paracentesis was made at the 10:30 position. 0.5 ml of preservative-free 1% lidocaine with epinephrine was injected into the anterior chamber.  The anterior chamber was filled with Healon 5 viscoelastic.  A 2.4 millimeter keratome was used to make a near-clear corneal incision at the 8:00 position.  A curvilinear capsulorrhexis was made with a cystotome and capsulorrhexis forceps.  Balanced salt solution was used to hydrodissect and hydrodelineate the nucleus.   Phacoemulsification was then used in stop and chop fashion to remove the lens nucleus and epinucleus.  The remaining cortex was then removed using the  irrigation and aspiration handpiece. Healon was then placed into the capsular bag to distend it for lens placement.  A lens was then injected into the capsular bag.  The remaining viscoelastic was aspirated.   Wounds were hydrated with balanced salt solution.  The anterior chamber was inflated to a physiologic pressure with balanced salt solution.   Intracameral vigamox 0.1 mL undiluted was injected into the eye and a drop placed onto the ocular surface.  No wound leaks were noted.  The patient was taken to the recovery room in stable condition without complications of anesthesia or surgery  Benay Pillow 02/20/2020, 9:08 AM

## 2020-02-20 NOTE — Transfer of Care (Signed)
Immediate Anesthesia Transfer of Care Note  Patient: Brittany Farley  Procedure(s) Performed: CATARACT EXTRACTION PHACO AND INTRAOCULAR LENS PLACEMENT ([facility]) RIGHT 2.33  00:24.3 (Right Eye)  Patient Location: PACU  Anesthesia Type: MAC  Level of Consciousness: awake, alert  and patient cooperative  Airway and Oxygen Therapy: Patient Spontanous Breathing and Patient connected to supplemental oxygen  Post-op Assessment: Post-op Vital signs reviewed, Patient's Cardiovascular Status Stable, Respiratory Function Stable, Patent Airway and No signs of Nausea or vomiting  Post-op Vital Signs: Reviewed and stable  Complications: No apparent anesthesia complications

## 2020-02-20 NOTE — Anesthesia Procedure Notes (Signed)
Procedure Name: MAC Date/Time: 02/20/2020 8:49 AM Performed by: Georga Bora, CRNA Pre-anesthesia Checklist: Patient identified, Emergency Drugs available, Suction available, Patient being monitored and Timeout performed Patient Re-evaluated:Patient Re-evaluated prior to induction Oxygen Delivery Method: Nasal cannula

## 2020-02-21 ENCOUNTER — Encounter: Payer: Self-pay | Admitting: *Deleted

## 2020-02-23 ENCOUNTER — Other Ambulatory Visit (INDEPENDENT_AMBULATORY_CARE_PROVIDER_SITE_OTHER): Payer: Medicare Other

## 2020-02-23 DIAGNOSIS — D649 Anemia, unspecified: Secondary | ICD-10-CM | POA: Diagnosis not present

## 2020-02-23 LAB — FECAL OCCULT BLOOD, IMMUNOCHEMICAL: Fecal Occult Bld: NEGATIVE

## 2020-03-15 ENCOUNTER — Ambulatory Visit
Admission: RE | Admit: 2020-03-15 | Discharge: 2020-03-15 | Disposition: A | Discharge status: Home / Self Care | Payer: Medicare Other | Source: Ambulatory Visit | Attending: Family | Admitting: Family

## 2020-03-15 DIAGNOSIS — M8580 Other specified disorders of bone density and structure, unspecified site: Secondary | ICD-10-CM | POA: Insufficient documentation

## 2020-03-15 DIAGNOSIS — R928 Other abnormal and inconclusive findings on diagnostic imaging of breast: Secondary | ICD-10-CM | POA: Diagnosis not present

## 2020-03-15 DIAGNOSIS — Z0000 Encounter for general adult medical examination without abnormal findings: Secondary | ICD-10-CM

## 2020-03-15 DIAGNOSIS — Z1231 Encounter for screening mammogram for malignant neoplasm of breast: Secondary | ICD-10-CM | POA: Insufficient documentation

## 2020-03-15 DIAGNOSIS — M810 Age-related osteoporosis without current pathological fracture: Secondary | ICD-10-CM | POA: Insufficient documentation

## 2020-03-15 DIAGNOSIS — Z780 Asymptomatic menopausal state: Secondary | ICD-10-CM | POA: Diagnosis not present

## 2020-03-15 DIAGNOSIS — M85851 Other specified disorders of bone density and structure, right thigh: Secondary | ICD-10-CM | POA: Diagnosis not present

## 2020-03-16 ENCOUNTER — Other Ambulatory Visit: Payer: Self-pay | Admitting: Family

## 2020-03-16 ENCOUNTER — Telehealth: Payer: Self-pay | Admitting: Family

## 2020-03-16 ENCOUNTER — Telehealth: Payer: Self-pay

## 2020-03-16 DIAGNOSIS — R928 Other abnormal and inconclusive findings on diagnostic imaging of breast: Secondary | ICD-10-CM

## 2020-03-16 DIAGNOSIS — N6320 Unspecified lump in the left breast, unspecified quadrant: Secondary | ICD-10-CM

## 2020-03-16 NOTE — Telephone Encounter (Signed)
Pt would like a call back about her DEXA scan

## 2020-03-16 NOTE — Telephone Encounter (Signed)
I called patient & she was concerned about DEXA scan. She wanted to know results, but they had not been sent to me yet. She just wanted to know what all she could do & different options to prevent bone loss. She is scheduled for virtual 5/21 @ 8am. She wanted to discuss. I told her when DEXA was resulted to me I would call her or she may receive mychart message.

## 2020-03-16 NOTE — Telephone Encounter (Signed)
LMTCB for mammogram results.  

## 2020-03-19 NOTE — Telephone Encounter (Signed)
Pt returned your call.  

## 2020-03-19 NOTE — Telephone Encounter (Signed)
I have returned patient's call. See result note from 5/7.

## 2020-03-20 ENCOUNTER — Encounter: Payer: Self-pay | Admitting: Family

## 2020-03-21 DIAGNOSIS — H2512 Age-related nuclear cataract, left eye: Secondary | ICD-10-CM | POA: Diagnosis not present

## 2020-03-22 ENCOUNTER — Encounter: Payer: Self-pay | Admitting: Ophthalmology

## 2020-03-26 ENCOUNTER — Ambulatory Visit
Admission: RE | Admit: 2020-03-26 | Discharge: 2020-03-26 | Disposition: A | Discharge status: Home / Self Care | Payer: Medicare Other | Source: Ambulatory Visit | Attending: Family | Admitting: Family

## 2020-03-26 DIAGNOSIS — R928 Other abnormal and inconclusive findings on diagnostic imaging of breast: Secondary | ICD-10-CM | POA: Insufficient documentation

## 2020-03-26 DIAGNOSIS — N6321 Unspecified lump in the left breast, upper outer quadrant: Secondary | ICD-10-CM | POA: Diagnosis not present

## 2020-03-26 DIAGNOSIS — R922 Inconclusive mammogram: Secondary | ICD-10-CM | POA: Diagnosis not present

## 2020-03-29 ENCOUNTER — Other Ambulatory Visit
Admission: RE | Admit: 2020-03-29 | Discharge: 2020-03-29 | Disposition: A | Discharge status: Home / Self Care | Payer: Medicare Other | Source: Ambulatory Visit | Attending: Ophthalmology | Admitting: Ophthalmology

## 2020-03-29 ENCOUNTER — Other Ambulatory Visit: Payer: Self-pay

## 2020-03-29 DIAGNOSIS — Z20822 Contact with and (suspected) exposure to covid-19: Secondary | ICD-10-CM | POA: Diagnosis not present

## 2020-03-29 DIAGNOSIS — Z01812 Encounter for preprocedural laboratory examination: Secondary | ICD-10-CM | POA: Insufficient documentation

## 2020-03-29 NOTE — Discharge Instructions (Signed)

## 2020-03-30 ENCOUNTER — Encounter: Payer: Self-pay | Admitting: Family

## 2020-03-30 ENCOUNTER — Telehealth (INDEPENDENT_AMBULATORY_CARE_PROVIDER_SITE_OTHER): Payer: Medicare Other | Admitting: Family

## 2020-03-30 VITALS — Ht 65.0 in | Wt 175.0 lb

## 2020-03-30 DIAGNOSIS — M8580 Other specified disorders of bone density and structure, unspecified site: Secondary | ICD-10-CM | POA: Diagnosis not present

## 2020-03-30 DIAGNOSIS — R748 Abnormal levels of other serum enzymes: Secondary | ICD-10-CM | POA: Diagnosis not present

## 2020-03-30 LAB — SARS CORONAVIRUS 2 (TAT 6-24 HRS): SARS Coronavirus 2: NEGATIVE

## 2020-03-30 NOTE — Patient Instructions (Signed)
800 mg total per day of calcium; divide among meals for better absorption 800 IU vitamin D per day

## 2020-03-30 NOTE — Progress Notes (Signed)
Virtual Visit via Video Note  I connected with@  on 03/30/20 at  8:00 AM EDT by a video enabled telemedicine application and verified that I am speaking with the correct person using two identifiers.  Location patient: home Location provider:work  Persons participating in the virtual visit: patient, provider  I discussed the limitations of evaluation and management by telemedicine and the availability of in person appointments. The patient expressed understanding and agreed to proceed.   HPI:  Discuss DEXA, osteopenia.  Feels well today. No complaints.   Has never been treated for osteopenia.  Compliant with calcium '1200mg'$  and vitamin d3 25 mg/5000 IU Regular exercise with water aerobics.   Elevated alk phos Normal Ca. No CKD.  NO trouble swallowing. NO planned dental procedures. No falls.   Aware of left breast benign cysts, screening mammogram in one year. Declines consult with breast surgeon for second opinion.   Starting multivitamin with iron.  09/2018- Dr Daphene Jaeger of alkaline phosphatase elevation unclear.  Doubt malignancy.  Recommended isoenzymes.  ROS: See pertinent positives and negatives per HPI.  Past Medical History:  Diagnosis Date  . Arthritis    right hand  . Osteoporosis     Past Surgical History:  Procedure Laterality Date  . ABDOMINAL HYSTERECTOMY  1980   had hysterectomy for fibroids. No cancer. Has both ovaries.   Marland Kitchen CATARACT EXTRACTION W/PHACO Right 02/20/2020   Procedure: CATARACT EXTRACTION PHACO AND INTRAOCULAR LENS PLACEMENT ([facility]) RIGHT 2.33  00:24.3;  Surgeon: Eulogio Bear, MD;  Location: [facility];  Service: Ophthalmology;  Laterality: Right;  . CHOLECYSTECTOMY  2011  . COLONOSCOPY WITH PROPOFOL N/A 11/29/2018   Procedure: COLONOSCOPY WITH PROPOFOL;  Surgeon: Virgel Manifold, MD;  Location: [facility];  Service: Endoscopy;  Laterality: N/A;  . OOPHORECTOMY      Family History  Problem Relation Age of Onset   . Breast cancer Mother 71  . Breast cancer Maternal Aunt 70  . Lung cancer Sister   . Diabetes Sister   . Prostate cancer Brother   . Heart disease Brother   . Stroke Cousin   . Hypertension Cousin       Current Outpatient Medications:  .  aspirin EC 81 MG tablet, Take 81 mg by mouth daily., Disp: , Rfl:  .  atorvastatin (LIPITOR) 10 MG tablet, TAKE 1 TABLET BY MOUTH  DAILY, Disp: 90 tablet, Rfl: 3 .  Calcium Carbonate-Vit D-Min (CALCIUM 1200 PO), Take by mouth in the morning and at bedtime., Disp: , Rfl:  .  Cholecalciferol (VITAMIN D3) 2000 units TABS, Take by mouth., Disp: , Rfl:  .  Multiple Vitamin (MULTIVITAMIN ADULT PO), Take by mouth., Disp: , Rfl:   EXAM:  VITALS per patient if applicable:  GENERAL: alert, oriented, appears well and in no acute distress  HEENT: atraumatic, conjunttiva clear, no obvious abnormalities on inspection of external nose and ears  NECK: normal movements of the head and neck  LUNGS: on inspection no signs of respiratory distress, breathing rate appears normal, no obvious gross SOB, gasping or wheezing  CV: no obvious cyanosis  MS: moves all visible extremities without noticeable abnormality  PSYCH/NEURO: pleasant and cooperative, no obvious depression or anxiety, speech and thought processing grossly intact  ASSESSMENT AND PLAN:  Discussed the following assessment and plan:  Elevated alkaline phosphatase level - Plan: Ambulatory referral to Endocrinology  Osteopenia, unspecified location - Plan: Ambulatory referral to Endocrinology Problem List Items Addressed This Visit      Musculoskeletal and  Integument   Osteopenia    Long productive discussion with patient regards to treatment for osteopenia including optimization of vitamin D and calcium.  We also discussed her history of elevated alkaline phosphatase which appears to be a bone etiology.  She had a benign work-up from oncology, Dr. Ignatius Specking in 2019.  We discussed consideration  of Fosamax which  I think patient would be candidate for.  In setting of her chronically elevated alkaline phosphatase, advised to be more comfortable wif endocrine were to see her and agree also  that biphosphonate was appropriate with her history.  Referral has been placed.      Relevant Orders   Ambulatory referral to Endocrinology     Other   Elevated alkaline phosphatase level - Primary   Relevant Orders   Ambulatory referral to Endocrinology      -we discussed possible serious and likely etiologies, options for evaluation and workup, limitations of telemedicine visit vs in person visit, treatment, treatment risks and precautions. Pt prefers to treat via telemedicine empirically rather then risking or undertaking an in person visit at this moment. Patient agrees to seek prompt in person care if worsening, new symptoms arise, or if is not improving with treatment.   I discussed the assessment and treatment plan with the patient. The patient was provided an opportunity to ask questions and all were answered. The patient agreed with the plan and demonstrated an understanding of the instructions.   The patient was advised to call back or seek an in-person evaluation if the symptoms worsen or if the condition fails to improve as anticipated.   [name], FNP

## 2020-03-30 NOTE — Assessment & Plan Note (Signed)
Long productive discussion with patient regards to treatment for osteopenia including optimization of vitamin D and calcium.  We also discussed her history of elevated alkaline phosphatase which appears to be a bone etiology.  She had a benign work-up from oncology, Dr. Ignatius Specking in 2019.  We discussed consideration of Fosamax which  I think patient would be candidate for.  In setting of her chronically elevated alkaline phosphatase, advised to be more comfortable wif endocrine were to see her and agree also  that biphosphonate was appropriate with her history.  Referral has been placed.

## 2020-04-02 ENCOUNTER — Ambulatory Visit: Payer: Medicare Other | Admitting: Anesthesiology

## 2020-04-02 ENCOUNTER — Encounter
Admission: RE | Disposition: A | Discharge status: Home / Self Care | Payer: Self-pay | Source: Home / Self Care | Attending: Ophthalmology

## 2020-04-02 ENCOUNTER — Ambulatory Visit
Admission: RE | Admit: 2020-04-02 | Discharge: 2020-04-02 | Disposition: A | Discharge status: Home / Self Care | Payer: Medicare Other | Attending: Ophthalmology | Admitting: Ophthalmology

## 2020-04-02 ENCOUNTER — Other Ambulatory Visit: Payer: Self-pay

## 2020-04-02 ENCOUNTER — Encounter: Payer: Self-pay | Admitting: Ophthalmology

## 2020-04-02 DIAGNOSIS — H2512 Age-related nuclear cataract, left eye: Secondary | ICD-10-CM | POA: Insufficient documentation

## 2020-04-02 DIAGNOSIS — Z79899 Other long term (current) drug therapy: Secondary | ICD-10-CM | POA: Insufficient documentation

## 2020-04-02 DIAGNOSIS — H25812 Combined forms of age-related cataract, left eye: Secondary | ICD-10-CM | POA: Diagnosis not present

## 2020-04-02 DIAGNOSIS — Z7982 Long term (current) use of aspirin: Secondary | ICD-10-CM | POA: Insufficient documentation

## 2020-04-02 DIAGNOSIS — M1990 Unspecified osteoarthritis, unspecified site: Secondary | ICD-10-CM | POA: Diagnosis not present

## 2020-04-02 DIAGNOSIS — E7800 Pure hypercholesterolemia, unspecified: Secondary | ICD-10-CM | POA: Diagnosis not present

## 2020-04-02 HISTORY — DX: Age-related osteoporosis without current pathological fracture: M81.0

## 2020-04-02 HISTORY — PX: CATARACT EXTRACTION W/PHACO: SHX586

## 2020-04-02 SURGERY — PHACOEMULSIFICATION, CATARACT, WITH IOL INSERTION
Anesthesia: Monitor Anesthesia Care | Site: Eye | Laterality: Left

## 2020-04-02 MED ORDER — MOXIFLOXACIN HCL 0.5 % OP SOLN
OPHTHALMIC | Status: DC | PRN
  Administered 2020-04-02: 0.2 mL via OPHTHALMIC

## 2020-04-02 MED ORDER — SODIUM HYALURONATE 10 MG/ML IO SOLN
INTRAOCULAR | Status: DC | PRN
  Administered 2020-04-02: 0.55 mL via INTRAOCULAR

## 2020-04-02 MED ORDER — ARMC OPHTHALMIC DILATING DROPS
1.0000 "application " | OPHTHALMIC | Status: DC | PRN
  Administered 2020-04-02 (×3): 1 via OPHTHALMIC

## 2020-04-02 MED ORDER — ONDANSETRON HCL 4 MG/2ML IJ SOLN: 4.0000 mg | Freq: Once | INTRAMUSCULAR | Status: DC | PRN

## 2020-04-02 MED ORDER — SODIUM HYALURONATE 23 MG/ML IO SOLN
INTRAOCULAR | Status: DC | PRN
  Administered 2020-04-02: 0.6 mL via INTRAOCULAR

## 2020-04-02 MED ORDER — ACETAMINOPHEN 325 MG PO TABS: 325.0000 mg | ORAL_TABLET | ORAL | Status: DC | PRN

## 2020-04-02 MED ORDER — FENTANYL CITRATE (PF) 100 MCG/2ML IJ SOLN
INTRAMUSCULAR | Status: DC | PRN
  Administered 2020-04-02 (×2): 50 ug via INTRAVENOUS

## 2020-04-02 MED ORDER — TETRACAINE HCL 0.5 % OP SOLN
1.0000 [drp] | OPHTHALMIC | Status: DC | PRN
  Administered 2020-04-02 (×3): 1 [drp] via OPHTHALMIC

## 2020-04-02 MED ORDER — MIDAZOLAM HCL 2 MG/2ML IJ SOLN
INTRAMUSCULAR | Status: DC | PRN
  Administered 2020-04-02: 2 mg via INTRAVENOUS

## 2020-04-02 MED ORDER — EPINEPHRINE PF 1 MG/ML IJ SOLN
INTRAOCULAR | Status: DC | PRN
  Administered 2020-04-02: 95 mL via OPHTHALMIC

## 2020-04-02 MED ORDER — ACETAMINOPHEN 160 MG/5ML PO SOLN: 325.0000 mg | ORAL | Status: DC | PRN

## 2020-04-02 MED ORDER — LIDOCAINE HCL (PF) 2 % IJ SOLN
INTRAOCULAR | Status: DC | PRN
  Administered 2020-04-02: 1 mL via INTRAOCULAR

## 2020-04-02 SURGICAL SUPPLY — 20 items
CANNULA ANT/CHMB 27G (MISCELLANEOUS) ×2 IMPLANT
CANNULA ANT/CHMB 27GA (MISCELLANEOUS) ×6 IMPLANT
DISSECTOR HYDRO NUCLEUS 50X22 (MISCELLANEOUS) ×3 IMPLANT
GLOVE SURG LX 7.5 STRW (GLOVE) ×2
GLOVE SURG LX STRL 7.5 STRW (GLOVE) ×1 IMPLANT
GLOVE SURG SYN 8.5  E (GLOVE) ×4
GLOVE SURG SYN 8.5 E (GLOVE) ×2 IMPLANT
GLOVE SURG SYN 8.5 PF PI (GLOVE) ×1 IMPLANT
GOWN STRL REUS W/ TWL LRG LVL3 (GOWN DISPOSABLE) ×2 IMPLANT
GOWN STRL REUS W/TWL LRG LVL3 (GOWN DISPOSABLE) ×4
LENS IOL TECNIS 19.0 (Intraocular Lens) ×3 IMPLANT
LENS IOL TECNIS MONO 1P 19.0 (Intraocular Lens) IMPLANT
MARKER SKIN DUAL TIP RULER LAB (MISCELLANEOUS) ×3 IMPLANT
PACK DR. KING ARMS (PACKS) ×3 IMPLANT
PACK EYE AFTER SURG (MISCELLANEOUS) ×3 IMPLANT
PACK OPTHALMIC (MISCELLANEOUS) ×3 IMPLANT
SYR 3ML LL SCALE MARK (SYRINGE) ×3 IMPLANT
SYR TB 1ML LUER SLIP (SYRINGE) ×3 IMPLANT
WATER STERILE IRR 250ML POUR (IV SOLUTION) ×3 IMPLANT
WIPE NON LINTING 3.25X3.25 (MISCELLANEOUS) ×3 IMPLANT

## 2020-04-02 NOTE — Anesthesia Procedure Notes (Signed)
Procedure Name: MAC Performed by: Amyot, Michael, CRNA Pre-anesthesia Checklist: Patient identified, Emergency Drugs available, Suction available, Timeout performed and Patient being monitored Patient Re-evaluated:Patient Re-evaluated prior to induction Oxygen Delivery Method: Nasal cannula Placement Confirmation: positive ETCO2       

## 2020-04-02 NOTE — Anesthesia Postprocedure Evaluation (Signed)
Anesthesia Post Note  Patient: Brittany Farley  Procedure(s) Performed: CATARACT EXTRACTION PHACO AND INTRAOCULAR LENS PLACEMENT ([place]) LEFT (Left Eye)     Patient location during evaluation: PACU Anesthesia Type: MAC Level of consciousness: awake and alert Pain management: pain level controlled Vital Signs Assessment: post-procedure vital signs reviewed and stable Respiratory status: spontaneous breathing, nonlabored ventilation, respiratory function stable and patient connected to nasal cannula oxygen Cardiovascular status: stable and blood pressure returned to baseline Postop Assessment: no apparent nausea or vomiting Anesthetic complications: no    Alisa Graff

## 2020-04-02 NOTE — H&P (Signed)

## 2020-04-02 NOTE — Op Note (Signed)
OPERATIVE NOTE  Brittany Farley [id number] 04/02/2020   PREOPERATIVE DIAGNOSIS:  Nuclear sclerotic cataract left eye.  H25.12   POSTOPERATIVE DIAGNOSIS:    Nuclear sclerotic cataract left eye.     PROCEDURE:  Phacoemusification with posterior chamber intraocular lens placement of the left eye   LENS:   Implant Name Type Inv. Item Serial No. Manufacturer Lot No. LRB No. Used Action  LENS IOL TECNIS 19.0 - [id number] Intraocular Lens LENS IOL TECNIS 19.0 [id number] AMO  Left 1 Implanted      Procedure(s) with comments: CATARACT EXTRACTION PHACO AND INTRAOCULAR LENS PLACEMENT ([facility]) LEFT (Left) - 1.68 0:28.8  DCB00 +19.0   ULTRASOUND TIME: 0 minutes 28 seconds.  CDE 1.68   SURGEON:  Benay Pillow, MD, MPH   ANESTHESIA:  Topical with tetracaine drops augmented with 1% preservative-free intracameral lidocaine.  ESTIMATED BLOOD LOSS: <1 mL   COMPLICATIONS:  None.   DESCRIPTION OF PROCEDURE:  The patient was identified in the holding room and transported to the operating room and placed in the supine position under the operating microscope.  The left eye was identified as the operative eye and it was prepped and draped in the usual sterile ophthalmic fashion.   A 1.0 millimeter clear-corneal paracentesis was made at the 5:00 position. 0.5 ml of preservative-free 1% lidocaine with epinephrine was injected into the anterior chamber.  The anterior chamber was filled with Healon 5 viscoelastic.  A 2.4 millimeter keratome was used to make a near-clear corneal incision at the 2:00 position.  A curvilinear capsulorrhexis was made with a cystotome and capsulorrhexis forceps.  Balanced salt solution was used to hydrodissect and hydrodelineate the nucleus.   Phacoemulsification was then used in stop and chop fashion to remove the lens nucleus and epinucleus.  The remaining cortex was then removed using the irrigation and aspiration handpiece. Healon was then placed into the capsular bag to distend it  for lens placement.  A lens was then injected into the capsular bag.  The remaining viscoelastic was aspirated.   Wounds were hydrated with balanced salt solution.  The anterior chamber was inflated to a physiologic pressure with balanced salt solution.  Intracameral vigamox 0.1 mL undiltued was injected into the eye and a drop placed onto the ocular surface.  No wound leaks were noted.  The patient was taken to the recovery room in stable condition without complications of anesthesia or surgery  Benay Pillow 04/02/2020, 12:14 PM

## 2020-04-02 NOTE — Anesthesia Preprocedure Evaluation (Signed)
Anesthesia Evaluation  Patient identified by MRN, date of birth, ID band Patient awake    Reviewed: Allergy & Precautions, H&P , NPO status , Patient's Chart, lab work & pertinent test results, reviewed documented beta blocker date and time   Airway Mallampati: II  TM Distance: >3 FB Neck ROM: full    Dental no notable dental hx.    Pulmonary neg pulmonary ROS,    Pulmonary exam normal breath sounds clear to auscultation       Cardiovascular Exercise Tolerance: Good negative cardio ROS   Rhythm:regular Rate:Normal     Neuro/Psych negative neurological ROS  negative psych ROS   GI/Hepatic negative GI ROS, Neg liver ROS,   Endo/Other  negative endocrine ROS  Renal/GU negative Renal ROS  negative genitourinary   Musculoskeletal  (+) Arthritis ,   Abdominal   Peds  Hematology negative hematology ROS (+)   Anesthesia Other Findings   Reproductive/Obstetrics negative OB ROS                             Anesthesia Physical Anesthesia Plan  ASA: II  Anesthesia Plan: MAC   Post-op Pain Management:    Induction:   PONV Risk Score and Plan: 2 and Treatment may vary due to age or medical condition  Airway Management Planned:   Additional Equipment:   Intra-op Plan:   Post-operative Plan:   Informed Consent: I have reviewed the patients History and Physical, chart, labs and discussed the procedure including the risks, benefits and alternatives for the proposed anesthesia with the patient or authorized representative who has indicated his/her understanding and acceptance.     Dental Advisory Given  Plan Discussed with: CRNA  Anesthesia Plan Comments:         Anesthesia Quick Evaluation

## 2020-04-02 NOTE — Transfer of Care (Signed)
Immediate Anesthesia Transfer of Care Note  Patient: Brittany Farley  Procedure(s) Performed: CATARACT EXTRACTION PHACO AND INTRAOCULAR LENS PLACEMENT ([facility]) LEFT (Left Eye)  Patient Location: PACU  Anesthesia Type: MAC  Level of Consciousness: awake, alert  and patient cooperative  Airway and Oxygen Therapy: Patient Spontanous Breathing and Patient connected to supplemental oxygen  Post-op Assessment: Post-op Vital signs reviewed, Patient's Cardiovascular Status Stable, Respiratory Function Stable, Patent Airway and No signs of Nausea or vomiting  Post-op Vital Signs: Reviewed and stable  Complications: No apparent anesthesia complications

## 2020-04-03 ENCOUNTER — Encounter: Payer: Self-pay | Admitting: *Deleted

## 2020-04-06 ENCOUNTER — Telehealth: Payer: Self-pay

## 2020-04-06 DIAGNOSIS — Z1211 Encounter for screening for malignant neoplasm of colon: Secondary | ICD-10-CM

## 2020-04-06 NOTE — Telephone Encounter (Signed)
Patient is due for cologuard again. Patient stated we can go ahead a order it for her.

## 2020-04-06 NOTE — Telephone Encounter (Signed)
Cologuard has been ordered, in Epic an Technical brewer.

## 2020-04-06 NOTE — Addendum Note (Signed)
Addended by: Nanci Pina on: 04/06/2020 02:16 PM   Modules accepted: Orders

## 2020-04-13 ENCOUNTER — Other Ambulatory Visit (INDEPENDENT_AMBULATORY_CARE_PROVIDER_SITE_OTHER): Payer: Medicare Other

## 2020-04-13 ENCOUNTER — Encounter: Payer: Self-pay | Admitting: Family

## 2020-04-13 ENCOUNTER — Other Ambulatory Visit: Payer: Self-pay

## 2020-04-13 DIAGNOSIS — D649 Anemia, unspecified: Secondary | ICD-10-CM

## 2020-04-13 LAB — CBC WITH DIFFERENTIAL/PLATELET
Basophils Absolute: 0 10*3/uL (ref 0.0–0.1)
Basophils Relative: 1 % (ref 0.0–3.0)
Eosinophils Absolute: 0.4 10*3/uL (ref 0.0–0.7)
Eosinophils Relative: 7.3 % — ABNORMAL HIGH (ref 0.0–5.0)
HCT: 37.6 % (ref 36.0–46.0)
Hemoglobin: 12.2 g/dL (ref 12.0–15.0)
Lymphocytes Relative: 34.7 % (ref 12.0–46.0)
Lymphs Abs: 1.7 10*3/uL (ref 0.7–4.0)
MCHC: 32.5 g/dL (ref 30.0–36.0)
MCV: 73.7 fl — ABNORMAL LOW (ref 78.0–100.0)
Monocytes Absolute: 0.4 10*3/uL (ref 0.1–1.0)
Monocytes Relative: 9 % (ref 3.0–12.0)
Neutro Abs: 2.3 10*3/uL (ref 1.4–7.7)
Neutrophils Relative %: 48 % (ref 43.0–77.0)
Platelets: 313 10*3/uL (ref 150.0–400.0)
RBC: 5.1 Mil/uL (ref 3.87–5.11)
RDW: 16.5 % — ABNORMAL HIGH (ref 11.5–15.5)
WBC: 4.8 10*3/uL (ref 4.0–10.5)

## 2020-04-13 LAB — IBC + FERRITIN
Ferritin: 12.4 ng/mL (ref 10.0–291.0)
Iron: 55 ug/dL (ref 42–145)
Saturation Ratios: 10.6 % — ABNORMAL LOW (ref 20.0–50.0)
Transferrin: 370 mg/dL — ABNORMAL HIGH (ref 212.0–360.0)

## 2020-04-14 ENCOUNTER — Encounter: Payer: Self-pay | Admitting: Family

## 2020-04-16 ENCOUNTER — Other Ambulatory Visit: Payer: Self-pay | Admitting: Family

## 2020-04-16 ENCOUNTER — Encounter: Payer: Self-pay | Admitting: Family

## 2020-04-30 ENCOUNTER — Telehealth: Payer: Self-pay | Admitting: Family

## 2020-04-30 NOTE — Telephone Encounter (Signed)
Call pt  GOOD news.   Consulted with oncology, Dr Arthor Captain, whom she saw in the past as it relates to elevated eosinophils I recent labs  He doesn't feel that she needs further oncology work up.   However if she is having a lot of allergy symptoms, let me know and she can make an appt with me.

## 2020-04-30 NOTE — Telephone Encounter (Signed)
-----   Message from Cammie Sickle, MD sent at 04/30/2020  6:32 AM EDT ----- Regarding: RE: Good Morning Arnette! I'm good, hope you are.  I reviewed the counts- they are overall stable; I would not any follow up with at this time.   If she has any symptomatic allergic manifestation , might further allergic work up/ treatment. But, no hematology work up.   Thanks, GB ----- Message ----- From: Burnard Hawthorne, FNP Sent: 04/30/2020   6:01 AM EDT To: Cammie Sickle, MD  Hi Dr B,   [facility] you are well ! Patient's relative eosinophils remain high, however absolute are normal.   Is that deserving of a referral back to you? You last saw her in 2019    Thanks as always! Joycelyn Schmid

## 2020-05-03 NOTE — Telephone Encounter (Signed)
Left message to return call 

## 2020-05-03 NOTE — Telephone Encounter (Signed)
Patient calling back in and informed of the below. Patient verbalized understanding Patient states she is not needing an appointment at this time.   Patient is inquiring about iron. States that with her last labs an iron supplement was discussed. Patient would like this sent in as a prescription.  Please advise

## 2020-05-04 NOTE — Telephone Encounter (Signed)
Called and informed the patient She verbalized understanding.

## 2020-05-04 NOTE — Telephone Encounter (Signed)
Call pt I dont recommend iron prescription. I advised her to take a women's multivitamin. This will include iron  Centrum and One A Day are nice brands

## 2020-05-16 DIAGNOSIS — Z961 Presence of intraocular lens: Secondary | ICD-10-CM | POA: Diagnosis not present

## 2020-06-11 ENCOUNTER — Encounter: Payer: Self-pay | Admitting: Internal Medicine

## 2020-06-11 ENCOUNTER — Other Ambulatory Visit: Payer: Self-pay

## 2020-06-11 ENCOUNTER — Ambulatory Visit: Payer: Medicare Other | Admitting: Internal Medicine

## 2020-06-11 VITALS — BP 132/78 | HR 60 | Ht 65.0 in | Wt 172.4 lb

## 2020-06-11 DIAGNOSIS — M810 Age-related osteoporosis without current pathological fracture: Secondary | ICD-10-CM | POA: Insufficient documentation

## 2020-06-11 DIAGNOSIS — K760 Fatty (change of) liver, not elsewhere classified: Secondary | ICD-10-CM

## 2020-06-11 DIAGNOSIS — R748 Abnormal levels of other serum enzymes: Secondary | ICD-10-CM

## 2020-06-11 MED ORDER — ALENDRONATE SODIUM 70 MG PO TABS: 70.0000 mg | ORAL_TABLET | ORAL | 3 refills | Status: DC

## 2020-06-11 NOTE — Progress Notes (Signed)
Name: Brittany Farley  MRN/ DOB: [id number], 09/27/1951    Age/ Sex: 69 y.o., female    PCP: Burnard Hawthorne, FNP   Reason for Endocrinology Evaluation: Elevated Alkaline phhosphatase     Date of Initial Endocrinology Evaluation: 06/11/2020     HPI: Brittany Farley is a 69 y.o. female with a past medical history of Osteopenia and Dyslipidemia. The patient presented for initial endocrinology clinic visit on 06/11/2020 for consultative assistance with her elevated Alkaline phosphatase.   Pt has been noted to have elevated Alkaline phosphatase since 2018 with fluctuating readings.    Pt was diagnosed with osteoporosis:2019  Menarche at age : 26 Menopausal at age : Hysterectomy less than 30 yrs Fracture Hx: no Hx of HRT: no FH of osteoporosis or hip fracture: Mother hip fracture  Prior Hx of anti-estrogenic therapy : no  Prior Hx of anti-resorptive therapy : no    Calcium 600 mg BID  Vitamin D 2000 iu daily   She has started soul cycling classes   Denies GERD     HISTORY:  Past Medical History:  Past Medical History:  Diagnosis Date   Arthritis    right hand   Osteoporosis    Past Surgical History:  Past Surgical History:  Procedure Laterality Date   ABDOMINAL HYSTERECTOMY  1980   had hysterectomy for fibroids. No cancer. Has both ovaries.    CATARACT EXTRACTION W/PHACO Right 02/20/2020   Procedure: CATARACT EXTRACTION PHACO AND INTRAOCULAR LENS PLACEMENT (IOC) RIGHT 2.33  00:24.3;  Surgeon: Eulogio Bear, MD;  Location: [place];  Service: Ophthalmology;  Laterality: Right;   CATARACT EXTRACTION W/PHACO Left 04/02/2020   Procedure: CATARACT EXTRACTION PHACO AND INTRAOCULAR LENS PLACEMENT ([place]) LEFT;  Surgeon: Eulogio Bear, MD;  Location: [place];  Service: Ophthalmology;  Laterality: Left;  1.68 0:28.8   CHOLECYSTECTOMY  2011   COLONOSCOPY WITH PROPOFOL N/A 11/29/2018   Procedure: COLONOSCOPY WITH PROPOFOL;  Surgeon: Virgel Manifold, MD;  Location: [facility];  Service: Endoscopy;  Laterality: N/A;   OOPHORECTOMY        Social History:  reports that she has never smoked. She has never used smokeless tobacco. She reports current alcohol use. She reports that she does not use drugs.  Family History: family history includes Breast cancer (age of onset: 66) in her maternal aunt and mother; Diabetes in her sister; Heart disease in her brother; Hypertension in her cousin; Lung cancer in her sister; Prostate cancer in her brother; Stroke in her cousin.   HOME MEDICATIONS: Allergies as of 06/11/2020   No Known Allergies     Medication List       Accurate as of June 11, 2020  1:52 PM. If you have any questions, ask your nurse or doctor.        aspirin EC 81 MG tablet Take 81 mg by mouth daily.   atorvastatin 10 MG tablet Commonly known as: LIPITOR TAKE 1 TABLET BY MOUTH  DAILY   CALCIUM 1200 PO Take by mouth in the morning and at bedtime.   MULTIVITAMIN ADULT PO Take by mouth.   Vitamin D3 50 MCG (2000 UT) Tabs Take by mouth.         REVIEW OF SYSTEMS: A comprehensive ROS was conducted with the patient and is negative except as per HPI and below:  ROS     OBJECTIVE:  VS: BP 132/78 (BP Location: Left Arm, Patient Position: Sitting, Cuff Size: Large)  Pulse 60    Ht '5\' 5"'$  (1.651 m)    Wt 172 lb 6.4 oz (78.2 kg)    SpO2 98%    BMI 28.69 kg/m    Wt Readings from Last 3 Encounters:  06/11/20 172 lb 6.4 oz (78.2 kg)  04/02/20 173 lb (78.5 kg)  03/30/20 175 lb (79.4 kg)     EXAM: General: Pt appears well and is in NAD  Hydration: Well-hydrated with moist mucous membranes and good skin turgor  Eyes: External eye exam normal without stare, lid lag or exophthalmos.  EOM intact.  PERRL.  Ears, Nose, Throat: Hearing: Grossly intact bilaterally Dental: Good dentition  Throat: Clear without mass, erythema or exudate  Neck: General: Supple without adenopathy. Thyroid: Thyroid size  normal.  No goiter or nodules appreciated. No thyroid bruit.  Lungs: Clear with good BS bilat with no rales, rhonchi, or wheezes  Heart: Auscultation: RRR.  Abdomen: Normoactive bowel sounds, soft, nontender, without masses or organomegaly palpable  Extremities: Gait and station: Normal gait  Digits and nails: No clubbing, cyanosis, petechiae, or nodes Head and neck: Normal alignment and mobility BL UE: Normal ROM and strength. BL LE: No pretibial edema normal ROM and strength.  Skin: Hair: Texture and amount normal with gender appropriate distribution Skin Inspection: No rashes, acanthosis nigricans/skin tags. No lipohypertrophy Skin Palpation: Skin temperature, texture, and thickness normal to palpation  Neuro: Cranial nerves: II - XII grossly intact  Cerebellar: Normal coordination and movement; no tremor Motor: Normal strength throughout DTRs: 2+ and symmetric in UE without delay in relaxation phase  Mental Status: Judgment, insight: Intact Orientation: Oriented to time, place, and person Memory: Intact for recent and remote events Mood and affect: No depression, anxiety, or agitation     DATA REVIEWED:   Results for JANA, SWARTZLANDER (MRN [id number]) as of 06/11/2020 08:29  Ref. Range 09/17/2018 12:33  Alk Phos Bone Fract Latest Ref Range: 14 - 68 % 58  Alk Phos Liver Fract Latest Ref Range: 18 - 85 % 42  Intestinal % Latest Ref Range: 0 - 18 % 0      Results for YARELI, CARTHEN (MRN [id number]) as of 06/11/2020 08:29  Ref. Range 10/27/2018 15:37 02/15/2020 10:41  Sodium Latest Ref Range: 135 - 145 mEq/L  139  Potassium Latest Ref Range: 3.5 - 5.1 mEq/L  3.9  Chloride Latest Ref Range: 96 - 112 mEq/L  108  CO2 Latest Ref Range: 19 - 32 mEq/L  27  Glucose Latest Ref Range: 70 - 99 mg/dL  92  BUN Latest Ref Range: 6 - 23 mg/dL  14  Creatinine Latest Ref Range: 0.40 - 1.20 mg/dL  0.72  Calcium Latest Ref Range: 8.4 - 10.5 mg/dL  9.0  Alkaline Phosphatase Latest Ref Range: 39 - 117 U/L   162 (H)  Albumin Latest Ref Range: 3.5 - 5.2 g/dL  3.7  AST Latest Ref Range: 0 - 37 U/L  30  ALT Latest Ref Range: 0 - 35 U/L  20  Total Protein Latest Ref Range: 6.0 - 8.3 g/dL  6.7  Total Bilirubin Latest Ref Range: 0.2 - 1.2 mg/dL  0.3  GFR Latest Ref Range: >60.00 mL/min  79.95  Total CHOL/HDL Ratio Unknown  2  Cholesterol Latest Ref Range: 0 - 200 mg/dL  124  HDL Cholesterol Latest Ref Range: >39.00 mg/dL  60.20  LDL (calc) Latest Ref Range: 0 - 99 mg/dL  56  NonHDL Unknown  64.05  Triglycerides Latest Ref Range: 0 -  149 mg/dL  38.0  VLDL Latest Ref Range: 0.0 - 40.0 mg/dL  7.6  Ferritin Latest Ref Range: 15.0 - 150.0 ng/mL 20   Ceruloplasmin Latest Ref Range: 19.0 - 39.0 mg/dL 32.0   VITD Latest Ref Range: 30.00 - 100.00 ng/mL  74.75   Results for JAYE, SAAL (MRN [id number]) as of 06/11/2020 08:29  Ref. Range 02/15/2020 10:41  Glucose Latest Ref Range: 70 - 99 mg/dL 92  Hemoglobin A1C Latest Ref Range: 4.6 - 6.5 % 6.3  TSH Latest Ref Range: 0.35 - 4.50 uIU/mL 2.11      DXA 03/15/2020  DENSITOMETRY RESULTS: Site         Region     Measured Date Measured Age WHO Classification Young Adult T-score BMD         %Change vs. Previous Significant Change (*) DualFemur Neck Right 03/15/2020 70.6 Osteopenia -1.3 0.854 g/cm2 -3.5% - DualFemur Neck Right 10/13/2018 69.2 Osteopenia -1.1 0.885 g/cm2 -4.5% - DualFemur Neck Right 06/18/2017 67.9 Normal -0.8 0.927 g/cm2 - -  DualFemur Total Mean 03/15/2020 70.6 Osteopenia -1.6 0.803 g/cm2 -4.6% Yes DualFemur Total Mean 10/13/2018 69.2 Osteopenia -1.3 0.842 g/cm2 -4.4% Yes DualFemur Total Mean 06/18/2017 67.9 Normal -1.0 0.881 g/cm2 - -  Left Forearm Radius 33% 03/15/2020 70.6 Osteoporosis -3.0 0.617 g/cm2 4.6% - Left Forearm Radius 33% 10/13/2018 69.2 Osteoporosis -3.3 0.590    Abdominal Ultrasound 11/05/2018  Mildly increased CBD diameter is likely a sequelae of prior cholecystectomy. There is no intrahepatic biliary  ductal dilatation to suggest acute bile duct obstruction. 2. Evidence of hepatic steatosis   ASSESSMENT/PLAN/RECOMMENDATIONS:   1. Osteoporosis :   - Left forearm T-score -3.0 . Pt at high risk for fracture especially at the forearms - I have encouraged her to continues with calcium, vitamin D and weight bearing exercises - Discussed fall precaution - Discussed different types of treatments , we have opted to start oral bisphosphonates -We discussed rare side effect of atypical fractures and osteonecrosis. Pt encouraged to have an updated dental exam - Pt to notify us with any side effects    Medications : Start Alendronate 70 mg weekly  Continue Calcium 1200 mg daily  Continue Vitamin D 2000 iu daily   2. Elevated Alkaline Phosphatase:  - Large part of this Is due to increased bone resorption secondary to osteoporosis, I am hoping that antiresorptive will improve this, we also discussed that part of Alk. Phos is coming from hepatic steatosis, and I have encouraged her with weight loss, avoiding fatty foods, eating fresh fruits/vegetables and lean meats   F/U in 6 months    Signed electronically by: Mack Guise, MD  [facility]  [facility] [address] [place], [place] [address] Phone: [phone] FAX: [phone]   CC: Burnard Hawthorne, FNP [address] [place] [address] Phone: [phone] Fax: [phone]   Return to Endocrinology clinic as below: Future Appointments  Date Time Provider [place]  06/11/2020  2:00 PM Damira Kem, Melanie Crazier, MD [facility] None  08/21/2020  8:00 AM Vidal Schwalbe Yvetta Coder, FNP [facility]

## 2020-06-11 NOTE — Patient Instructions (Signed)
-   Alendronate ( Fosamax) 70 mg , 1 tablet weekly , please drink with a full glass of water.  - Notify us with heart burn issues to change your medicine  - Continue Calcium and Vitamin D daily

## 2020-06-26 ENCOUNTER — Telehealth: Payer: Self-pay | Admitting: Family

## 2020-06-26 ENCOUNTER — Ambulatory Visit: Payer: Medicare Other

## 2020-06-26 ENCOUNTER — Telehealth: Payer: Self-pay

## 2020-06-26 NOTE — Telephone Encounter (Signed)
Call pt Has she returned cologuard  Yet?

## 2020-06-26 NOTE — Telephone Encounter (Signed)
Unable to reach patient. Left vm to call the office during allotted time or reschedule as appropriate.

## 2020-06-27 NOTE — Telephone Encounter (Signed)
Left a message to call back.

## 2020-07-03 NOTE — Telephone Encounter (Signed)
I called and patient she stated that she did return cologuard kit. They sent her back a letter stating that they were unable to process. I asked if we could order for patient again & she declined for now.

## 2020-07-04 NOTE — Telephone Encounter (Signed)
noted 

## 2020-07-20 ENCOUNTER — Ambulatory Visit (INDEPENDENT_AMBULATORY_CARE_PROVIDER_SITE_OTHER): Payer: Medicare Other

## 2020-07-20 ENCOUNTER — Other Ambulatory Visit: Payer: Self-pay

## 2020-07-20 DIAGNOSIS — Z23 Encounter for immunization: Secondary | ICD-10-CM | POA: Diagnosis not present

## 2020-07-30 ENCOUNTER — Ambulatory Visit: Payer: Medicare Other | Admitting: Family

## 2020-08-07 ENCOUNTER — Telehealth: Payer: Self-pay | Admitting: Family

## 2020-08-07 NOTE — Telephone Encounter (Signed)
Left message for patient to call back and schedule Medicare Annual Wellness Visit (AWV)   This should be a telephone visit only=30 minutes.  NO HX; please schedule at anytime with Denisa O'Brien-Blaney at [facility]. 

## 2020-08-20 ENCOUNTER — Ambulatory Visit: Payer: Medicare Other | Admitting: Internal Medicine

## 2020-08-21 ENCOUNTER — Ambulatory Visit: Payer: Medicare Other | Admitting: Family

## 2020-08-24 ENCOUNTER — Other Ambulatory Visit: Payer: Self-pay

## 2020-08-24 ENCOUNTER — Telehealth (INDEPENDENT_AMBULATORY_CARE_PROVIDER_SITE_OTHER): Payer: Medicare Other | Admitting: Family

## 2020-08-24 ENCOUNTER — Encounter: Payer: Self-pay | Admitting: Family

## 2020-08-24 DIAGNOSIS — D721 Eosinophilia, unspecified: Secondary | ICD-10-CM

## 2020-08-24 DIAGNOSIS — E785 Hyperlipidemia, unspecified: Secondary | ICD-10-CM | POA: Diagnosis not present

## 2020-08-24 DIAGNOSIS — M810 Age-related osteoporosis without current pathological fracture: Secondary | ICD-10-CM | POA: Diagnosis not present

## 2020-08-24 DIAGNOSIS — R748 Abnormal levels of other serum enzymes: Secondary | ICD-10-CM

## 2020-08-24 NOTE — Assessment & Plan Note (Signed)
On fosamax. Will follow.

## 2020-08-24 NOTE — Assessment & Plan Note (Signed)
Chronic, will follow

## 2020-08-24 NOTE — Assessment & Plan Note (Signed)
Chronic. Advised that we continue to monitor. She will return in 6 months to have f/u and CBC drawn.

## 2020-08-24 NOTE — Progress Notes (Signed)
Virtual Visit via Video Note  I connected with@  on 08/24/20 at 11:00 AM EDT by a video enabled telemedicine application and verified that I am speaking with the correct person using two identifiers.  Location patient: home Location provider:work  Persons participating in the virtual visit: patient, provider  I discussed the limitations of evaluation and management by telemedicine and the availability of in person appointments. The patient expressed understanding and agreed to proceed.   HPI: Feels well today No complaints  Has been doing water aerobics, walking and jogging.   HLD- compliant with lipitor.   Osteoporosis- started fosamax 06/2020 with endocrine. No problems swallowing swallowing   Elevated alk phos- consult with Shamleffer who felt related to bone resorption   Appears due for pneumococcal 23 Not sure which type of pneumonia vaccine that she had 10/2015 nor knows pharmacy to call She has had prevnar 13  Elevated eosinophils- chronic. Consulted with Dr Rogue Bussing 04/2020 whom didn't feel that it warranted further evaluation.   ROS: See pertinent positives and negatives per HPI.    EXAM:  VITALS per patient if applicable: Ht <[id number]>_0<\/[id number]>_5  (1.651 m)   Wt 171 lb (77.6 kg)   BMI 28.46 kg/m  BP Readings from Last 3 Encounters:  06/11/20 132/78  04/02/20 (!) 154/82  02/20/20 125/74   Wt Readings from Last 3 Encounters:  08/24/20 171 lb (77.6 kg)  06/11/20 172 lb 6.4 oz (78.2 kg)  04/02/20 173 lb (78.5 kg)    GENERAL: alert, oriented, appears well and in no acute distress  HEENT: atraumatic, conjunttiva clear, no obvious abnormalities on inspection of external nose and ears  NECK: normal movements of the head and neck  LUNGS: on inspection no signs of respiratory distress, breathing rate appears normal, no obvious gross SOB, gasping or wheezing  CV: no obvious cyanosis  MS: moves all visible extremities without noticeable abnormality  PSYCH/NEURO: pleasant  and cooperative, no obvious depression or anxiety, speech and thought processing grossly intact  ASSESSMENT AND PLAN:  Discussed the following assessment and plan:  Problem List Items Addressed This Visit      Musculoskeletal and Integument   Osteoporosis    On fosamax. Will follow.         Other   Elevated alkaline phosphatase level    Chronic, will follow      Eosinophilia    Chronic. Advised that we continue to monitor. She will return in 6 months to have f/u and CBC drawn.      Hyperlipidemia    Stable. Continue lipitor.          -we discussed possible serious and likely etiologies, options for evaluation and workup, limitations of telemedicine visit vs in person visit, treatment, treatment risks and precautions. Pt prefers to treat via telemedicine empirically rather then risking or undertaking an in person visit at this moment.  .   I discussed the assessment and treatment plan with the patient. The patient was provided an opportunity to ask questions and all were answered. The patient agreed with the plan and demonstrated an understanding of the instructions.   The patient was advised to call back or seek an in-person evaluation if the symptoms worsen or if the condition fails to improve as anticipated.   [name], FNP

## 2020-08-24 NOTE — Assessment & Plan Note (Signed)
Stable. Continue lipitor. 

## 2020-08-27 ENCOUNTER — Telehealth: Payer: Self-pay

## 2020-08-27 NOTE — Telephone Encounter (Signed)
Lvm for pt to schedule 2 weeks for due for pneumococcal 23 and 6 month follow up/bhp

## 2020-08-27 NOTE — Telephone Encounter (Signed)
error 

## 2020-12-17 ENCOUNTER — Ambulatory Visit: Payer: Medicare Other | Admitting: Internal Medicine

## 2020-12-18 ENCOUNTER — Telehealth: Payer: Self-pay | Admitting: Family

## 2020-12-18 NOTE — Telephone Encounter (Signed)
Left message for patient to call back and schedule Medicare Annual Wellness Visit (AWV)   This should be a telephone visit only=30 minutes.  NO HX; please schedule at anytime with Denisa O'Brien-Blaney at [facility].

## 2021-01-14 ENCOUNTER — Ambulatory Visit: Payer: Medicare Other | Admitting: Internal Medicine

## 2021-01-14 ENCOUNTER — Encounter: Payer: Self-pay | Admitting: Internal Medicine

## 2021-01-14 ENCOUNTER — Other Ambulatory Visit: Payer: Self-pay

## 2021-01-14 VITALS — BP 134/80 | HR 69 | Ht 65.0 in | Wt 169.5 lb

## 2021-01-14 DIAGNOSIS — K760 Fatty (change of) liver, not elsewhere classified: Secondary | ICD-10-CM | POA: Diagnosis not present

## 2021-01-14 DIAGNOSIS — R748 Abnormal levels of other serum enzymes: Secondary | ICD-10-CM | POA: Diagnosis not present

## 2021-01-14 DIAGNOSIS — M810 Age-related osteoporosis without current pathological fracture: Secondary | ICD-10-CM | POA: Diagnosis not present

## 2021-01-14 MED ORDER — ALENDRONATE SODIUM 70 MG PO TABS: 70.0000 mg | ORAL_TABLET | ORAL | 3 refills | Status: DC

## 2021-01-14 NOTE — Progress Notes (Signed)
Name: Brittany Farley  MRN/ DOB: [id number], [birth year]    Age/ Sex: 70 y.o., female     PCP: Burnard Hawthorne, FNP   Reason for Endocrinology Evaluation: Osteoporosis/elevated Alk. Phos      Initial Endocrinology Clinic Visit: 06/11/2020    PATIENT IDENTIFIER: Brittany Farley is a 70 y.o., female with a past medical history of low bone density and dyslipidemia. She has followed with [facility] since 06/11/2020 for consultative assistance with management of her Elevated Alk. Phos and osteoporosis  HISTORICAL SUMMARY:  Pt has been noted to have elevated Alkaline phosphatase since 2018 with fluctuating readings.  Alk. Phos isozymes showed 58% of bone origin and 42 % liver origin.   Pt was diagnosed with osteoporosis:2019  Menarche at age : 9 Menopausal at age : Hysterectomy less than 30 yrs Fracture Hx: no Hx of HRT: no FH of osteoporosis or hip fracture: Mother hip fracture     Started Alendronate 06/2020  SUBJECTIVE:    Today (01/14/2021):  [name] is here for a follow up on osteoporosis and elevated Alk. Phos.   She is tolerating Alendronate without side effects  Denies constipation or diarrhea   She has been with weight loss.    HOME ENDOCRINE MEDICATIONS Alendronate 70 mg weekly  Calcium 1200 mg daily  Vitamin D 2000 iu daily      HISTORY:  Past Medical History:  Past Medical History:  Diagnosis Date  . Arthritis    right hand  . Osteoporosis    Past Surgical History:  Past Surgical History:  Procedure Laterality Date  . ABDOMINAL HYSTERECTOMY  1980   had hysterectomy for fibroids. No cancer. Has both ovaries.   [facility] CATARACT EXTRACTION W/PHACO Right 02/20/2020   Procedure: CATARACT EXTRACTION PHACO AND INTRAOCULAR LENS PLACEMENT (IOC) RIGHT 2.33  00:24.3;  Surgeon: Eulogio Bear, MD;  Location: [place];  Service: Ophthalmology;  Laterality: Right;  . CATARACT EXTRACTION W/PHACO Left 04/02/2020   Procedure: CATARACT  EXTRACTION PHACO AND INTRAOCULAR LENS PLACEMENT (IOC) LEFT;  Surgeon: Eulogio Bear, MD;  Location: [place];  Service: Ophthalmology;  Laterality: Left;  1.68 0:28.8  . CHOLECYSTECTOMY  2011  . COLONOSCOPY WITH PROPOFOL N/A 11/29/2018   Procedure: COLONOSCOPY WITH PROPOFOL;  Surgeon: Virgel Manifold, MD;  Location: [facility];  Service: Endoscopy;  Laterality: N/A;  . OOPHORECTOMY      Social History:  reports that she has never smoked. She has never used smokeless tobacco. She reports current alcohol use. She reports that she does not use drugs. Family History:  Family History  Problem Relation Age of Onset  . Breast cancer Mother 60  . Breast cancer Maternal Aunt 70  . Lung cancer Sister   . Diabetes Sister   . Prostate cancer Brother   . Heart disease Brother   . Stroke Cousin   . Hypertension Cousin      HOME MEDICATIONS: Allergies as of 01/14/2021   No Known Allergies     Medication List       Accurate as of January 14, 2021  2:47 PM. If you have any questions, ask your nurse or doctor.        STOP taking these medications   prednisoLONE acetate 1 % ophthalmic suspension Commonly known as: PRED FORTE Stopped by: Dorita Sciara, MD     TAKE these medications   alendronate 70 MG tablet Commonly known as: FOSAMAX Take 1 tablet (70 mg total) by mouth every  7 (seven) days. Take with a full glass of water on an empty stomach.   aspirin EC 81 MG tablet Take 81 mg by mouth daily.   atorvastatin 10 MG tablet Commonly known as: LIPITOR TAKE 1 TABLET BY MOUTH  DAILY   CALCIUM 1200 PO Take by mouth in the morning and at bedtime.   MULTIVITAMIN ADULT PO Take by mouth.   Vitamin D3 50 MCG (2000 UT) Tabs Take by mouth.         OBJECTIVE:   PHYSICAL EXAM: VS: BP 134/80   Pulse 69   Ht 5' 5" (1.651 m)   Wt 169 lb 8 oz (76.9 kg)   SpO2 98%   BMI 28.21 kg/m    EXAM: General: Pt appears well and is in NAD  Neck: General:  Supple without adenopathy. Thyroid: Thyroid size normal.  No goiter or nodules appreciated. No thyroid bruit.  Lungs: Clear with good BS bilat with no rales, rhonchi, or wheezes  Heart: Auscultation: RRR.  Abdomen: Normoactive bowel sounds, soft, nontender, without masses or organomegaly palpable  Extremities:  BL LE: No pretibial edema normal ROM and strength.  Mental Status: Judgment, insight: Intact Orientation: Oriented to time, place, and person Memory: Intact for recent and remote events Mood and affect: No depression, anxiety, or agitation     DATA REVIEWED: DXA 03/15/2020  DualFemur Neck Right 03/15/2020 70.6 Osteopenia -1.3 0.854 g/cm2 -3.5% - DualFemur Neck Right 10/13/2018 69.2 Osteopenia -1.1 0.885 g/cm2 -4.5% - DualFemur Neck Right 06/18/2017 67.9 Normal -0.8 0.927 g/cm2 - -  DualFemur Total Mean 03/15/2020 70.6 Osteopenia -1.6 0.803 g/cm2 -4.6% Yes DualFemur Total Mean 10/13/2018 69.2 Osteopenia -1.3 0.842 g/cm2 -4.4% Yes DualFemur Total Mean 06/18/2017 67.9 Normal -1.0 0.881 g/cm2 - -  Left Forearm Radius 33% 03/15/2020 70.6 Osteoporosis -3.0 0.617 g/cm2 4.6% - Left Forearm Radius 33% 10/13/2018 69.2 Osteoporosis -3.3 0.590 g/cm2 - - Abdominal Ultrasound 11/05/2018  Mildly increased CBD diameter is likely a sequelae of prior cholecystectomy. There is no intrahepatic biliary ductal dilatation to suggest acute bile duct obstruction. 2. Evidence of hepatic steatosis  ASSESSMENT / PLAN / RECOMMENDATIONS:   Osteoporosis :   - Left forearm T-score -3.0 . Pt at high risk for fracture especially at the forearms - She has been doing well with exercise  - She is compliant with alendronate , calcium and vitamin D intake    Medications : Continue Alendronate 70 mg weekly  Continue Calcium 1200 mg daily  Continue Vitamin D 2000 iu daily   2. Elevated Alkaline Phosphatase:  - Large part of this Is due to increased bone resorption secondary to  osteoporosis,but we again discussed that part of Alk. Phos is coming from hepatic steatosis - I have praised her on weight loss ,she admits to eating sweets but she is motivated to reduce CHO intake, we also discussed  avoiding fatty foods, eating fresh fruits/vegetables and lean meats   3. Hepatic steatosis :    - Encouraged weight loss as above    Pt will stop by for labs as our phlebotomist is not available today    F/U in 1 yr  Signed electronically by: Abby Jaralla Shamleffer, MD  [facility]  [facility] [address] [place], [place] [address] Phone: [phone] FAX: [phone]      CC: Arnett, Margaret G, FNP [address] [place] [address] Phone: [phone]  Fax: [phone]   Return to Endocrinology clinic as below: No future appointments.   

## 2021-01-14 NOTE — Patient Instructions (Signed)
-  Please stop by the lab at your convenience

## 2021-01-21 ENCOUNTER — Encounter: Payer: Self-pay | Admitting: Internal Medicine

## 2021-01-21 ENCOUNTER — Other Ambulatory Visit: Payer: Self-pay

## 2021-01-21 ENCOUNTER — Other Ambulatory Visit (INDEPENDENT_AMBULATORY_CARE_PROVIDER_SITE_OTHER): Payer: Medicare Other

## 2021-01-21 DIAGNOSIS — M810 Age-related osteoporosis without current pathological fracture: Secondary | ICD-10-CM | POA: Diagnosis not present

## 2021-01-21 LAB — COMPREHENSIVE METABOLIC PANEL WITH GFR
ALT: 29 U/L (ref 0–35)
AST: 44 U/L — ABNORMAL HIGH (ref 0–37)
Albumin: 3.8 g/dL (ref 3.5–5.2)
Alkaline Phosphatase: 107 U/L (ref 39–117)
BUN: 18 mg/dL (ref 6–23)
CO2: 28 meq/L (ref 19–32)
Calcium: 9.2 mg/dL (ref 8.4–10.5)
Chloride: 103 meq/L (ref 96–112)
Creatinine, Ser: 0.68 mg/dL (ref 0.40–1.20)
GFR: 87.56 mL/min
Glucose, Bld: 89 mg/dL (ref 70–99)
Potassium: 4.9 meq/L (ref 3.5–5.1)
Sodium: 137 meq/L (ref 135–145)
Total Bilirubin: 0.4 mg/dL (ref 0.2–1.2)
Total Protein: 7.1 g/dL (ref 6.0–8.3)

## 2021-01-21 LAB — VITAMIN D 25 HYDROXY (VIT D DEFICIENCY, FRACTURES): VITD: 99.16 ng/mL (ref 30.00–100.00)

## 2021-01-22 LAB — PARATHYROID HORMONE, INTACT (NO CA): PTH: 49 pg/mL (ref 16–77)

## 2021-01-22 NOTE — Telephone Encounter (Signed)
Please let her know I have not had the chance to look at her results yet as Donnald Garre been seeing patients. I will get to them hopefully later today

## 2021-01-31 ENCOUNTER — Other Ambulatory Visit: Payer: Self-pay | Admitting: Family

## 2021-01-31 DIAGNOSIS — E785 Hyperlipidemia, unspecified: Secondary | ICD-10-CM

## 2021-04-15 ENCOUNTER — Ambulatory Visit (INDEPENDENT_AMBULATORY_CARE_PROVIDER_SITE_OTHER): Payer: Medicare Other

## 2021-04-15 VITALS — Ht 65.0 in | Wt 169.0 lb

## 2021-04-15 DIAGNOSIS — Z1231 Encounter for screening mammogram for malignant neoplasm of breast: Secondary | ICD-10-CM | POA: Diagnosis not present

## 2021-04-15 DIAGNOSIS — Z0000 Encounter for general adult medical examination without abnormal findings: Secondary | ICD-10-CM | POA: Diagnosis not present

## 2021-04-15 NOTE — Patient Instructions (Addendum)
Brittany Farley , Thank you for taking time to come for your Medicare Wellness Visit. I appreciate your ongoing commitment to your health goals. Please review the following plan we discussed and let me know if I can assist you in the future.   These are the goals we discussed: Goals    . Maintain Healthy Lifestyle     Stay active Healthy diet       This is a list of the screening recommended for you and due dates:  Health Maintenance  Topic Date Due  . Flu Shot  06/10/2021  . Mammogram  03/15/2022  . Tetanus Vaccine  03/18/2027  . Colon Cancer Screening  11/29/2028  . DEXA scan (bone density measurement)  Completed  . COVID-19 Vaccine  Completed  . Hepatitis C Screening: USPSTF Recommendation to screen - Ages 14-79 yo.  Completed  . Pneumonia vaccines  Completed  . Zoster (Shingles) Vaccine  Completed  . Pneumococcal Vaccination  Aged Out  . HPV Vaccine  Aged Out    Immunizations Immunization History  Administered Date(s) Administered  . Fluad Quad(high Dose 65+) 07/13/2019, 07/20/2020  . Influenza, High Dose Seasonal PF 08/25/2017, 09/03/2018  . PFIZER(Purple Top)SARS-COV-2 Vaccination 12/08/2019, 12/30/2019, 08/10/2020  . Pneumococcal Conjugate-13 04/01/2017  . Pneumococcal-Unspecified 10/23/2015  . Tdap 03/17/2017  . Zoster Recombinat (Shingrix) 04/03/2017, 06/03/2017   Advanced directives: not yet completed.  Conditions/risks identified: none new.  Follow up in one year for your annual wellness visit.  Call [facility] for scheduling.    Preventive Care 72 Years and Older, Female Preventive care refers to lifestyle choices and visits with your health care provider that can promote health and wellness. What does preventive care include?  A yearly physical exam. This is also called an annual well check.  Dental exams once or twice a year.  Routine eye exams. Ask your health care provider how often you should have your eyes checked.  Personal  lifestyle choices, including:  Daily care of your teeth and gums.  Regular physical activity.  Eating a healthy diet.  Avoiding tobacco and drug use.  Limiting alcohol use.  Practicing safe sex.  Taking low-dose aspirin every day.  Taking vitamin and mineral supplements as recommended by your health care provider. What happens during an annual well check? The services and screenings done by your health care provider during your annual well check will depend on your age, overall health, lifestyle risk factors, and family history of disease. Counseling  Your health care provider may ask you questions about your:  Alcohol use.  Tobacco use.  Drug use.  Emotional well-being.  Home and relationship well-being.  Sexual activity.  Eating habits.  History of falls.  Memory and ability to understand (cognition).  Work and work Statistician.  Reproductive health. Screening  You may have the following tests or measurements:  Height, weight, and BMI.  Blood pressure.  Lipid and cholesterol levels. These may be checked every 5 years, or more frequently if you are over 31 years old.  Skin check.  Lung cancer screening. You may have this screening every year starting at age 25 if you have a 30-pack-year history of smoking and currently smoke or have quit within the past 15 years.  Fecal occult blood test (FOBT) of the stool. You may have this test every year starting at age 22.  Flexible sigmoidoscopy or colonoscopy. You may have a sigmoidoscopy every 5 years or a colonoscopy every 10 years starting at age 89.  Hepatitis  C blood test.  Hepatitis B blood test.  Sexually transmitted disease (STD) testing.  Diabetes screening. This is done by checking your blood sugar (glucose) after you have not eaten for a while (fasting). You may have this done every 1-3 years.  Bone density scan. This is done to screen for osteoporosis. You may have this done starting at age  33.  Mammogram. This may be done every 1-2 years. Talk to your health care provider about how often you should have regular mammograms. Talk with your health care provider about your test results, treatment options, and if necessary, the need for more tests. Vaccines  Your health care provider may recommend certain vaccines, such as:  Influenza vaccine. This is recommended every year.  Tetanus, diphtheria, and acellular pertussis (Tdap, Td) vaccine. You may need a Td booster every 10 years.  Zoster vaccine. You may need this after age 81.  Pneumococcal 13-valent conjugate (PCV13) vaccine. One dose is recommended after age 72.  Pneumococcal polysaccharide (PPSV23) vaccine. One dose is recommended after age 36. Talk to your health care provider about which screenings and vaccines you need and how often you need them. This information is not intended to replace advice given to you by your health care provider. Make sure you discuss any questions you have with your health care provider. Document Released: 11/23/2015 Document Revised: 07/16/2016 Document Reviewed: 08/28/2015 Elsevier Interactive Patient Education  2017 [employer] Prevention in the Home Falls can cause injuries. They can happen to people of all ages. There are many things you can do to make your home safe and to help prevent falls. What can I do on the outside of my home?  Regularly fix the edges of walkways and driveways and fix any cracks.  Remove anything that might make you trip as you walk through a door, such as a raised step or threshold.  Trim any bushes or trees on the path to your home.  Use bright outdoor lighting.  Clear any walking paths of anything that might make someone trip, such as rocks or tools.  Regularly check to see if handrails are loose or broken. Make sure that both sides of any steps have handrails.  Any raised decks and porches should have guardrails on the edges.  Have any  leaves, snow, or ice cleared regularly.  Use sand or salt on walking paths during winter.  Clean up any spills in your garage right away. This includes oil or grease spills. What can I do in the bathroom?  Use night lights.  Install grab bars by the toilet and in the tub and shower. Do not use towel bars as grab bars.  Use non-skid mats or decals in the tub or shower.  If you need to sit down in the shower, use a plastic, non-slip stool.  Keep the floor dry. Clean up any water that spills on the floor as soon as it happens.  Remove soap buildup in the tub or shower regularly.  Attach bath mats securely with double-sided non-slip rug tape.  Do not have throw rugs and other things on the floor that can make you trip. What can I do in the bedroom?  Use night lights.  Make sure that you have a light by your bed that is easy to reach.  Do not use any sheets or blankets that are too big for your bed. They should not hang down onto the floor.  Have a firm chair that has side arms.  You can use this for support while you get dressed.  Do not have throw rugs and other things on the floor that can make you trip. What can I do in the kitchen?  Clean up any spills right away.  Avoid walking on wet floors.  Keep items that you use a lot in easy-to-reach places.  If you need to reach something above you, use a strong step stool that has a grab bar.  Keep electrical cords out of the way.  Do not use floor polish or wax that makes floors slippery. If you must use wax, use non-skid floor wax.  Do not have throw rugs and other things on the floor that can make you trip. What can I do with my stairs?  Do not leave any items on the stairs.  Make sure that there are handrails on both sides of the stairs and use them. Fix handrails that are broken or loose. Make sure that handrails are as long as the stairways.  Check any carpeting to make sure that it is firmly attached to the stairs.  Fix any carpet that is loose or worn.  Avoid having throw rugs at the top or bottom of the stairs. If you do have throw rugs, attach them to the floor with carpet tape.  Make sure that you have a light switch at the top of the stairs and the bottom of the stairs. If you do not have them, ask someone to add them for you. What else can I do to help prevent falls?  Wear shoes that:  Do not have high heels.  Have rubber bottoms.  Are comfortable and fit you well.  Are closed at the toe. Do not wear sandals.  If you use a stepladder:  Make sure that it is fully opened. Do not climb a closed stepladder.  Make sure that both sides of the stepladder are locked into place.  Ask someone to hold it for you, if possible.  Clearly mark and make sure that you can see:  Any grab bars or handrails.  First and last steps.  Where the edge of each step is.  Use tools that help you move around (mobility aids) if they are needed. These include:  Canes.  Walkers.  Scooters.  Crutches.  Turn on the lights when you go into a dark area. Replace any light bulbs as soon as they burn out.  Set up your furniture so you have a clear path. Avoid moving your furniture around.  If any of your floors are uneven, fix them.  If there are any pets around you, be aware of where they are.  Review your medicines with your doctor. Some medicines can make you feel dizzy. This can increase your chance of falling. Ask your doctor what other things that you can do to help prevent falls. This information is not intended to replace advice given to you by your health care provider. Make sure you discuss any questions you have with your health care provider. Document Released: 08/23/2009 Document Revised: 04/03/2016 Document Reviewed: 12/01/2014 Elsevier Interactive Patient Education  2017 [employer].

## 2021-04-15 NOTE — Progress Notes (Addendum)
Subjective:   Brittany Farley is a 70 y.o. female who presents for an Initial Medicare Annual Wellness Visit.  Review of Systems    No ROS.  Medicare Wellness Virtual Visit.  Visual/audio telehealth visit, UTA vital signs.   See social history for additional risk factors.   Cardiac Risk Factors include: advanced age (>21men, >11 women)     Objective:    Today's Vitals   04/15/21 0823  Weight: 169 lb (76.7 kg)  Height: 5\' 5"  (1.651 m)   Body mass index is 28.12 kg/m.  Advanced Directives 04/15/2021 04/02/2020 02/20/2020 11/29/2018  Does Patient Have a Medical Advance Directive? No Yes Yes No  Type of Advance Directive - Living will;Healthcare Power of [place];Living will -  Does patient want to make changes to medical advance directive? - No - Patient declined No - Patient declined -  Copy of [place] in Chart? - No - copy requested No - copy requested -  Would patient like information on creating a medical advance directive? No - Patient declined No - Patient declined - -    Current Medications (verified) Outpatient Encounter Medications as of 04/15/2021  Medication Sig  . alendronate (FOSAMAX) 70 MG tablet Take 1 tablet (70 mg total) by mouth once a week. Take with a full glass of water on an empty stomach.  Marland Kitchen aspirin EC 81 MG tablet Take 81 mg by mouth daily.  Marland Kitchen atorvastatin (LIPITOR) 10 MG tablet TAKE 1 TABLET BY MOUTH  DAILY  . Calcium Carbonate-Vit D-Min (CALCIUM 1200 PO) Take by mouth in the morning and at bedtime.  . Cholecalciferol (VITAMIN D3) 2000 units TABS Take by mouth.  . Multiple Vitamin (MULTIVITAMIN ADULT PO) Take by mouth.   No facility-administered encounter medications on file as of 04/15/2021.    Allergies (verified) Patient has no known allergies.   History: Past Medical History:  Diagnosis Date  . Arthritis    right hand  . Osteoporosis    Past Surgical History:  Procedure Laterality Date  .  ABDOMINAL HYSTERECTOMY  1980   had hysterectomy for fibroids. No cancer. Has both ovaries.   Marland Kitchen CATARACT EXTRACTION W/PHACO Right 02/20/2020   Procedure: CATARACT EXTRACTION PHACO AND INTRAOCULAR LENS PLACEMENT (IOC) RIGHT 2.33  00:24.3;  Surgeon: Eulogio Bear, MD;  Location: [facility];  Service: Ophthalmology;  Laterality: Right;  . CATARACT EXTRACTION W/PHACO Left 04/02/2020   Procedure: CATARACT EXTRACTION PHACO AND INTRAOCULAR LENS PLACEMENT (IOC) LEFT;  Surgeon: Eulogio Bear, MD;  Location: [facility];  Service: Ophthalmology;  Laterality: Left;  1.68 0:28.8  . CHOLECYSTECTOMY  2011  . COLONOSCOPY WITH PROPOFOL N/A 11/29/2018   Procedure: COLONOSCOPY WITH PROPOFOL;  Surgeon: Virgel Manifold, MD;  Location: [facility];  Service: Endoscopy;  Laterality: N/A;  . OOPHORECTOMY     Family History  Problem Relation Age of Onset  . Breast cancer Mother 42  . Breast cancer Maternal Aunt 70  . Lung cancer Sister   . Diabetes Sister   . Prostate cancer Brother   . Heart disease Brother   . Stroke Cousin   . Hypertension Cousin    Social History   Socioeconomic History  . Marital status: Single    Spouse name: Not on file  . Number of children: Not on file  . Years of education: Not on file  . Highest education level: Not on file  Occupational History  . Not on file  Tobacco Use  .  Smoking status: Never Smoker  . Smokeless tobacco: Never Used  Vaping Use  . Vaping Use: Never used  Substance and Sexual Activity  . Alcohol use: Yes    Comment: occasional - 1-2 drinks/year  . Drug use: Never  . Sexual activity: Not on file  Other Topics Concern  . 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       Social Determinants of Health   Financial Resource Strain: Low Risk   . Difficulty of Paying Living Expenses: Not hard at all  Food  Insecurity: No Food Insecurity  . Worried About Charity fundraiser in the Last Year: Never true  . Ran Out of Food in the Last Year: Never true  Transportation Needs: No Transportation Needs  . Lack of Transportation (Medical): No  . Lack of Transportation (Non-Medical): No  Physical Activity: Not on file  Stress: No Stress Concern Present  . Feeling of Stress : Not at all  Social Connections: Unknown  . Frequency of Communication with Friends and Family: More than three times a week  . Frequency of Social Gatherings with Friends and Family: More than three times a week  . Attends Religious Services: Not on file  . Active Member of Clubs or Organizations: Not on file  . Attends Archivist Meetings: Not on file  . Marital Status: Not on file    Tobacco Counseling Counseling given: Not Answered   Clinical Intake:  Pre-visit preparation completed: Yes        Diabetes: No  How often do you need to have someone help you when you read instructions, pamphlets, or other written materials from your doctor or pharmacy?: 1 - Never   Interpreter Needed?: No      Activities of Daily Living In your present state of health, do you have any difficulty performing the following activities: 04/15/2021  Hearing? N  Vision? N  Difficulty concentrating or making decisions? N  Walking or climbing stairs? N  Dressing or bathing? N  Doing errands, shopping? N  Preparing Food and eating ? N  Using the Toilet? N  In the past six months, have you accidently leaked urine? N  Do you have problems with loss of bowel control? N  Managing your Medications? N  Managing your Finances? N  Housekeeping or managing your Housekeeping? N  Some recent data might be hidden    Patient Care Team: Burnard Hawthorne, FNP as PCP - General (Family Medicine)  Indicate any recent Medical Services you may have received from other than [facility] providers in the past year (date may be approximate).      Assessment:   This is a routine wellness examination for [facility].  I connected with Brittany Farley today by telephone and verified that I am speaking with the correct person using two identifiers. Location patient: home Location provider: work Persons participating in the virtual visit: patient, Marine scientist.    I discussed the limitations, risks, security and privacy concerns of performing an evaluation and management service by telephone and the availability of in person appointments. The patient expressed understanding and verbally consented to this telephonic visit.    Interactive audio and video telecommunications were attempted between this provider and patient, however failed, due to patient having technical difficulties OR patient did not have access to video capability.  We continued and completed visit with  audio only.  Some vital signs may be absent or patient reported.   Hearing/Vision screen  Hearing Screening   125Hz  250Hz  500Hz  1000Hz  2000Hz  3000Hz  4000Hz  6000Hz  8000Hz   Right ear:           Left ear:           Comments: Patient is able to hear conversational tones without difficulty.  No issues reported.   Vision Screening Comments: Followed by [facility], Dr. Edison Pace Wears reader lenses only Cataract extraction, bilateral They have seen their ophthalmologist in the last 12 months.     Dietary issues and exercise activities discussed: Current Exercise Habits: Home exercise routine, Time (Minutes): 60, Frequency (Times/Week): 7, Weekly Exercise (Minutes/Week): 420, Intensity: Intense  Healthy diet Good water intake  Goals Addressed            This Visit's Progress   . Maintain Healthy Lifestyle       Stay active Healthy diet      Depression Screen PHQ 2/9 Scores 04/15/2021 02/15/2020 09/03/2018 06/25/2017 04/01/2017  PHQ - 2 Score 0 0 0 0 0  PHQ- 9 Score - 2 1 - -    Fall Risk Fall Risk  04/15/2021 02/15/2020 06/13/2019 06/25/2017 04/01/2017  Falls in the past year? 0 0  (No Data) No No  Comment - - Emmi Telephone Survey: data to providers prior to load - -  Number falls in past yr: 0 - (No Data) - -  Comment - - Emmi Telephone Survey Actual Response =  - -  Injury with Fall? 0 - - - -  Follow up Falls evaluation completed Falls evaluation completed - - -    FALL RISK PREVENTION PERTAINING TO THE HOME: Handrails in use when climbing stairs? Yes Home free of loose throw rugs in walkways, pet beds, electrical cords, etc? Yes  Adequate lighting in your home to reduce risk of falls? Yes   ASSISTIVE DEVICES UTILIZED TO PREVENT FALLS: Life alert? No  Use of a cane, walker or w/c? No   TIMED UP AND GO: Was the test performed? No .   Cognitive Function:  Patient is alert and oriented x3.  Denies difficulty focusing, making decisions, memory loss.  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  MMSE/6CIT deferred. Normal by direct communication/observation.       Immunizations Immunization History  Administered Date(s) Administered  . Fluad Quad(high Dose 65+) 07/13/2019, 07/20/2020  . Influenza, High Dose Seasonal PF 08/25/2017, 09/03/2018  . PFIZER(Purple Top)SARS-COV-2 Vaccination 12/08/2019, 12/30/2019, 08/10/2020  . Pneumococcal Conjugate-13 04/01/2017  . Pneumococcal-Unspecified 10/23/2015  . Tdap 03/17/2017  . Zoster Recombinat (Shingrix) 04/03/2017, 06/03/2017    Health Maintenance  There are no preventive care reminders to display for this patient.  Health Maintenance  Topic Date Due  . INFLUENZA VACCINE  06/10/2021  . MAMMOGRAM  03/15/2022  . TETANUS/TDAP  03/18/2027  . COLONOSCOPY (Pts 45-88yrs Insurance coverage will need to be confirmed)  11/29/2028  . DEXA SCAN  Completed  . COVID-19 Vaccine  Completed  . Hepatitis C Screening  Completed  . PNA vac Low Risk Adult  Completed  . Zoster Vaccines- Shingrix  Completed  . Pneumococcal Vaccine 0-64 Years  old  Aged Out  . HPV VACCINES  Aged Out   Colorectal cancer screening: Type of screening: Colonoscopy. Completed 2020. Repeat every 10 years  Mammogram status: Completed 03/2020. Repeat every year . Ordered.   Bone Density status: Completed  03/2020. Results reflect: Bone density results: OSTEOPOROSIS. Repeat every 2 years.  Taking Fosamax as directed.   Lung Cancer Screening: (Low Dose CT Chest recommended if Age 82-80 years, 30 pack-year currently smoking OR have quit w/in 15years.) does not qualify.   Vision Screening: Recommended annual ophthalmology exams for early detection of glaucoma and other disorders of the eye. Is the patient up to date with their annual eye exam?  Yes   Dental Screening: Recommended annual dental exams for proper oral hygiene. Routine visits.   Community Resource Referral / Chronic Care Management: CRR required this visit?  No   CCM required this visit?  No      Plan:   Keep all routine maintenance appointments.   I have personally reviewed and noted the following in the patient's chart:   . Medical and social history . Use of alcohol, tobacco or illicit drugs  . Current medications and supplements including opioid prescriptions. Patient is not currently taking opioid prescriptions. . Functional ability and status . Nutritional status . Physical activity . Advanced directives . List of other physicians . Hospitalizations, surgeries, and ER visits in previous 12 months . Vitals . Screenings to include cognitive, depression, and falls . Referrals and appointments  In addition, I have reviewed and discussed with patient certain preventive protocols, quality metrics, and best practice recommendations. A written personalized care plan for preventive services as well as general preventive health recommendations were provided to patient via mychart.     Varney Biles, LPN   02/10/8767     Agree with plan. [name], NP

## 2021-04-26 ENCOUNTER — Encounter: Payer: Medicare Other | Admitting: Family

## 2021-05-31 ENCOUNTER — Encounter: Payer: Medicare Other | Admitting: Family

## 2021-06-18 ENCOUNTER — Encounter: Payer: Self-pay | Admitting: Family

## 2021-06-28 ENCOUNTER — Encounter: Payer: Medicare Other | Admitting: Family

## 2021-07-05 ENCOUNTER — Other Ambulatory Visit: Payer: Self-pay

## 2021-07-05 ENCOUNTER — Ambulatory Visit
Admission: RE | Admit: 2021-07-05 | Discharge: 2021-07-05 | Disposition: A | Discharge status: Home / Self Care | Payer: Medicare Other | Source: Ambulatory Visit | Attending: Family | Admitting: Family

## 2021-07-05 DIAGNOSIS — Z1231 Encounter for screening mammogram for malignant neoplasm of breast: Secondary | ICD-10-CM | POA: Insufficient documentation

## 2021-07-17 IMAGING — MG MM DIGITAL DIAGNOSTIC UNILAT*L* W/ TOMO W/ CAD
6 series · 6 of 18 positions shown · non-contrast
Comparison: Previous exam(s).

CLINICAL DATA: 68-year-old female presenting as a recall from
screening for possible left breast mass and left breast asymmetry.

EXAM:
DIGITAL DIAGNOSTIC LEFT MAMMOGRAM
ULTRASOUND LEFT BREAST

[L CC synth-2D (1 of 2)]
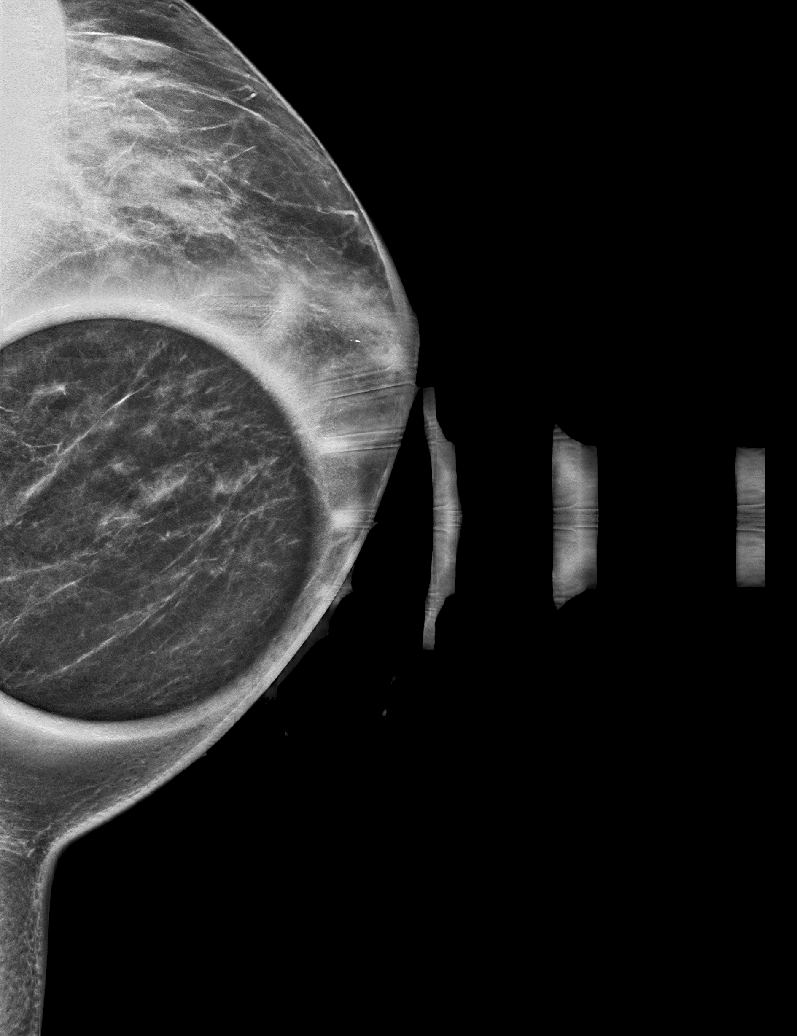

[L CC synth-2D (2 of 2)]
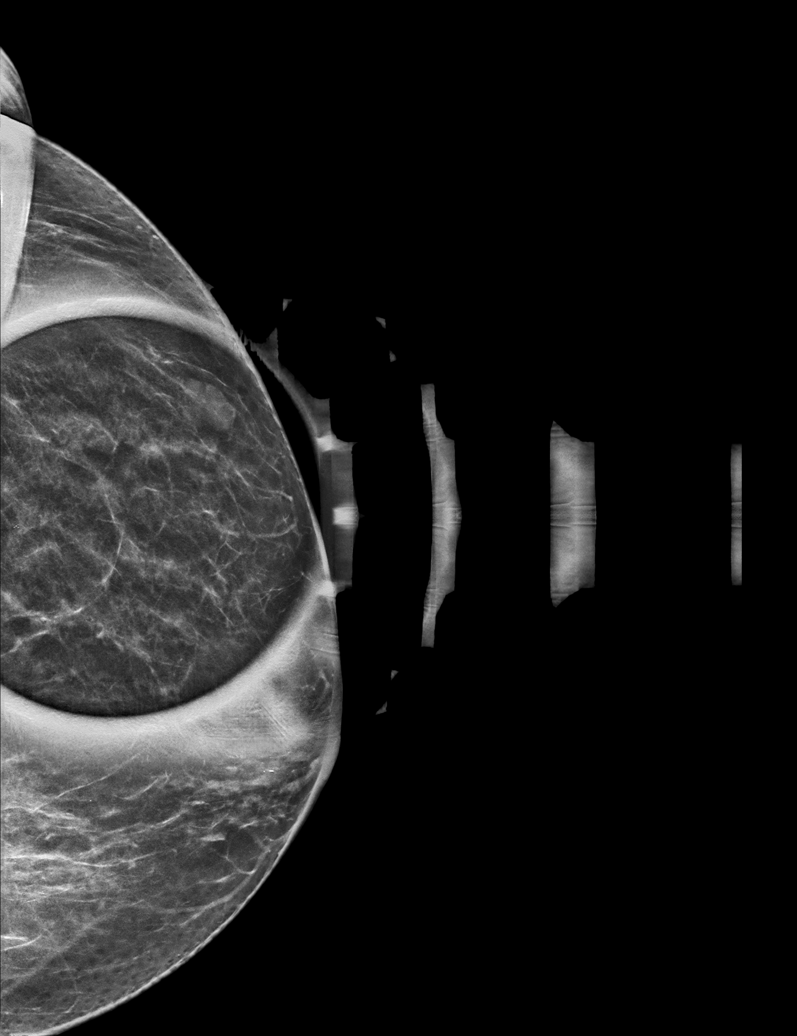

[L MLO synth-2D]
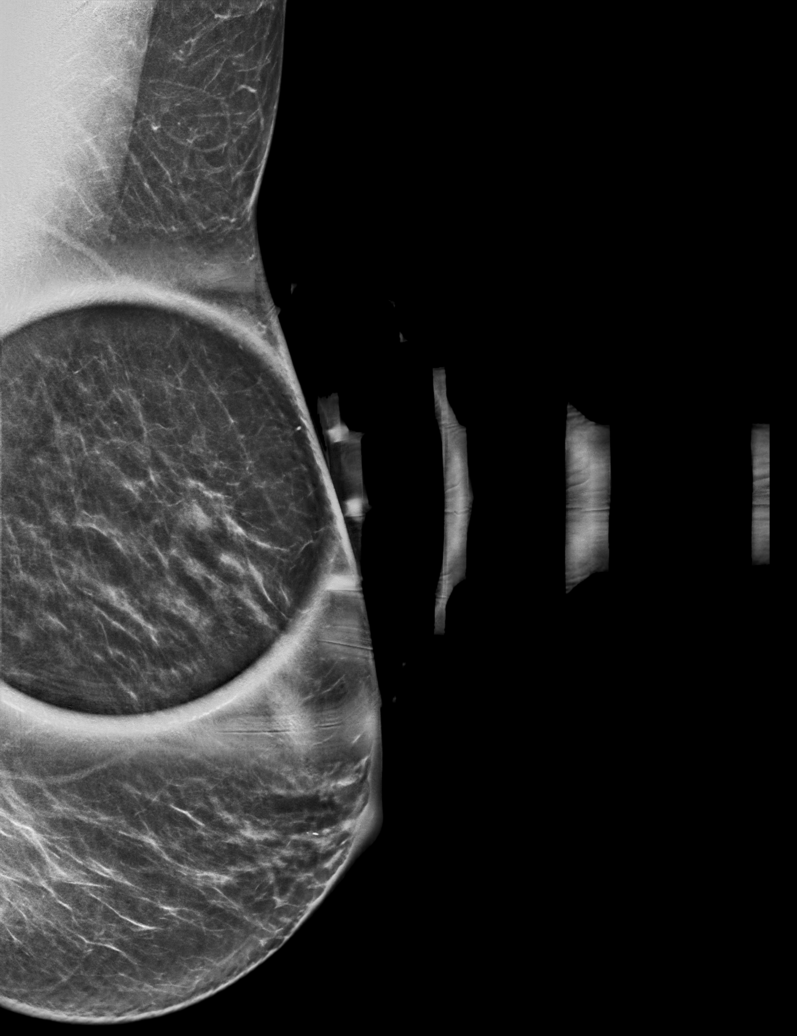

[L CC tomo (1 of 2) · tomo slice 28/55.0]
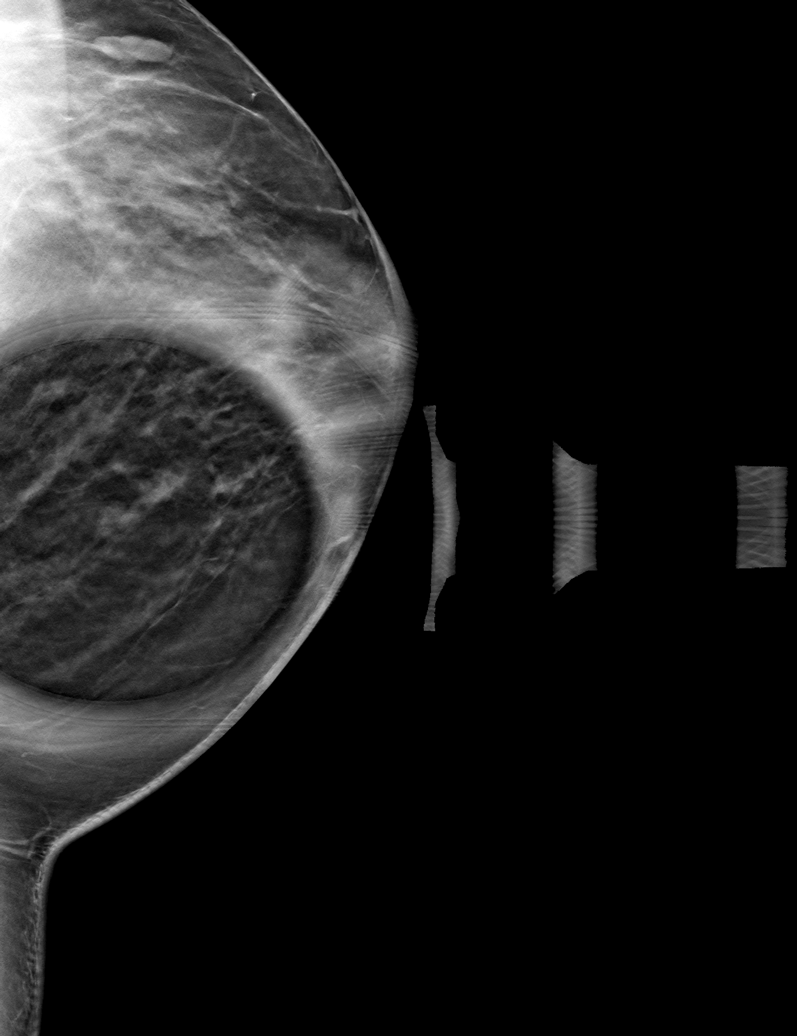

[L CC tomo (2 of 2) · tomo slice 26/51.0]
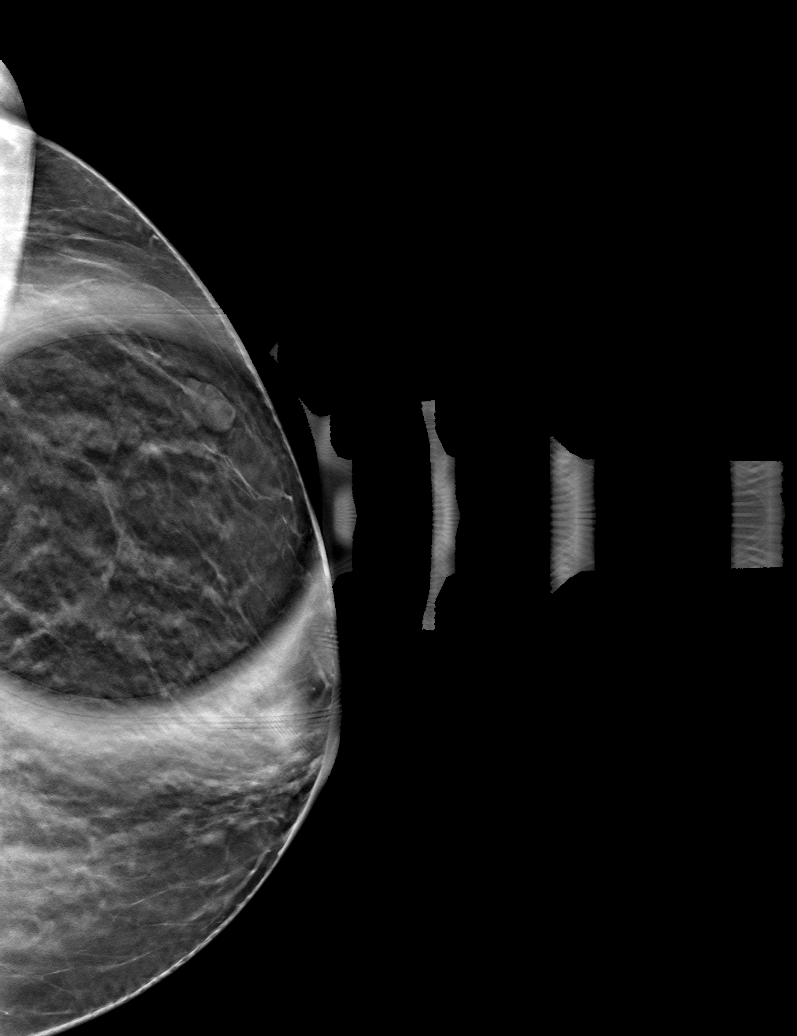

[L MLO tomo · tomo slice 33/65.0]
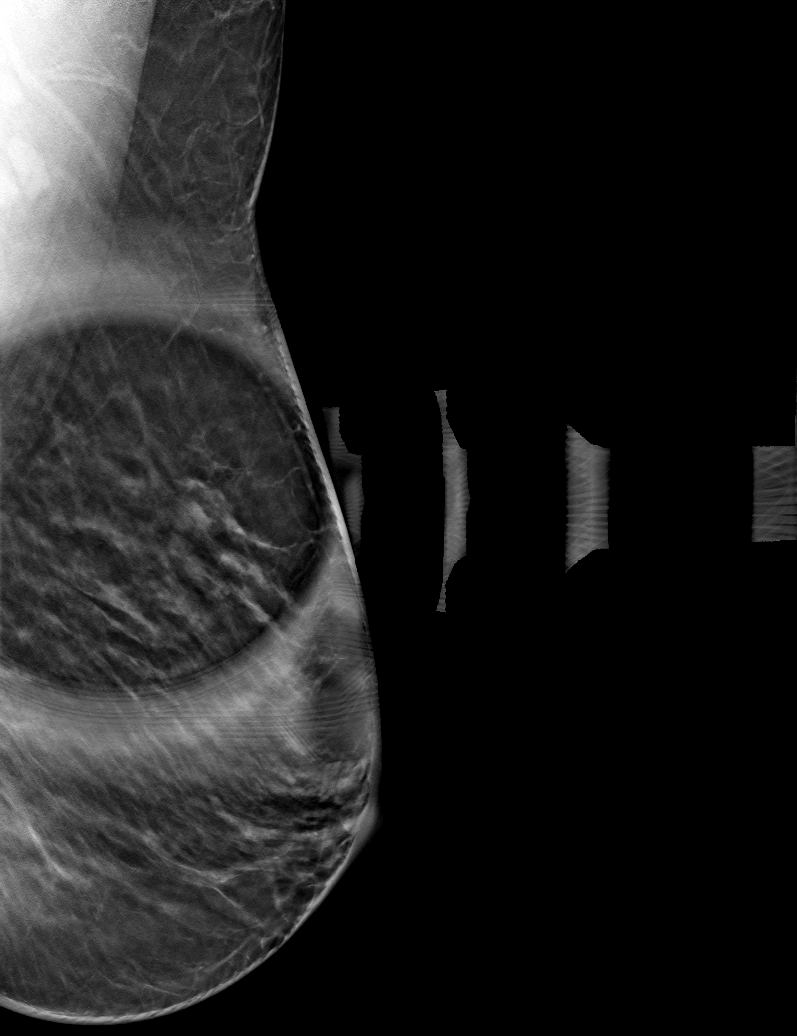

[6 of 18 positions shown; findings below may reference images not displayed]

ACR Breast Density Category c: The breast tissue is heterogeneously
dense, which may obscure small masses.
FINDINGS: Mammogram:

Spot compression tomosynthesis views of the left breast were
performed. On the additional imaging there is persistence of an oval
circumscribed mass in the upper outer quadrant of the left breast
measuring approximately 1.3 cm. Just inferior to this there is
another oval circumscribed mass measuring 0.5 cm. The asymmetry
questioned in the medial aspect of the left breast does not persist
on additional imaging, consistent with normal overlapping
fibroglandular tissue.

Ultrasound:

Targeted ultrasound is performed in the left breast at 2 o'clock 5
cm from the nipple demonstrating an oval circumscribed anechoic mass
measuring 1.2 x 0.5 x 1.1 cm, consistent with a benign simple cyst.
Superior to this at 1:30 o'clock 6 cm from the nipple there is an
oval circumscribed hypoechoic mass with thin cortex, consistent with
a benign intramammary lymph node. This measures 0.5 x 0.3 x 0.4 cm.
These masses correspond to the mammographic findings.
IMPRESSION: Left breast mass at 2 o'clock measuring 1.1 cm consistent with a
benign simple cyst. Left breast mass at 1:30 o'clock measuring
cm consistent with a benign intramammary lymph node. No mammographic
or sonographic evidence of malignancy.

RECOMMENDATION:
Screening mammogram in one year.(Code:[id number])

I have discussed the findings and recommendations with the patient.
If applicable, a reminder letter will be sent to the patient
regarding the next appointment.

BI-RADS CATEGORY  2: Benign.

## 2021-07-26 ENCOUNTER — Encounter: Payer: Self-pay | Admitting: Family

## 2021-07-26 ENCOUNTER — Ambulatory Visit (INDEPENDENT_AMBULATORY_CARE_PROVIDER_SITE_OTHER): Payer: Medicare Other | Admitting: Family

## 2021-07-26 ENCOUNTER — Other Ambulatory Visit: Payer: Self-pay

## 2021-07-26 VITALS — BP 120/66 | HR 56 | Temp 97.8°F | Ht 65.0 in | Wt 164.0 lb

## 2021-07-26 DIAGNOSIS — M810 Age-related osteoporosis without current pathological fracture: Secondary | ICD-10-CM | POA: Diagnosis not present

## 2021-07-26 DIAGNOSIS — E785 Hyperlipidemia, unspecified: Secondary | ICD-10-CM

## 2021-07-26 DIAGNOSIS — Z0000 Encounter for general adult medical examination without abnormal findings: Secondary | ICD-10-CM | POA: Diagnosis not present

## 2021-07-26 NOTE — Assessment & Plan Note (Signed)
Controlled.  Continue Crestor 10 mg

## 2021-07-26 NOTE — Assessment & Plan Note (Signed)
Compliant with Fosamax.  She will continue to follow with endocrine, will follow

## 2021-07-26 NOTE — Assessment & Plan Note (Addendum)
Clinical breast exam performed today.  Deferred pelvic exam in the absence of complaints and patient is no longer screening for cervical cancer screening.  Discussed whether or not she needs to see Dr. Dahlia Byes, and I have sent a note to Dr Bonna Gains regarding this clarification.

## 2021-07-26 NOTE — Progress Notes (Signed)
Subjective:    Patient ID: Sury Fidalgo, female    DOB: [birth year], 70 y.o.   MRN: [id number]  CC: Kopelynn Babel is a 70 y.o. female who presents today for physical exam.    HPI: Feels well today No complaints  HLD-Compliant with Crestor '10mg'$     Colorectal Cancer Screening: UTD , referred to Dr. Dahlia Byes in regards to polyp v hypertrophic polyp   Breast Cancer Screening: Mammogram UTD Cervical Cancer Screening: She is no longer screening for cervical cancer.  History of hysterectomy for noncancerous reasons Bone Health screening/DEXA for 65+: Known osteoporosis.  She is following with endocrinology for this.  Compliant with Fosamax.  Lung Cancer Screening: Doesn't have 20 year pack year history and age > 5 years yo 23 years       Tetanus - UTD          Exercise: Gets regular exercise daily.  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  On off days, she exercises at the gym No cp, sob.  Alcohol use:  Occasional Smoking/tobacco use: Nonsmoker.     HISTORY:  Past Medical History:  Diagnosis Date   Arthritis    right hand   Osteoporosis     Past Surgical History:  Procedure Laterality Date   ABDOMINAL HYSTERECTOMY  1980   had hysterectomy for fibroids. No cancer. Has both ovaries.    CATARACT EXTRACTION W/PHACO Right 02/20/2020   Procedure: CATARACT EXTRACTION PHACO AND INTRAOCULAR LENS PLACEMENT ([facility]) RIGHT 2.33  00:24.3;  Surgeon: Eulogio Bear, MD;  Location: [place];  Service: Ophthalmology;  Laterality: Right;   CATARACT EXTRACTION W/PHACO Left 04/02/2020   Procedure: CATARACT EXTRACTION PHACO AND INTRAOCULAR LENS PLACEMENT ([place]) LEFT;  Surgeon: Eulogio Bear, MD;  Location: [place];  Service: Ophthalmology;  Laterality: Left;  1.68 0:28.8   CHOLECYSTECTOMY  2011   COLONOSCOPY WITH PROPOFOL N/A 11/29/2018   Procedure: COLONOSCOPY WITH PROPOFOL;  Surgeon: Virgel Manifold, MD;  Location: [facility];  Service: Endoscopy;   Laterality: N/A;   OOPHORECTOMY     Family History  Problem Relation Age of Onset   Breast cancer Mother 47   Breast cancer Maternal Aunt 48   Lung cancer Sister    Diabetes Sister    Prostate cancer Brother    Heart disease Brother    Stroke Cousin    Hypertension Cousin       ALLERGIES: Patient has no known allergies.  Current Outpatient Medications on File Prior to Visit  Medication Sig Dispense Refill   alendronate (FOSAMAX) 70 MG tablet Take 1 tablet (70 mg total) by mouth once a week. Take with a full glass of water on an empty stomach. 13 tablet 3   aspirin EC 81 MG tablet Take 81 mg by mouth daily.     atorvastatin (LIPITOR) 10 MG tablet TAKE 1 TABLET BY MOUTH  DAILY 90 tablet 3   Calcium Carbonate-Vit D-Min (CALCIUM 1200 PO) Take by mouth in the morning and at bedtime.     Cholecalciferol (VITAMIN D3) 2000 units TABS Take by mouth.     Multiple Vitamin (MULTIVITAMIN ADULT PO) Take by mouth.     No current facility-administered medications on file prior to visit.    Social History   Tobacco Use   Smoking status: Never   Smokeless tobacco: Never  Vaping Use   Vaping Use: Never used  Substance Use Topics   Alcohol use: Yes    Comment: occasional - 1-2  drinks/year   Drug use: Never    Review of Systems  Constitutional:  Negative for chills, fever and unexpected weight change.  HENT:  Negative for congestion.   Respiratory:  Negative for cough.   Cardiovascular:  Negative for chest pain, palpitations and leg swelling.  Gastrointestinal:  Negative for nausea and vomiting.  Musculoskeletal:  Negative for arthralgias and myalgias.  Skin:  Negative for rash.  Neurological:  Negative for headaches.  Hematological:  Negative for adenopathy.  Psychiatric/Behavioral:  Negative for confusion.      Objective:    BP 120/66 (BP Location: Left Arm, Patient Position: Sitting, Cuff Size: Normal)   Pulse (!) 56   Temp 97.8 F (36.6 C) (Oral)   Ht '5\' 5"'$  (1.651 m)   Wt  164 lb (74.4 kg)   SpO2 99%   BMI 27.29 kg/m   BP Readings from Last 3 Encounters:  07/26/21 120/66  01/14/21 134/80  06/11/20 132/78   Wt Readings from Last 3 Encounters:  07/26/21 164 lb (74.4 kg)  04/15/21 169 lb (76.7 kg)  01/14/21 169 lb 8 oz (76.9 kg)    Physical Exam Vitals reviewed.  Constitutional:      Appearance: Normal appearance. She is well-developed.  Eyes:     Conjunctiva/sclera: Conjunctivae normal.  Neck:     Thyroid: No thyroid mass or thyromegaly.  Cardiovascular:     Rate and Rhythm: Normal rate and regular rhythm.     Pulses: Normal pulses.     Heart sounds: Normal heart sounds.  Pulmonary:     Effort: Pulmonary effort is normal.     Breath sounds: Normal breath sounds. No wheezing, rhonchi or rales.  Chest:  Breasts:    Breasts are symmetrical.     Right: No inverted nipple, mass, nipple discharge, skin change or tenderness.     Left: No inverted nipple, mass, nipple discharge, skin change or tenderness.  Abdominal:     General: Bowel sounds are normal. There is no distension.     Palpations: Abdomen is soft. Abdomen is not rigid. There is no fluid wave or mass.     Tenderness: There is no abdominal tenderness. There is no guarding or rebound.  Lymphadenopathy:     Head:     Right side of head: No submental, submandibular, tonsillar, preauricular, posterior auricular or occipital adenopathy.     Left side of head: No submental, submandibular, tonsillar, preauricular, posterior auricular or occipital adenopathy.     Cervical: No cervical adenopathy.     Right cervical: No superficial, deep or posterior cervical adenopathy.    Left cervical: No superficial, deep or posterior cervical adenopathy.  Skin:    General: Skin is warm and dry.  Neurological:     Mental Status: She is alert.  Psychiatric:        Speech: Speech normal.        Behavior: Behavior normal.        Thought Content: Thought content normal.       Assessment & Plan:    Problem List Items Addressed This Visit       Musculoskeletal and Integument   Osteoporosis    Compliant with Fosamax.  She will continue to follow with endocrine, will follow        Other   Hyperlipidemia    Controlled.  Continue Crestor 10 mg      Routine physical examination - Primary    Clinical breast exam performed today.  Deferred pelvic exam in the absence  of complaints and patient is no longer screening for cervical cancer screening.  Discussed whether or not she needs to see Dr. Dahlia Byes, and I have sent a note to Dr Bonna Gains regarding this clarification.       Relevant Orders   VITAMIN D 25 Hydroxy (Vit-D Deficiency, Fractures)   Hemoglobin A1c   TSH   CBC with Differential/Platelet   Lipid panel   Comprehensive metabolic panel     I am having [name] maintain her Vitamin D3, aspirin EC, Multiple Vitamin (MULTIVITAMIN ADULT PO), Calcium Carbonate-Vit D-Min (CALCIUM 1200 PO), alendronate, and atorvastatin.   No orders of the defined types were placed in this encounter.   Return precautions given.   Risks, benefits, and alternatives of the medications and treatment plan prescribed today were discussed, and patient expressed understanding.   Education regarding symptom management and diagnosis given to patient on AVS.   Continue to follow with Burnard Hawthorne, FNP for routine health maintenance.   [name] and I agreed with plan.   [name], FNP

## 2021-07-26 NOTE — Patient Instructions (Signed)
I have sent a message today to gastroenterology, Dr. Bonna Gains, in regards to colonoscopy, specifically as it relates to note that you were referred to Dr. Dahlia Byes in regards to polyp v hypertrophic polyp .   Please reach out to my office next week if you have not heard back in regards to this.  Nice to see you!  Health Maintenance for Postmenopausal Women Menopause is a normal process in which your ability to get pregnant comes to an end. This process happens slowly over many months or years, usually between the ages of 72 and 52. Menopause is complete when you have missed your menstrual periods for 12 months. It is important to talk with your health care provider about some of the most common conditions that affect women after menopause (postmenopausal women). These include heart disease, cancer, and bone loss (osteoporosis). Adopting a healthy lifestyle and getting preventive care can help to promote your health and wellness. The actions you take can also lower your chances of developing some of these common conditions. What should I know about menopause? During menopause, you may get a number of symptoms, such as: Hot flashes. These can be moderate or severe. Night sweats. Decrease in sex drive. Mood swings. Headaches. Tiredness. Irritability. Memory problems. Insomnia. Choosing to treat or not to treat these symptoms is a decision that you make with your health care provider. Do I need hormone replacement therapy? Hormone replacement therapy is effective in treating symptoms that are caused by menopause, such as hot flashes and night sweats. Hormone replacement carries certain risks, especially as you become older. If you are thinking about using estrogen or estrogen with progestin, discuss the benefits and risks with your health care provider. What is my risk for heart disease and stroke? The risk of heart disease, heart attack, and stroke increases as you age. One of the causes may be a  change in the body's hormones during menopause. This can affect how your body uses dietary fats, triglycerides, and cholesterol. Heart attack and stroke are medical emergencies. There are many things that you can do to help prevent heart disease and stroke. Watch your blood pressure High blood pressure causes heart disease and increases the risk of stroke. This is more likely to develop in people who have high blood pressure readings, are of African descent, or are overweight. Have your blood pressure checked: Every 3-5 years if you are 31-55 years of age. Every year if you are 35 years old or older. Eat a healthy diet  Eat a diet that includes plenty of vegetables, fruits, low-fat dairy products, and lean protein. Do not eat a lot of foods that are high in solid fats, added sugars, or sodium. Get regular exercise Get regular exercise. This is one of the most important things you can do for your health. Most adults should: Try to exercise for at least 150 minutes each week. The exercise should increase your heart rate and make you sweat (moderate-intensity exercise). Try to do strengthening exercises at least twice each week. Do these in addition to the moderate-intensity exercise. Spend less time sitting. Even light physical activity can be beneficial. Other tips Work with your health care provider to achieve or maintain a healthy weight. Do not use any products that contain nicotine or tobacco, such as cigarettes, e-cigarettes, and chewing tobacco. If you need help quitting, ask your health care provider. Know your numbers. Ask your health care provider to check your cholesterol and your blood sugar (glucose). Continue to have  your blood tested as directed by your health care provider. Do I need screening for cancer? Depending on your health history and family history, you may need to have cancer screening at different stages of your life. This may include screening for: Breast  cancer. Cervical cancer. Lung cancer. Colorectal cancer. What is my risk for osteoporosis? After menopause, you may be at increased risk for osteoporosis. Osteoporosis is a condition in which bone destruction happens more quickly than new bone creation. To help prevent osteoporosis or the bone fractures that can happen because of osteoporosis, you may take the following actions: If you are 7-35 years old, get at least 1,000 mg of calcium and at least 600 mg of vitamin D per day. If you are older than age 60 but younger than age 25, get at least 1,200 mg of calcium and at least 600 mg of vitamin D per day. If you are older than age 63, get at least 1,200 mg of calcium and at least 800 mg of vitamin D per day. Smoking and drinking excessive alcohol increase the risk of osteoporosis. Eat foods that are rich in calcium and vitamin D, and do weight-bearing exercises several times each week as directed by your health care provider. How does menopause affect my mental health? Depression may occur at any age, but it is more common as you become older. Common symptoms of depression include: Low or sad mood. Changes in sleep patterns. Changes in appetite or eating patterns. Feeling an overall lack of motivation or enjoyment of activities that you previously enjoyed. Frequent crying spells. Talk with your health care provider if you think that you are experiencing depression. General instructions See your health care provider for regular wellness exams and vaccines. This may include: Scheduling regular health, dental, and eye exams. Getting and maintaining your vaccines. These include: Influenza vaccine. Get this vaccine each year before the flu season begins. Pneumonia vaccine. Shingles vaccine. Tetanus, diphtheria, and pertussis (Tdap) booster vaccine. Your health care provider may also recommend other immunizations. Tell your health care provider if you have ever been abused or do not feel safe  at home. Summary Menopause is a normal process in which your ability to get pregnant comes to an end. This condition causes hot flashes, night sweats, decreased interest in sex, mood swings, headaches, or lack of sleep. Treatment for this condition may include hormone replacement therapy. Take actions to keep yourself healthy, including exercising regularly, eating a healthy diet, watching your weight, and checking your blood pressure and blood sugar levels. Get screened for cancer and depression. Make sure that you are up to date with all your vaccines. This information is not intended to replace advice given to you by your health care provider. Make sure you discuss any questions you have with your health care provider. Document Revised: 10/20/2018 Document Reviewed: 10/20/2018 Elsevier Patient Education  2022 [employer].

## 2021-07-31 ENCOUNTER — Other Ambulatory Visit: Payer: Medicare Other

## 2021-09-02 ENCOUNTER — Ambulatory Visit: Payer: Medicare Other | Admitting: Surgery

## 2021-10-31 ENCOUNTER — Other Ambulatory Visit (INDEPENDENT_AMBULATORY_CARE_PROVIDER_SITE_OTHER): Payer: Medicare Other

## 2021-10-31 ENCOUNTER — Telehealth: Payer: Self-pay | Admitting: Family

## 2021-10-31 ENCOUNTER — Other Ambulatory Visit: Payer: Self-pay

## 2021-10-31 DIAGNOSIS — Z0000 Encounter for general adult medical examination without abnormal findings: Secondary | ICD-10-CM

## 2021-10-31 DIAGNOSIS — M810 Age-related osteoporosis without current pathological fracture: Secondary | ICD-10-CM | POA: Diagnosis not present

## 2021-10-31 DIAGNOSIS — E785 Hyperlipidemia, unspecified: Secondary | ICD-10-CM

## 2021-10-31 NOTE — Telephone Encounter (Signed)
Patient having issue with pain in both  legs , fell to floor early morn around mid night feel like muscle spasm. Walking was hard ,Patient had hold on things to get to destination ,xfer access nurse

## 2021-10-31 NOTE — Telephone Encounter (Signed)
Access nurse called with pt on line. States pt should be seen within 24 hours. Only appt available is with [place] 12pm. Pt is also scheduled for labs today 12/22 at 3:15

## 2021-11-01 ENCOUNTER — Ambulatory Visit (INDEPENDENT_AMBULATORY_CARE_PROVIDER_SITE_OTHER): Payer: Medicare Other | Admitting: Family Medicine

## 2021-11-01 ENCOUNTER — Encounter: Payer: Self-pay | Admitting: Family Medicine

## 2021-11-01 DIAGNOSIS — M79605 Pain in left leg: Secondary | ICD-10-CM

## 2021-11-01 DIAGNOSIS — M79604 Pain in right leg: Secondary | ICD-10-CM

## 2021-11-01 LAB — COMPREHENSIVE METABOLIC PANEL
ALT: 25 U/L (ref 0–35)
AST: 37 U/L (ref 0–37)
Albumin: 3.8 g/dL (ref 3.5–5.2)
Alkaline Phosphatase: 81 U/L (ref 39–117)
BUN: 19 mg/dL (ref 6–23)
CO2: 26 mEq/L (ref 19–32)
Calcium: 9.5 mg/dL (ref 8.4–10.5)
Chloride: 106 mEq/L (ref 96–112)
Creatinine, Ser: 0.79 mg/dL (ref 0.40–1.20)
GFR: 74.8 mL/min (ref >60.00)
Glucose, Bld: 100 mg/dL — ABNORMAL HIGH (ref 70–99)
Potassium: 4.4 mEq/L (ref 3.5–5.1)
Sodium: 139 mEq/L (ref 135–145)
Total Bilirubin: 0.4 mg/dL (ref 0.2–1.2)
Total Protein: 6.7 g/dL (ref 6.0–8.3)

## 2021-11-01 LAB — CBC WITH DIFFERENTIAL/PLATELET
Basophils Absolute: 0 10*3/uL (ref 0.0–0.1)
Basophils Relative: 0.9 % (ref 0.0–3.0)
Eosinophils Absolute: 0.2 10*3/uL (ref 0.0–0.7)
Eosinophils Relative: 4.8 % (ref 0.0–5.0)
HCT: 41.5 % (ref 36.0–46.0)
Hemoglobin: 13.3 g/dL (ref 12.0–15.0)
Lymphocytes Relative: 35.7 % (ref 12.0–46.0)
Lymphs Abs: 1.8 10*3/uL (ref 0.7–4.0)
MCHC: 32.2 g/dL (ref 30.0–36.0)
MCV: 79.6 fl (ref 78.0–100.0)
Monocytes Absolute: 0.4 10*3/uL (ref 0.1–1.0)
Monocytes Relative: 8.5 % (ref 3.0–12.0)
Neutro Abs: 2.5 10*3/uL (ref 1.4–7.7)
Neutrophils Relative %: 50.1 % (ref 43.0–77.0)
Platelets: 317 10*3/uL (ref 150.0–400.0)
RBC: 5.21 Mil/uL — ABNORMAL HIGH (ref 3.87–5.11)
RDW: 13.5 % (ref 11.5–15.5)
WBC: 5 10*3/uL (ref 4.0–10.5)

## 2021-11-01 LAB — LIPID PANEL
Cholesterol: 157 mg/dL (ref 0–200)
HDL: 73.1 mg/dL (ref >39.00)
LDL Cholesterol: 72 mg/dL (ref 0–99)
NonHDL: 83.52
Total CHOL/HDL Ratio: 2
Triglycerides: 59 mg/dL (ref 0.0–149.0)
VLDL: 11.8 mg/dL (ref 0.0–40.0)

## 2021-11-01 LAB — TSH: TSH: 0.96 u[IU]/mL (ref 0.35–5.50)

## 2021-11-01 LAB — HEMOGLOBIN A1C: Hgb A1c MFr Bld: 6 % (ref 4.6–6.5)

## 2021-11-01 LAB — VITAMIN D 25 HYDROXY (VIT D DEFICIENCY, FRACTURES): VITD: 99 ng/mL (ref 30.00–100.00)

## 2021-11-01 NOTE — Patient Instructions (Signed)
Nice to see you. Please monitor for recurrence of your symptoms.  If they do recur please let us know. Please monitor for any back pain or any new symptoms.   Somebody will contact you with your lab results when they return.

## 2021-11-01 NOTE — Assessment & Plan Note (Signed)
The patient had a relatively short-lived episode of bilateral leg pain when getting out of bed.  At this time I do not have a specific cause for this.  She has a benign exam today.  Her symptoms have resolved and have not recurred.  She has been on Lipitor and Fosamax though she has been on those for quite some time with no prior side effects.  Discussed at this point we would need to see what her labs reveal regarding her electrolytes.  This lab work was drawn yesterday and has not resulted yet.  If her electrolytes are normal I do not think that there is anything else that needs to be done at this time unless she has recurrence of symptoms.

## 2021-11-01 NOTE — Progress Notes (Signed)
Tommi Rumps, MD Phone: [phone]  Brittany Farley is a 70 y.o. female who presents today for same-day visit.  Leg pain: Patient notes she woke up a couple of nights ago with a discomfort in one of her legs.  She notes this occurs a couple of times of year and she is usually able to get up and walk it out.  When she got out of bed and placed weight on her legs she developed significant sharp discomfort in her bilateral legs.  This included the entire circumference of her legs and the upper and lower legs.  She notes she went to the ground with this though did not trip and fall.  There was no injury related to going to the ground.  She had no pain before going to bed.  She notes the discomfort resolved after about 20 minutes.  It was not consistent with a cramp.  She has had no recurrence.  She has no back pain.  No numbness or weakness.  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  Social History   Tobacco Use  Smoking Status Never  Smokeless Tobacco Never    Current Outpatient Medications on File Prior to Visit  Medication Sig Dispense Refill   alendronate (FOSAMAX) 70 MG tablet Take 1 tablet (70 mg total) by mouth once a week. Take with a full glass of water on an empty stomach. 13 tablet 3   aspirin EC 81 MG tablet Take 81 mg by mouth daily.     atorvastatin (LIPITOR) 10 MG tablet TAKE 1 TABLET BY MOUTH  DAILY 90 tablet 3   Calcium Carbonate-Vit D-Min (CALCIUM 1200 PO) Take by mouth in the morning and at bedtime.     Cholecalciferol (VITAMIN D3) 2000 units TABS Take by mouth.     Multiple Vitamin (MULTIVITAMIN ADULT PO) Take by mouth.     No current facility-administered medications on file prior to visit.     ROS see history of present illness  Objective  Physical Exam Vitals:   11/01/21 1204  BP: 138/70  Pulse: 61  Temp: 98.2 F (36.8 C)  SpO2: 97%    BP Readings from Last 3 Encounters:  11/01/21 138/70  07/26/21 120/66   01/14/21 134/80   Wt Readings from Last 3 Encounters:  11/01/21 155 lb 6.4 oz (70.5 kg)  07/26/21 164 lb (74.4 kg)  04/15/21 169 lb (76.7 kg)    Physical Exam Constitutional:      General: She is not in acute distress.    Appearance: She is not diaphoretic.  Cardiovascular:     Rate and Rhythm: Normal rate and regular rhythm.     Heart sounds: Normal heart sounds.  Pulmonary:     Effort: Pulmonary effort is normal.     Breath sounds: Normal breath sounds.  Musculoskeletal:     Comments: No muscular leg tenderness  Skin:    General: Skin is warm and dry.  Neurological:     Mental Status: She is alert.     Comments: 5/5 strength bilateral quads, hamstrings, plantar flexion, and dorsiflexion, sensation to light touch intact bilateral lower extremities     Assessment/Plan: Please see individual problem list.  Problem List Items Addressed This Visit     Leg pain, bilateral    The patient had a relatively short-lived episode of bilateral leg pain when getting out of bed.  At this time I do not have a specific cause for this.  She has  a benign exam today.  Her symptoms have resolved and have not recurred.  She has been on Lipitor and Fosamax though she has been on those for quite some time with no prior side effects.  Discussed at this point we would need to see what her labs reveal regarding her electrolytes.  This lab work was drawn yesterday and has not resulted yet.  If her electrolytes are normal I do not think that there is anything else that needs to be done at this time unless she has recurrence of symptoms.       Return if symptoms worsen or fail to improve.  This visit occurred during the SARS-CoV-2 public health emergency.  Safety protocols were in place, including screening questions prior to the visit, additional usage of staff PPE, and extensive cleaning of exam room while observing appropriate contact time as indicated for disinfecting solutions.    Tommi Rumps, MD [place]

## 2021-11-12 ENCOUNTER — Other Ambulatory Visit: Payer: Self-pay

## 2021-11-12 ENCOUNTER — Telehealth: Payer: Self-pay

## 2021-11-12 DIAGNOSIS — D721 Eosinophilia, unspecified: Secondary | ICD-10-CM

## 2021-11-12 NOTE — Telephone Encounter (Signed)
LMTCB for lab results. Just needs CBC scheduled in 3-4 months anyone can schedule.

## 2021-11-18 ENCOUNTER — Other Ambulatory Visit: Payer: Self-pay

## 2021-11-18 ENCOUNTER — Encounter: Payer: Self-pay | Admitting: Surgery

## 2021-11-18 ENCOUNTER — Telehealth: Payer: Self-pay | Admitting: Surgery

## 2021-11-18 ENCOUNTER — Ambulatory Visit: Payer: Medicare Other | Admitting: Surgery

## 2021-11-18 VITALS — BP 141/82 | HR 67 | Temp 98.4°F | Ht 65.0 in | Wt 158.4 lb

## 2021-11-18 DIAGNOSIS — K621 Rectal polyp: Secondary | ICD-10-CM | POA: Diagnosis not present

## 2021-11-18 NOTE — Patient Instructions (Signed)
Our surgery scheduler Pamala Hurry will call you within 24-48 hours to get you scheduled. If you have not heard from her after 48 hours, please call our office. You will not need to get Covid tested before surgery and have the blue sheet available when she calls to write down important information.   If you have any concerns or questions, please feel free to call our office.  Skin Tag Removal Wound Care instructions  Sometimes bandages are not necessary.  If a bandage is used, leave the original bandage on for 24 hours if possible.  If the bandage becomes soaked or soiled before that time, it is OK to remove it and examine the wound.  A small amount of post-operative bleeding is normal.  If excessive bleeding occurs, remove the bandage, place gauze over the site and apply continuous pressure (no peeking) over the area for 20-30 minutes.  If this does not stop the bleeding, try again for 40 minutes.  If this does not work, please call our clinic as soon as possible (even if after hours).    Once a day, cleanse the wound with soap and water.  If a thick crust develops you may use a Q-tip dipped into dilute hydrogen peroxide (mix 1:1 with water) to dissolve it.  Hydrogen peroxide can slow the healing process, so use it only as needed.  After washing, apply Vaseline jelly or Polysporin ointment.  For best healing, the wound should be covered with a layer of ointment at all times.  This may mean re-applying the ointment several times a day.  For open wounds, continue until it has healed.    If you have any swelling, keep the area elevated.  Some redness, tenderness and white or yellow material in the wound is normal healing.  If the area becomes very sore and red, or develops a thick yellow-green material (pus), it may be infected; please notify us.    Wound healing continues for up to one year following surgery.  It is not unusual to experience pain in the scar from time to time during the interval.  If the  pain becomes severe or the scar thickens, you should notify the office.  A slight amount of redness in a scar is expected for the first six months.  After six months, the redness subsides and the scar will soften and fade.  The color difference becomes less noticeable with time.  If there are any problems, return for a post-op surgery check at your earliest convenience.  It is important to wear sunscreen daily with an SPF of 30 or higher over the treated sites and to wear sun-protective clothing.  Please call our office for any questions or concerns.

## 2021-11-18 NOTE — H&P (View-Only) (Signed)
Patient ID: Brittany Farley, female   DOB: [birth year], 71 y.o.   MRN: [id number]  HPI Brittany Farley is a 71 y.o. female seen in consultation at the request of Mrs. Arnett.  He had a colonoscopy by Dr. Bonna Gains 3 years ago specifically 11/29/2018 please note that I personally reviewed the images showing a 5 mm polypoid lesions near the anal verge on retroflexion he had 4 Ohler colon final pathology showed hyperplastic polyps.  Most recently about a month ago she had significant anorectal pain that it was sharp and a fullness sensation.  She denies any bleeding.  She did have relief after she had a bowel movement.  She denies any fevers and chills.  She is very healthy and is able to perform more than 6 METS of activity without any shortness of breath or chest pain.  She goes to the gym regularly and exercises.  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 She was not aware of the rectal polyp up to few weeks ago when primary care provider noticed the report on the colonoscopy. CBC shows a hemoglobin of 13.3 normal white count normal platelets.  CMP is completely normal. Takes baby aspirin and a statin. He has a history of abdominal hysterectomy as well as a cholecystectomy.  HPI  Past Medical History:  Diagnosis Date   Arthritis    right hand   Osteoporosis     Past Surgical History:  Procedure Laterality Date   ABDOMINAL HYSTERECTOMY  1980   had hysterectomy for fibroids. No cancer. Has both ovaries.    CATARACT EXTRACTION W/PHACO Right 02/20/2020   Procedure: CATARACT EXTRACTION PHACO AND INTRAOCULAR LENS PLACEMENT ([facility]) RIGHT 2.33  00:24.3;  Surgeon: Eulogio Bear, MD;  Location: [place];  Service: Ophthalmology;  Laterality: Right;   CATARACT EXTRACTION W/PHACO Left 04/02/2020   Procedure: CATARACT EXTRACTION PHACO AND INTRAOCULAR LENS PLACEMENT ([place]) LEFT;  Surgeon: Eulogio Bear, MD;  Location: [place];  Service: Ophthalmology;  Laterality:  Left;  1.68 0:28.8   CHOLECYSTECTOMY  2011   COLONOSCOPY WITH PROPOFOL N/A 11/29/2018   Procedure: COLONOSCOPY WITH PROPOFOL;  Surgeon: Virgel Manifold, MD;  Location: [facility];  Service: Endoscopy;  Laterality: N/A;   OOPHORECTOMY      Family History  Problem Relation Age of Onset   Breast cancer Mother 17   Breast cancer Maternal Aunt 72   Lung cancer Sister    Diabetes Sister    Prostate cancer Brother    Heart disease Brother    Stroke Cousin    Hypertension Cousin     Social History Social History   Tobacco Use   Smoking status: Never   Smokeless tobacco: Never  Vaping Use   Vaping Use: Never used  Substance Use Topics   Alcohol use: Yes    Comment: occasional - 1-2 drinks/year   Drug use: Never    No Known Allergies  Current Outpatient Medications  Medication Sig Dispense Refill   alendronate (FOSAMAX) 70 MG tablet Take 1 tablet (70 mg total) by mouth once a week. Take with a full glass of water on an empty stomach. 13 tablet 3   aspirin EC 81 MG tablet Take 81 mg by mouth daily.     atorvastatin (LIPITOR) 10 MG tablet TAKE 1 TABLET BY MOUTH  DAILY 90 tablet 3   Calcium Carbonate-Vit D-Min (CALCIUM 1200 PO) Take by mouth in the morning and at bedtime.     Cholecalciferol (VITAMIN D3) 2000  units TABS Take by mouth.     Multiple Vitamin (MULTIVITAMIN ADULT PO) Take by mouth.     No current facility-administered medications for this visit.     Review of Systems Full ROS  was asked and was negative except for the information on the HPI  Physical Exam Blood pressure (!) 141/82, pulse 67, temperature 98.4 F (36.9 C), temperature source Oral, height 5\' 5"  (1.651 m), weight 158 lb 6.4 oz (71.8 kg), SpO2 100 %. CONSTITUTIONAL: NAD. EYES: Pupils are equal, round,  Sclera are non-icteric. EARS, NOSE, MOUTH AND THROAT: She is wearing a mask.The oral mucosa is pink and moist. Hearing is intact to voice. LYMPH NODES:  Lymph nodes in the neck are  normal. RESPIRATORY:  Lungs are clear. There is normal respiratory effort, with equal breath sounds bilaterally, and without pathologic use of accessory muscles. CARDIOVASCULAR: Heart is regular without murmurs, gallops, or rubs. GI: The abdomen is  soft, nontender, and nondistended. There are no palpable masses. There is no hepatosplenomegaly. There are normal bowel sounds in all quadrants.  Rectal: There is skin tags on left and right lateral areas.  Exam is uncomfortable.  There are no evidence of any palpable masses.  There is no blood Anoscopy: Anoscope was placed transrectally.  Patient has some discomfort but there was no evidence of any obvious lesions.  Limited due to discomfort   MUSCULOSKELETAL: Normal muscle strength and tone. No cyanosis or edema.   SKIN: Turgor is good and there are no pathologic skin lesions or ulcers. NEUROLOGIC: Motor and sensation is grossly normal. Cranial nerves are grossly intact. PSYCH:  Oriented to person, place and time. Affect is normal.  Data Reviewed  I have personally reviewed the patient's imaging, laboratory findings and medical records.    Assessment/Plan 71 year old female with rectal polyp.  On physical exam I am not concerned about malignancy.  Discussed with the patient in detail.  Given limitations on bedside exam I definitely recommend exam under anesthesia and excision of polypoid lesion within the rectum.  I do think that we can do this transrectally.  Procedure discussed with the patient in detail.  Risks, benefits and possible implications including but not limited to: Bleeding, infection, chronic pain and upgrading of pathology.  She understands and wished to proceed.   Please note that I spent greater than 60 minutes in this encounter , including personally reviewing imaging studies, records, placing orders, counseling the patient and performing appropriate documentation.    Caroleen Hamman, MD FACS General Surgeon 11/18/2021, 10:56 AM

## 2021-11-18 NOTE — Progress Notes (Signed)
Patient ID: Brittany Farley, female   DOB: February 20, 1951, 71 y.o.   MRN: [id number]  HPI Brittany Farley is a 71 y.o. female seen in consultation at the request of Mrs. Arnett.  He had a colonoscopy by Dr. Bonna Gains 3 years ago specifically 11/29/2018 please note that I personally reviewed the images showing a 5 mm polypoid lesions near the anal verge on retroflexion he had 4 Ohler colon final pathology showed hyperplastic polyps.  Most recently about a month ago she had significant anorectal pain that it was sharp and a fullness sensation.  She denies any bleeding.  She did have relief after she had a bowel movement.  She denies any fevers and chills.  She is very healthy and is able to perform more than 6 METS of activity without any shortness of breath or chest pain.  She goes to the gym regularly and exercises.  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 She was not aware of the rectal polyp up to few weeks ago when primary care provider noticed the report on the colonoscopy. CBC shows a hemoglobin of 13.3 normal white count normal platelets.  CMP is completely normal. Takes baby aspirin and a statin. He has a history of abdominal hysterectomy as well as a cholecystectomy.  HPI  Past Medical History:  Diagnosis Date   Arthritis    right hand   Osteoporosis     Past Surgical History:  Procedure Laterality Date   ABDOMINAL HYSTERECTOMY  1980   had hysterectomy for fibroids. No cancer. Has both ovaries.    CATARACT EXTRACTION W/PHACO Right 02/20/2020   Procedure: CATARACT EXTRACTION PHACO AND INTRAOCULAR LENS PLACEMENT ([facility]) RIGHT 2.33  00:24.3;  Surgeon: Eulogio Bear, MD;  Location: [place];  Service: Ophthalmology;  Laterality: Right;   CATARACT EXTRACTION W/PHACO Left 04/02/2020   Procedure: CATARACT EXTRACTION PHACO AND INTRAOCULAR LENS PLACEMENT ([place]) LEFT;  Surgeon: Eulogio Bear, MD;  Location: [place];  Service: Ophthalmology;  Laterality:  Left;  1.68 0:28.8   CHOLECYSTECTOMY  2011   COLONOSCOPY WITH PROPOFOL N/A 11/29/2018   Procedure: COLONOSCOPY WITH PROPOFOL;  Surgeon: Virgel Manifold, MD;  Location: [facility];  Service: Endoscopy;  Laterality: N/A;   OOPHORECTOMY      Family History  Problem Relation Age of Onset   Breast cancer Mother 22   Breast cancer Maternal Aunt 66   Lung cancer Sister    Diabetes Sister    Prostate cancer Brother    Heart disease Brother    Stroke Cousin    Hypertension Cousin     Social History Social History   Tobacco Use   Smoking status: Never   Smokeless tobacco: Never  Vaping Use   Vaping Use: Never used  Substance Use Topics   Alcohol use: Yes    Comment: occasional - 1-2 drinks/year   Drug use: Never    No Known Allergies  Current Outpatient Medications  Medication Sig Dispense Refill   alendronate (FOSAMAX) 70 MG tablet Take 1 tablet (70 mg total) by mouth once a week. Take with a full glass of water on an empty stomach. 13 tablet 3   aspirin EC 81 MG tablet Take 81 mg by mouth daily.     atorvastatin (LIPITOR) 10 MG tablet TAKE 1 TABLET BY MOUTH  DAILY 90 tablet 3   Calcium Carbonate-Vit D-Min (CALCIUM 1200 PO) Take by mouth in the morning and at bedtime.     Cholecalciferol (VITAMIN D3) 2000  units TABS Take by mouth.     Multiple Vitamin (MULTIVITAMIN ADULT PO) Take by mouth.     No current facility-administered medications for this visit.     Review of Systems Full ROS  was asked and was negative except for the information on the HPI  Physical Exam Blood pressure (!) 141/82, pulse 67, temperature 98.4 F (36.9 C), temperature source Oral, height 5\' 5"  (1.651 m), weight 158 lb 6.4 oz (71.8 kg), SpO2 100 %. CONSTITUTIONAL: NAD. EYES: Pupils are equal, round,  Sclera are non-icteric. EARS, NOSE, MOUTH AND THROAT: She is wearing a mask.The oral mucosa is pink and moist. Hearing is intact to voice. LYMPH NODES:  Lymph nodes in the neck are  normal. RESPIRATORY:  Lungs are clear. There is normal respiratory effort, with equal breath sounds bilaterally, and without pathologic use of accessory muscles. CARDIOVASCULAR: Heart is regular without murmurs, gallops, or rubs. GI: The abdomen is  soft, nontender, and nondistended. There are no palpable masses. There is no hepatosplenomegaly. There are normal bowel sounds in all quadrants.  Rectal: There is skin tags on left and right lateral areas.  Exam is uncomfortable.  There are no evidence of any palpable masses.  There is no blood Anoscopy: Anoscope was placed transrectally.  Patient has some discomfort but there was no evidence of any obvious lesions.  Limited due to discomfort   MUSCULOSKELETAL: Normal muscle strength and tone. No cyanosis or edema.   SKIN: Turgor is good and there are no pathologic skin lesions or ulcers. NEUROLOGIC: Motor and sensation is grossly normal. Cranial nerves are grossly intact. PSYCH:  Oriented to person, place and time. Affect is normal.  Data Reviewed  I have personally reviewed the patient's imaging, laboratory findings and medical records.    Assessment/Plan 71 year old female with rectal polyp.  On physical exam I am not concerned about malignancy.  Discussed with the patient in detail.  Given limitations on bedside exam I definitely recommend exam under anesthesia and excision of polypoid lesion within the rectum.  I do think that we can do this transrectally.  Procedure discussed with the patient in detail.  Risks, benefits and possible implications including but not limited to: Bleeding, infection, chronic pain and upgrading of pathology.  She understands and wished to proceed.   Please note that I spent greater than 60 minutes in this encounter , including personally reviewing imaging studies, records, placing orders, counseling the patient and performing appropriate documentation.    Caroleen Hamman, MD FACS General Surgeon 11/18/2021, 10:56 AM

## 2021-11-18 NOTE — Telephone Encounter (Signed)
Patient has been advised of Pre-Admission date/time, COVID Testing date and Surgery date.  Surgery Date: 11/28/21 Preadmission Testing Date: 11/21/21 (phone 8am-1:00 pm) Covid Testing Date: Not needed.   Patient has been made aware to call [phone], between 1-3:00pm the day before surgery, to find out what time to arrive for surgery.

## 2021-11-21 ENCOUNTER — Other Ambulatory Visit: Payer: Self-pay

## 2021-11-21 ENCOUNTER — Other Ambulatory Visit: Payer: Medicare Other

## 2021-11-21 ENCOUNTER — Other Ambulatory Visit
Admission: RE | Admit: 2021-11-21 | Discharge: 2021-11-21 | Disposition: A | Discharge status: Home / Self Care | Payer: Medicare Other | Source: Ambulatory Visit | Attending: Surgery | Admitting: Surgery

## 2021-11-21 NOTE — Patient Instructions (Signed)
Your procedure is scheduled on: Thursday November 28, 2021. Report to Day Surgery inside [facility] 2nd floor.  To find out your arrival time please call [phone] between 1PM - 3PM on Wednesday November 27, 2021.  Remember: Instructions that are not followed completely may result in serious medical risk,  up to and including death, or upon the discretion of your surgeon and anesthesiologist your  surgery may need to be rescheduled.     _X__ 1. Do not eat food after midnight the night before your procedure.                 No chewing gum or hard candies. You may drink clear liquids up to 2 hours                 before you are scheduled to arrive for your surgery- DO not drink clear                 liquids within 2 hours of the start of your surgery.                 Clear Liquids include:  water, apple juice without pulp, clear Gatorade, G2 or                  Gatorade Zero (avoid Red/Purple/Blue), Black Coffee or Tea (Do not add                 anything to coffee or tea).  __X__2.  On the morning of surgery brush your teeth with toothpaste and water, you                may rinse your mouth with mouthwash if you wish.  Do not swallow any toothpaste or mouthwash.     _X__ 3.  No Alcohol for 24 hours before or after surgery.   _X__ 4.  Do Not Smoke or use e-cigarettes For 24 Hours Prior to Your Surgery.                 Do not use any chewable tobacco products for at least 6 hours prior to                 Surgery.  _X__  5.  Do not use any recreational drugs (marijuana, cocaine, heroin, ecstasy, MDMA or other)                For at least one week prior to your surgery.  Combination of these drugs with anesthesia                May have life threatening results.  ____  6.  Bring all medications with you on the day of surgery if instructed.   __X__  7.  Notify your doctor if there is any change in your medical condition      (cold, fever, infections).     Do  not wear jewelry, make-up, hairpins, clips or nail polish. Do not wear lotions, powders, or perfumes. You may wear deodorant. Do not shave 48 hours prior to surgery.  Do not bring valuables to the hospital.    [facility] is not responsible for any belongings or valuables.  Contacts, dentures or bridgework may not be worn into surgery. Leave your suitcase in the car. After surgery it may be brought to your room. For patients admitted to the hospital, discharge time is determined by your treatment team.   Patients discharged the day of surgery  will not be allowed to drive home.   Make arrangements for someone to be with you for the first 24 hours of your Same Day Discharge.    __X__ Take these medicines the morning of surgery with A SIP OF WATER:    1. None    2.   3.   4.  5.  6.  ____ Fleet Enema (as directed)   __X__ Use CHG Soap (or wipes) as directed  ____ Use Benzoyl Peroxide Gel as instructed  ____ Use inhalers on the day of surgery  ____ Stop metformin 2 days prior to surgery    ____ Take 1/2 of usual insulin dose the night before surgery. No insulin the morning          of surgery.   __X__ One week prior to surgery stop aspirin.  __X__ One Week prior to surgery- Stop Anti-inflammatories such as Ibuprofen, Aleve, Advil, Motrin, meloxicam (MOBIC), diclofenac, etodolac, ketorolac, Toradol, Daypro, piroxicam, Goody's or BC powders. OK TO USE TYLENOL IF NEEDED   __X__ Stop supplements until after surgery.    ____ Bring C-Pap to the hospital.    If you have any questions regarding your pre-procedure instructions,  Please call Pre-admit Testing at [phone]

## 2021-11-22 ENCOUNTER — Encounter
Admission: RE | Admit: 2021-11-22 | Discharge: 2021-11-22 | Disposition: A | Discharge status: Home / Self Care | Payer: Medicare Other | Source: Ambulatory Visit | Attending: Surgery | Admitting: Surgery

## 2021-11-22 DIAGNOSIS — Z01810 Encounter for preprocedural cardiovascular examination: Secondary | ICD-10-CM | POA: Diagnosis not present

## 2021-11-25 ENCOUNTER — Ambulatory Visit: Payer: Medicare Other | Admitting: Internal Medicine

## 2021-11-27 MED ORDER — CHLORHEXIDINE GLUCONATE 0.12 % MT SOLN: 15.0000 mL | Freq: Once | OROMUCOSAL | Status: AC

## 2021-11-27 MED ORDER — CHLORHEXIDINE GLUCONATE CLOTH 2 % EX PADS: 6.0000 | MEDICATED_PAD | Freq: Once | CUTANEOUS | Status: DC

## 2021-11-27 MED ORDER — LACTATED RINGERS IV SOLN: INTRAVENOUS | Status: DC

## 2021-11-27 MED ORDER — FAMOTIDINE 20 MG PO TABS: 20.0000 mg | ORAL_TABLET | Freq: Once | ORAL | Status: AC

## 2021-11-27 MED ORDER — ACETAMINOPHEN 500 MG PO TABS: 1000.0000 mg | ORAL_TABLET | ORAL | Status: AC

## 2021-11-27 MED ORDER — CELECOXIB 200 MG PO CAPS: 200.0000 mg | ORAL_CAPSULE | ORAL | Status: AC

## 2021-11-27 MED ORDER — ORAL CARE MOUTH RINSE: 15.0000 mL | Freq: Once | OROMUCOSAL | Status: AC

## 2021-11-27 MED ORDER — GABAPENTIN 300 MG PO CAPS: 300.0000 mg | ORAL_CAPSULE | ORAL | Status: AC

## 2021-11-28 ENCOUNTER — Encounter
Admission: RE | Disposition: A | Discharge status: Home / Self Care | Payer: Self-pay | Source: Ambulatory Visit | Attending: Surgery

## 2021-11-28 ENCOUNTER — Other Ambulatory Visit: Payer: Self-pay

## 2021-11-28 ENCOUNTER — Ambulatory Visit
Admission: RE | Admit: 2021-11-28 | Discharge: 2021-11-28 | Disposition: A | Discharge status: Home / Self Care | Payer: Medicare Other | Source: Ambulatory Visit | Attending: Surgery | Admitting: Surgery

## 2021-11-28 ENCOUNTER — Ambulatory Visit: Payer: Medicare Other | Admitting: Certified Registered"

## 2021-11-28 ENCOUNTER — Encounter: Payer: Self-pay | Admitting: Surgery

## 2021-11-28 DIAGNOSIS — K620 Anal polyp: Secondary | ICD-10-CM | POA: Diagnosis not present

## 2021-11-28 DIAGNOSIS — K621 Rectal polyp: Secondary | ICD-10-CM | POA: Diagnosis not present

## 2021-11-28 HISTORY — PX: TUMOR EXCISION: SHX421

## 2021-11-28 SURGERY — EXAM UNDER ANESTHESIA
Anesthesia: General

## 2021-11-28 MED ORDER — LIDOCAINE HCL (CARDIAC) PF 100 MG/5ML IV SOSY
PREFILLED_SYRINGE | INTRAVENOUS | Status: DC | PRN
  Administered 2021-11-28: 100 mg via INTRAVENOUS

## 2021-11-28 MED ORDER — ONDANSETRON HCL 4 MG/2ML IJ SOLN
INTRAMUSCULAR | Status: AC
  Filled 2021-11-28: qty 2

## 2021-11-28 MED ORDER — EPHEDRINE SULFATE (PRESSORS) 50 MG/ML IJ SOLN
INTRAMUSCULAR | Status: DC | PRN
  Administered 2021-11-28: 10 mg via INTRAVENOUS

## 2021-11-28 MED ORDER — OXYCODONE HCL 5 MG PO TABS: 5.0000 mg | ORAL_TABLET | Freq: Once | ORAL | Status: DC | PRN

## 2021-11-28 MED ORDER — LACTATED RINGERS IV SOLN: INTRAVENOUS | Status: DC

## 2021-11-28 MED ORDER — SEVOFLURANE IN SOLN
RESPIRATORY_TRACT | Status: AC
  Filled 2021-11-28: qty 250

## 2021-11-28 MED ORDER — HYDROCODONE-ACETAMINOPHEN 5-325 MG PO TABS: 1.0000 | ORAL_TABLET | Freq: Four times a day (QID) | ORAL | 0 refills | Status: DC | PRN

## 2021-11-28 MED ORDER — GABAPENTIN 300 MG PO CAPS
ORAL_CAPSULE | ORAL | Status: AC
  Administered 2021-11-28: 300 mg via ORAL
  Filled 2021-11-28: qty 1

## 2021-11-28 MED ORDER — BUPIVACAINE LIPOSOME 1.3 % IJ SUSP
INTRAMUSCULAR | Status: DC | PRN
  Administered 2021-11-28: 20 mL

## 2021-11-28 MED ORDER — CHLORHEXIDINE GLUCONATE 0.12 % MT SOLN
OROMUCOSAL | Status: AC
  Administered 2021-11-28: 15 mL via OROMUCOSAL
  Filled 2021-11-28: qty 15

## 2021-11-28 MED ORDER — FENTANYL CITRATE (PF) 100 MCG/2ML IJ SOLN: 25.0000 ug | INTRAMUSCULAR | Status: DC | PRN

## 2021-11-28 MED ORDER — DEXMEDETOMIDINE HCL IN NACL 200 MCG/50ML IV SOLN
INTRAVENOUS | Status: DC | PRN
Start: 2021-11-28 — End: 2021-11-28
  Administered 2021-11-28: 8 ug via INTRAVENOUS
  Administered 2021-11-28: 4 ug via INTRAVENOUS
  Administered 2021-11-28: 8 ug via INTRAVENOUS

## 2021-11-28 MED ORDER — DEXAMETHASONE SODIUM PHOSPHATE 10 MG/ML IJ SOLN
INTRAMUSCULAR | Status: AC
  Filled 2021-11-28: qty 1

## 2021-11-28 MED ORDER — DEXMEDETOMIDINE (PRECEDEX) IN NS 20 MCG/5ML (4 MCG/ML) IV SYRINGE
PREFILLED_SYRINGE | INTRAVENOUS | Status: AC
  Filled 2021-11-28: qty 5

## 2021-11-28 MED ORDER — ACETAMINOPHEN 500 MG PO TABS
ORAL_TABLET | ORAL | Status: AC
  Administered 2021-11-28: 1000 mg via ORAL
  Filled 2021-11-28: qty 2

## 2021-11-28 MED ORDER — ONDANSETRON HCL 4 MG/2ML IJ SOLN: 4.0000 mg | Freq: Once | INTRAMUSCULAR | Status: DC | PRN

## 2021-11-28 MED ORDER — FAMOTIDINE 20 MG PO TABS
ORAL_TABLET | ORAL | Status: AC
  Administered 2021-11-28: 20 mg via ORAL
  Filled 2021-11-28: qty 1

## 2021-11-28 MED ORDER — BUPIVACAINE LIPOSOME 1.3 % IJ SUSP
INTRAMUSCULAR | Status: AC
  Filled 2021-11-28: qty 20

## 2021-11-28 MED ORDER — CELECOXIB 200 MG PO CAPS
ORAL_CAPSULE | ORAL | Status: AC
  Administered 2021-11-28: 200 mg via ORAL
  Filled 2021-11-28: qty 1

## 2021-11-28 MED ORDER — PROPOFOL 500 MG/50ML IV EMUL
INTRAVENOUS | Status: AC
  Filled 2021-11-28: qty 50

## 2021-11-28 MED ORDER — ACETAMINOPHEN 10 MG/ML IV SOLN: 1000.0000 mg | Freq: Once | INTRAVENOUS | Status: DC | PRN

## 2021-11-28 MED ORDER — PROPOFOL 10 MG/ML IV BOLUS
INTRAVENOUS | Status: DC | PRN
Start: 2021-11-28 — End: 2021-11-28
  Administered 2021-11-28: 50 mg via INTRAVENOUS
  Administered 2021-11-28: 150 mg via INTRAVENOUS

## 2021-11-28 MED ORDER — LIDOCAINE HCL (PF) 2 % IJ SOLN
INTRAMUSCULAR | Status: AC
  Filled 2021-11-28: qty 5

## 2021-11-28 MED ORDER — 0.9 % SODIUM CHLORIDE (POUR BTL) OPTIME
TOPICAL | Status: DC | PRN
  Administered 2021-11-28: 50 mL

## 2021-11-28 MED ORDER — OXYCODONE HCL 5 MG/5ML PO SOLN: 5.0000 mg | Freq: Once | ORAL | Status: DC | PRN

## 2021-11-28 SURGICAL SUPPLY — 32 items
BLADE CLIPPER SURG (BLADE) ×2 IMPLANT
BLADE SURG 15 STRL LF DISP TIS (BLADE) ×1 IMPLANT
BLADE SURG 15 STRL SS (BLADE) ×2
BRIEF STRETCH FOR OB PAD XXL (UNDERPADS AND DIAPERS) ×2 IMPLANT
DRAPE 3/4 80X56 (DRAPES) ×1 IMPLANT
DRAPE LAPAROTOMY 77X122 PED (DRAPES) ×2 IMPLANT
DRAPE LEGGINS SURG 28X43 STRL (DRAPES) ×2 IMPLANT
DRAPE UNDER BUTTOCK W/FLU (DRAPES) ×2 IMPLANT
DRSG GAUZE FLUFF 36X18 (GAUZE/BANDAGES/DRESSINGS) ×2 IMPLANT
ELECT CAUTERY BLADE 6.4 (BLADE) ×2 IMPLANT
ELECT REM PT RETURN 9FT ADLT (ELECTROSURGICAL) ×2
ELECTRODE REM PT RTRN 9FT ADLT (ELECTROSURGICAL) ×1 IMPLANT
GAUZE 4X4 16PLY ~~LOC~~+RFID DBL (SPONGE) ×2 IMPLANT
GLOVE SURG ENC MOIS LTX SZ7 (GLOVE) ×2 IMPLANT
GOWN STRL REUS W/ TWL LRG LVL3 (GOWN DISPOSABLE) ×2 IMPLANT
GOWN STRL REUS W/TWL LRG LVL3 (GOWN DISPOSABLE) ×4
MANIFOLD NEPTUNE II (INSTRUMENTS) ×2 IMPLANT
NEEDLE HYPO 22GX1.5 SAFETY (NEEDLE) ×2 IMPLANT
NS IRRIG 1000ML POUR BTL (IV SOLUTION) ×2 IMPLANT
NS IRRIG 500ML POUR BTL (IV SOLUTION) ×2 IMPLANT
PACK BASIN MINOR ARMC (MISCELLANEOUS) ×2 IMPLANT
PAD ABD DERMACEA PRESS 5X9 (GAUZE/BANDAGES/DRESSINGS) ×2 IMPLANT
PAD PREP 24X41 OB/GYN DISP (PERSONAL CARE ITEMS) ×2 IMPLANT
PANTS MESH DISP 2XL (UNDERPADS AND DIAPERS) ×1 IMPLANT
PANTS MESH DISPOSABLE 2XL (UNDERPADS AND DIAPERS) ×1
SHEARS HARMONIC 9CM CVD (BLADE) ×2 IMPLANT
SOL PREP PVP 2OZ (MISCELLANEOUS) ×2
SOLUTION PREP PVP 2OZ (MISCELLANEOUS) ×1 IMPLANT
SPONGE T-LAP 18X18 ~~LOC~~+RFID (SPONGE) ×2 IMPLANT
SURGILUBE 2OZ TUBE FLIPTOP (MISCELLANEOUS) ×2 IMPLANT
SYR 20ML LL LF (SYRINGE) ×2 IMPLANT
WATER STERILE IRR 500ML POUR (IV SOLUTION) ×1 IMPLANT

## 2021-11-28 NOTE — Interval H&P Note (Signed)
History and Physical Interval Note:  11/28/2021 9:01 AM  Brittany Farley  has presented today for surgery, with the diagnosis of anal polyp.  The various methods of treatment have been discussed with the patient and family. After consideration of risks, benefits and other options for treatment, the patient has consented to  Procedure(s): EXAM UNDER ANESTHESIA (N/A) TRANSRECTAL TUMOR EXCISION, polyp (N/A) as a surgical intervention.  The patient's history has been reviewed, patient examined, no change in status, stable for surgery.  I have reviewed the patient's chart and labs.  Questions were answered to the patient's satisfaction.     [place]

## 2021-11-28 NOTE — Anesthesia Postprocedure Evaluation (Signed)
Anesthesia Post Note  Patient: Brittany Farley  Procedure(s) Performed: EXAM UNDER ANESTHESIA TRANSRECTAL TUMOR EXCISION, polyp  Patient location during evaluation: PACU Anesthesia Type: General Level of consciousness: awake and alert, oriented and patient cooperative Pain management: pain level controlled Vital Signs Assessment: post-procedure vital signs reviewed and stable Respiratory status: spontaneous breathing, nonlabored ventilation and respiratory function stable Cardiovascular status: blood pressure returned to baseline and stable Postop Assessment: adequate PO intake Anesthetic complications: no   No notable events documented.   Last Vitals:  Vitals:   11/28/21 1041 11/28/21 1054  BP: 120/72 (!) 143/78  Pulse: 64 (!) 58  Resp: 18 16  Temp: (!) 36.1 C (!) 36.1 C  SpO2: 100% 98%    Last Pain:  Vitals:   11/28/21 1054  TempSrc: Temporal  PainSc: 0-No pain                 Darrin Nipper

## 2021-11-28 NOTE — Discharge Instructions (Addendum)
Anorectal surgery, Care After This sheet gives you information about how to care for yourself after your procedure. Your health care provider may also give you more specific instructions. If you have problems or questions, contact your health care provider. What can I expect after the procedure? After the procedure, it is common to have: Rectal pain, especially during a bowel movement. Some tenderness and swelling. A small amount of fluid or blood coming from the rectal area. Follow these instructions at home: Medicines Take over-the-counter and prescription medicines only as told by your health care provider. These include laxatives or stool softeners. If you were prescribed an antibiotic medicine, take it as told by your health care provider. Do not stop taking the antibiotic even if you start to feel better. Ask your health care provider if the medicine prescribed to you: Requires you to avoid driving or using heavy machinery. Can cause constipation. You may need to take these actions to prevent or treat constipation: Drink enough fluid to keep your urine pale yellow. Take over-the-counter or prescription medicines. Eat foods that are high in fiber, such as beans, whole grains, and fresh fruits and vegetables. Limit foods that are high in fat and processed sugars, such as fried or sweet foods. Incision care Follow instructions from your health care provider about how to take care of your incision. Make sure you: Take sitz baths as told by your health care provider. A sitz bath is a warm water bath that is taken while you are sitting down. The water should come up to your hips and should cover your buttocks. You may be instructed to take several sitz baths a day. Wear an absorptive pad in your underwear to help absorb any drainage. Change this pad as needed. Watch your rectal area every day for signs of infection. Watch for: More pain. More tenderness or swelling. Redness around your  rectal area. Pus. Driving Do not drive for 24 hours if you were given a medicine to help you relax (sedative) during your procedure. Ask your health care provider when it is safe for you to drive. Activity Return to your normal activities as told by your health care provider. Ask your health care provider what activities are safe for you.  General instructions Do not use any products that contain nicotine or tobacco, such as cigarettes, e-cigarettes, and chewing tobacco. These can delay incision healing after surgery. If you need help quitting, ask your health care provider. Eat foods that are high in fiber, such as fresh fruits and vegetables, whole grains, and beans. Do not strain to have a bowel movement. Keep all follow-up visits as told by your health care provider. This is important. Contact a health care provider if: You have a fever. You have pain that does not get better with medicine. You become constipated. You have more swelling or pain around your rectal area. You have redness around your rectal area. You have pus coming from your rectal area. You have more fluid or blood coming from your rectal area. Get help right away if: You have severe pain. You have bleeding from your rectal area that does not stop. You are unable to urinate. Summary After your procedure, it is common to have pain in the rectum. You may also notice blood and fluid in the area. Follow instructions from your health care provider. You may be asked to take a sitz bath several times a day. This is important. Watch your rectal area for signs of infection. Signs include  pus, more pain, or more swelling. Wear a pad in your underwear to absorb any drainage. Change this pad as needed. Take all medicines as told. You may need to take steps to prevent constipation. Follow your health care provider's instructions. This information is not intended to replace advice given to you by your health care provider. Make  sure you discuss any questions you have with your health care provider. Document Revised: 10/16/2020 Document Reviewed: 10/08/2019 Elsevier Patient Education  2022 [place] for Discharge Teaching: EXPAREL (bupivacaine liposome injectable suspension)   Your surgeon or anesthesiologist gave you EXPAREL(bupivacaine) to help control your pain after surgery.  EXPAREL is a local anesthetic that provides pain relief by numbing the tissue around the surgical site. EXPAREL is designed to release pain medication over time and can control pain for up to 72 hours. Depending on how you respond to EXPAREL, you may require less pain medication during your recovery.  Possible side effects: Temporary loss of sensation or ability to move in the area where bupivacaine was injected. Nausea, vomiting, constipation Rarely, numbness and tingling in your mouth or lips, lightheadedness, or anxiety may occur. Call your doctor right away if you think you may be experiencing any of these sensations, or if you have other questions regarding possible side effects.  Follow all other discharge instructions given to you by your surgeon or nurse. Eat a healthy diet and drink plenty of water or other fluids.  If you return to the hospital for any reason within 96 hours following the administration of EXPAREL, it is important for health care providers to know that you have received this anesthetic. A teal colored band has been placed on your arm with the date, time and amount of EXPAREL you have received in order to alert and inform your health care providers. Please leave this armband in place for the full 96 hours following administration, and then you may remove the band.   AMBULATORY SURGERY  DISCHARGE INSTRUCTIONS   The drugs that you were given will stay in your system until tomorrow so for the next 24 hours you should not:  Drive an automobile Make any legal decisions Drink any alcoholic  beverage   You may resume regular meals tomorrow.  Today it is better to start with liquids and gradually work up to solid foods.  You may eat anything you prefer, but it is better to start with liquids, then soup and crackers, and gradually work up to solid foods.   Please notify your doctor immediately if you have any unusual bleeding, trouble breathing, redness and pain at the surgery site, drainage, fever, or pain not relieved by medication.    Additional Instructions:        Please contact your physician with any problems or Same Day Surgery at [phone], Monday through Friday 6 am to 4 pm, or [place] [facility] number at [phone].

## 2021-11-28 NOTE — Transfer of Care (Signed)
Immediate Anesthesia Transfer of Care Note  Patient: Brittany Farley  Procedure(s) Performed: EXAM UNDER ANESTHESIA TRANSRECTAL TUMOR EXCISION, polyp  Patient Location: PACU  Anesthesia Type:General  Level of Consciousness: awake  Airway & Oxygen Therapy: Patient Spontanous Breathing  Post-op Assessment: Report given to RN  Post vital signs: stable  Last Vitals:  Vitals Value Taken Time  BP    Temp    Pulse    Resp    SpO2      Last Pain:  Vitals:   11/28/21 0751  TempSrc: Temporal  PainSc: 0-No pain         Complications: No notable events documented.

## 2021-11-28 NOTE — Anesthesia Procedure Notes (Signed)
Procedure Name: Intubation Date/Time: 11/28/2021 9:48 AM Performed by: Kerri Perches, CRNA Pre-anesthesia Checklist: Patient identified, Patient being monitored, Timeout performed, Emergency Drugs available and Suction available Patient Re-evaluated:Patient Re-evaluated prior to induction Oxygen Delivery Method: Circle system utilized Preoxygenation: Pre-oxygenation with 100% oxygen Induction Type: IV induction Ventilation: Mask ventilation without difficulty Laryngoscope Size: Mac and 3 Grade View: Grade I Tube type: Oral Tube size: 7.0 mm Number of attempts: 1 Airway Equipment and Method: Stylet and Bougie stylet Placement Confirmation: ETT inserted through vocal cords under direct vision, positive ETCO2 and breath sounds checked- equal and bilateral Secured at: 21 cm Tube secured with: Tape Dental Injury: Teeth and Oropharynx as per pre-operative assessment

## 2021-11-28 NOTE — Anesthesia Preprocedure Evaluation (Addendum)
Anesthesia Evaluation  Patient identified by MRN, date of birth, ID band Patient awake    Reviewed: Allergy & Precautions, NPO status , Patient's Chart, lab work & pertinent test results  History of Anesthesia Complications Negative for: history of anesthetic complications  Airway Mallampati: IV   Neck ROM: Full    Dental   Missing few molars:   Pulmonary neg pulmonary ROS,    Pulmonary exam normal breath sounds clear to auscultation       Cardiovascular Exercise Tolerance: Good negative cardio ROS Normal cardiovascular exam Rhythm:Regular Rate:Normal  ECG 11/22/21:  Normal sinus rhythm Low voltage QRS   Neuro/Psych negative neurological ROS     GI/Hepatic negative GI ROS,   Endo/Other  negative endocrine ROS  Renal/GU negative Renal ROS     Musculoskeletal  (+) Arthritis , Rectal polyp   Abdominal   Peds  Hematology negative hematology ROS (+)   Anesthesia Other Findings   Reproductive/Obstetrics                            Anesthesia Physical Anesthesia Plan  ASA: 2  Anesthesia Plan: General   Post-op Pain Management:    Induction: Intravenous  PONV Risk Score and Plan: 3 and Propofol infusion, TIVA, Treatment may vary due to age or medical condition and Ondansetron  Airway Management Planned: Natural Airway  Additional Equipment:   Intra-op Plan:   Post-operative Plan:   Informed Consent: I have reviewed the patients History and Physical, chart, labs and discussed the procedure including the risks, benefits and alternatives for the proposed anesthesia with the patient or authorized representative who has indicated his/her understanding and acceptance.       Plan Discussed with: CRNA  Anesthesia Plan Comments: (LMA/GETA backup discussed.  Patient consented for risks of anesthesia including but not limited to:  - adverse reactions to medications - damage to eyes,  teeth, lips or other oral mucosa - nerve damage due to positioning  - sore throat or hoarseness - damage to heart, brain, nerves, lungs, other parts of body or loss of life  Informed patient about role of CRNA in peri- and intra-operative care.  Patient voiced understanding.)        Anesthesia Quick Evaluation

## 2021-11-28 NOTE — Op Note (Signed)
°  PRE-OPERATIVE DIAGNOSIS:  Rectal polyp  POST-OPERATIVE DIAGNOSIS: Same  PROCEDURE:   1. Anorectal Exam under Anesthesia 2. Transanal excision of rectal polyp  SURGEON:  Surgeon(s) and Role:    * Malaquias Lenker F, MD - Primary  FINDINGS: posterolateral right 5 mm rectal polyp  EBL: minimal  ANESTHESIA: General    DICTATION:  Patient was explained about the  procedure in detail. Risks, benefits and possible complications ( including but not limited to recurrence, transient incontinence, pain, bleeding)  and a consent was obtained. The patient taken to the operating room and placed in the lithotomy position.   Exam under anesthesia using the anal speculum revealed a right  left posterolateral 5 mm polyp. .  I used an Allis clamp to elevate the polyp and excised it with the Harmonic scalpel. Excellent hemostasis observed. Liposomal Marcaine  was injected around the perianal site. Needle and laparotomy counts were correct and there were no immediate complications  Jules Husbands, MD, FACS

## 2021-11-29 LAB — SURGICAL PATHOLOGY

## 2021-12-06 ENCOUNTER — Other Ambulatory Visit: Payer: Self-pay

## 2021-12-06 ENCOUNTER — Encounter: Payer: Self-pay | Admitting: Internal Medicine

## 2021-12-06 ENCOUNTER — Telehealth: Payer: Self-pay | Admitting: Family

## 2021-12-06 ENCOUNTER — Ambulatory Visit: Payer: Medicare Other | Admitting: Internal Medicine

## 2021-12-06 VITALS — BP 138/82 | HR 74 | Ht 65.0 in | Wt 157.8 lb

## 2021-12-06 DIAGNOSIS — K760 Fatty (change of) liver, not elsewhere classified: Secondary | ICD-10-CM

## 2021-12-06 DIAGNOSIS — R748 Abnormal levels of other serum enzymes: Secondary | ICD-10-CM

## 2021-12-06 DIAGNOSIS — M25551 Pain in right hip: Secondary | ICD-10-CM | POA: Diagnosis not present

## 2021-12-06 DIAGNOSIS — M810 Age-related osteoporosis without current pathological fracture: Secondary | ICD-10-CM

## 2021-12-06 NOTE — Patient Instructions (Addendum)
°-   Alendronate ( Fosamax) 70 mg , 1 tablet weekly - Continue Calcium and Vitamin D daily     Please contact the [facility] [place]: [address], [place],  [address]  Phone: [phone]   to schedule bone density by 04/2022

## 2021-12-06 NOTE — Progress Notes (Signed)
Name: Brittany Farley  MRN/ DOB: [id number], [birth year]    Age/ Sex: 71 y.o., female     PCP: Allegra Grana, FNP   Reason for Endocrinology Evaluation: Osteoporosis/elevated Alk. Phos      Initial Endocrinology Clinic Visit: 06/11/2020    PATIENT IDENTIFIER: Brittany Farley is a 71 y.o., female with a past medical history of low bone density and dyslipidemia. She has followed with [facility] since 06/11/2020 for consultative assistance with management of her Elevated Alk. Phos and osteoporosis  HISTORICAL SUMMARY:  Pt has been noted to have elevated Alkaline phosphatase since 2018 with fluctuating readings.  Alk. Phos isozymes showed 58% of bone origin and 42 % liver origin.    Pt was diagnosed with osteoporosis:2019   Menarche at age : 1 Menopausal at age : Hysterectomy less than 30 yrs Fracture Hx: no Hx of HRT: no FH of osteoporosis or hip fracture: Mother hip fracture     Started Alendronate 06/2020  SUBJECTIVE:    Today (12/06/2021):  [name] is here for a follow up on osteoporosis and elevated Alk. Phos.   She is tolerating Alendronate without side effects  Weight has been stable  Denies constipation or diarrhea  Denies heartburn  Has occasional constipation, had recent sx for a rectal tear   Has noted decrease range of motion of the right hip joint ,she is a aerobic class teacher, historically she did not recall any pain at the right hip but today while moving her hip during the office visit she has been noted with pain.    HOME ENDOCRINE MEDICATIONS Alendronate 70 mg weekly  Calcium 1200 mg daily  Vitamin D 2000 iu daily      HISTORY:  Past Medical History:  Past Medical History:  Diagnosis Date   Arthritis    right hand   Osteoporosis    Past Surgical History:  Past Surgical History:  Procedure Laterality Date   ABDOMINAL HYSTERECTOMY  1980   had hysterectomy for fibroids. No cancer. Has both ovaries.    CATARACT  EXTRACTION W/PHACO Right 02/20/2020   Procedure: CATARACT EXTRACTION PHACO AND INTRAOCULAR LENS PLACEMENT (IOC) RIGHT 2.33  00:24.3;  Surgeon: Nevada Crane, MD;  Location[facility];  Service: Ophthalmology;  Laterality: Right;   CATARACT EXTRACTION W/PHACO Left 04/02/2020   Procedure: CATARACT EXTRACTION PHACO AND INTRAOCULAR LENS PLACEMENT ([facility]) LEFT;  Surgeon: Nevada Crane, MD;  Location: [facility];  Service: Ophthalmology;  Laterality: Left;  1.68 0:28.8   CHOLECYSTECTOMY  2011   COLONOSCOPY WITH PROPOFOL N/A 11/29/2018   Procedure: COLONOSCOPY WITH PROPOFOL;  Surgeon: Pasty Spillers, MD;  Location: [facility];  Service: Endoscopy;  Laterality: N/A;   OOPHORECTOMY     TUMOR EXCISION N/A 11/28/2021   Procedure: TRANSRECTAL TUMOR EXCISION, polyp;  Surgeon: Leafy Ro, MD;  Location: [facility];  Service: General;  Laterality: N/A;   Social History:  reports that she has never smoked. She has never used smokeless tobacco. She reports that she does not currently use alcohol. She reports that she does not use drugs. Family History:  Family History  Problem Relation Age of Onset   Breast cancer Mother 37   Breast cancer Maternal Aunt 27   Lung cancer Sister    Diabetes Sister    Prostate cancer Brother    Heart disease Brother    Stroke Cousin    Hypertension Cousin      HOME MEDICATIONS: Allergies as of 12/06/2021  No Known Allergies      Medication List        Accurate as of December 06, 2021  9:19 AM. If you have any questions, ask your nurse or doctor.          STOP taking these medications    HYDROcodone-acetaminophen 5-325 MG tablet Commonly known as: NORCO/VICODIN Stopped by: Dorita Sciara, MD       TAKE these medications    alendronate 70 MG tablet Commonly known as: FOSAMAX Take 1 tablet (70 mg total) by mouth once a week. Take with a full glass of water on an empty stomach.   aspirin EC 81 MG tablet Take  81 mg by mouth daily.   atorvastatin 10 MG tablet Commonly known as: LIPITOR TAKE 1 TABLET BY MOUTH  DAILY   CALCIUM 1200 PO Take by mouth in the morning and at bedtime.   MULTIVITAMIN ADULT PO Take by mouth.   Vitamin D3 50 MCG (2000 UT) Tabs Take by mouth.          OBJECTIVE:   PHYSICAL EXAM: VS: BP 138/82 (BP Location: Left Arm, Patient Position: Sitting, Cuff Size: Small)    Pulse 74    Ht $R'5\' 5"'AX$  (1.651 m)    Wt 157 lb 12.8 oz (71.6 kg)    SpO2 96%    BMI 26.26 kg/m    EXAM: General: Pt appears well and is in NAD  Neck: General: Supple without adenopathy. Thyroid: Thyroid size normal.  No goiter or nodules appreciated. No thyroid bruit.  Lungs: Clear with good BS bilat with no rales, rhonchi, or wheezes  Heart: Auscultation: RRR.  Abdomen: Normoactive bowel sounds, soft, nontender, without masses or organomegaly palpable  Extremities:  BL LE: No pretibial edema normal No tenderness over both hip joints, normal range of motion on the left but the patient had limited internal rotation of the right hip  Mental Status: Judgment, insight: Intact Orientation: Oriented to time, place, and person Mood and affect: No depression, anxiety, or agitation     DATA REVIEWED:   Latest Reference Range & Units 10/31/21 14:46  Sodium 135 - 145 mEq/L 139  Potassium 3.5 - 5.1 mEq/L 4.4  Chloride 96 - 112 mEq/L 106  CO2 19 - 32 mEq/L 26  Glucose 70 - 99 mg/dL 100 (H)  BUN 6 - 23 mg/dL 19  Creatinine 0.40 - 1.20 mg/dL 0.79  Calcium 8.4 - 10.5 mg/dL 9.5  Alkaline Phosphatase 39 - 117 U/L 81  Albumin 3.5 - 5.2 g/dL 3.8  AST 0 - 37 U/L 37  ALT 0 - 35 U/L 25  Total Protein 6.0 - 8.3 g/dL 6.7  Total Bilirubin 0.2 - 1.2 mg/dL 0.4  GFR >60.00 mL/min 74.80  Total CHOL/HDL Ratio  2  Cholesterol 0 - 200 mg/dL 157  HDL Cholesterol >39.00 mg/dL 73.10  LDL (calc) 0 - 99 mg/dL 72  NonHDL  83.52  Triglycerides 0.0 - 149.0 mg/dL 59.0  VLDL 0.0 - 40.0 mg/dL 11.8    Latest Reference  Range & Units 10/31/21 14:46  TSH 0.35 - 5.50 uIU/mL 0.96      DXA 03/15/2020  DualFemur Neck Right 03/15/2020 70.6 Osteopenia -1.3 0.854 g/cm2 -3.5% - DualFemur Neck Right 10/13/2018 69.2 Osteopenia -1.1 0.885 g/cm2 -4.5% - DualFemur Neck Right 06/18/2017 67.9 Normal -0.8 0.927 g/cm2 - -   DualFemur Total Mean 03/15/2020 70.6 Osteopenia -1.6 0.803 g/cm2 -4.6% Yes DualFemur Total Mean 10/13/2018 69.2 Osteopenia -1.3 0.842 g/cm2 -4.4% Yes DualFemur Total  Mean 06/18/2017 67.9 Normal -1.0 0.881 g/cm2 - -   Left Forearm Radius 33% 03/15/2020 70.6 Osteoporosis -3.0 0.617 g/cm2 4.6% - Left Forearm Radius 33% 10/13/2018 69.2 Osteoporosis -3.3 0.590 g/cm2 -     Abdominal Ultrasound 11/05/2018   Mildly increased CBD diameter is likely a sequelae of prior cholecystectomy. There is no intrahepatic biliary ductal dilatation to suggest acute bile duct obstruction. 2. Evidence of hepatic steatosis  ASSESSMENT / PLAN / RECOMMENDATIONS:   Osteoporosis :     - Left forearm T-score -3.0 . Pt at high risk for fracture especially at the forearms - She is compliant with alendronate , calcium and vitamin D intake  -She is due for repeat DEXA, patient will contact the normal breast center to schedule this   Medications : Continue Alendronate 70 mg weekly  Continue Calcium 1200 mg daily  Continue Vitamin D 2000 iu daily    2. Elevated Alkaline Phosphatase:    -This has resolved - Large part of this Is due to increased bone resorption secondary to osteoporosis, and to some extent from hepatic steatosis      3. Hepatic steatosis :    -LFTs have resolved based on her most recent lab test from 10/31/2021 -AST has trended down from 44 to 37 U/L, ALT normal  4.  Right hip pain:  -This is beyond the scope of endocrinology and the patient will be referred to sports medicine specialist for further evaluation due to limited range of motion of the right hip    F/U in 1 yr      Signed electronically by: Mack Guise, MD  [facility]  [address] [place], [place] [address] Phone: [phone] FAX: [phone]      CC: Burnard Hawthorne, FNP [address] [place] [address] Phone: [phone]  Fax: [phone]   Return to Endocrinology clinic as below: Future Appointments  Date Time Provider [place]  12/13/2021 12:00 PM Burnard Hawthorne, FNP [facility]  12/16/2021  9:30 AM Jules Husbands, MD [facility] None  01/15/2022 10:10 AM Breccan Galant, Melanie Crazier, MD [facility] None  07/29/2022  9:00 AM Vidal Schwalbe Yvetta Coder, FNP [facility]

## 2021-12-06 NOTE — Telephone Encounter (Signed)
I called patient and made patient aware of appointment change, Reschedule appointment to 12/27/21 @ 3:30

## 2021-12-10 ENCOUNTER — Telehealth: Payer: Self-pay

## 2021-12-10 NOTE — Addendum Note (Signed)
Addended by: Dorita Sciara on: 12/10/2021 10:48 AM   Modules accepted: Orders

## 2021-12-10 NOTE — Telephone Encounter (Signed)
Patient would like referral sent to Ortho medicine instead of sport medicine in [place].

## 2021-12-11 ENCOUNTER — Ambulatory Visit
Admission: RE | Admit: 2021-12-11 | Discharge: 2021-12-11 | Disposition: A | Discharge status: Home / Self Care | Payer: Medicare Other | Source: Ambulatory Visit | Attending: Internal Medicine | Admitting: Internal Medicine

## 2021-12-11 ENCOUNTER — Other Ambulatory Visit: Payer: Self-pay

## 2021-12-11 DIAGNOSIS — Z780 Asymptomatic menopausal state: Secondary | ICD-10-CM | POA: Diagnosis not present

## 2021-12-11 DIAGNOSIS — M810 Age-related osteoporosis without current pathological fracture: Secondary | ICD-10-CM | POA: Diagnosis not present

## 2021-12-11 DIAGNOSIS — M85851 Other specified disorders of bone density and structure, right thigh: Secondary | ICD-10-CM | POA: Diagnosis not present

## 2021-12-12 ENCOUNTER — Encounter: Payer: Self-pay | Admitting: Internal Medicine

## 2021-12-13 ENCOUNTER — Ambulatory Visit: Payer: Medicare Other | Admitting: Family

## 2021-12-16 ENCOUNTER — Ambulatory Visit (INDEPENDENT_AMBULATORY_CARE_PROVIDER_SITE_OTHER): Payer: Medicare Other | Admitting: Surgery

## 2021-12-16 ENCOUNTER — Other Ambulatory Visit: Payer: Self-pay

## 2021-12-16 VITALS — BP 144/83 | HR 61 | Temp 98.5°F | Ht 65.0 in | Wt 158.0 lb

## 2021-12-16 DIAGNOSIS — Z09 Encounter for follow-up examination after completed treatment for conditions other than malignant neoplasm: Secondary | ICD-10-CM

## 2021-12-16 DIAGNOSIS — K621 Rectal polyp: Secondary | ICD-10-CM

## 2021-12-16 NOTE — Patient Instructions (Addendum)
Please pick up the Nifedipine Ointment at the Pharmacy listed below.  [facility] . [phone].   Avoid constipation. You may resume your normal activities.  How to Take a [employer] A sitz bath is a warm water bath that may be used to care for your rectum, genital area, or the area between your rectum and genitals (perineum). In a sitz bath, the water only comes up to your hips and covers your buttocks. A sitz bath may be done in a bathtub or with a portable sitz bath that fits over the toilet. Your health care provider may recommend a sitz bath to help: Relieve pain and discomfort after delivering a baby. Relieve pain and itching from hemorrhoids or anal fissures. Relieve pain after certain surgeries. Relax muscles that are sore or tight. How to take a sitz bath Take 3-4 sitz baths a day, or as many as told by your health care provider. Bathtub sitz bath To take a sitz bath in a bathtub: Partially fill a bathtub with warm water. The water should be deep enough to cover your hips and buttocks when you are sitting in the tub. Follow your health care provider's instructions if you are told to put medicine in the water. Sit in the water. Open the tub drain a little, and leave it open during your bath. Turn on the warm water again, enough to replace the water that is draining out. Keep the water running throughout your bath. This helps keep the water at the right level and temperature. Soak in the water for 15-20 minutes, or as long as told by your health care provider. When you are done, be careful when you stand up. You may feel dizzy. After the sitz bath, pat yourself dry. Do not rub your skin to dry it.  Over-the-toilet sitz bath To take a sitz bath with an over-the-toilet basin: Follow the manufacturer's instructions. Fill the basin with warm water. Follow your health care provider's instructions if you were told to put medicine in the water. Sit on the seat. Make sure the  water covers your buttocks and perineum. Soak in the water for 15-20 minutes, or as long as told by your health care provider. After the sitz bath, pat yourself dry. Do not rub your skin to dry it. Clean and dry the basin between uses. Discard the basin if it cracks, or according to the manufacturer's instructions.  Contact a health care provider if: Your pain or itching gets worse. Do not continue with sitz baths if your symptoms get worse. You have new symptoms. Do not continue with sitz baths until you talk with your health care provider. Summary A sitz bath is a warm water bath in which the water only comes up to your hips and covers your buttocks. A sitz bath may help relieve pain and discomfort after delivering a baby. It also may help with pain and itching from hemorrhoids or anal fissures, or pain after certain surgeries. It can also help to relax muscles that are sore or tight. Take 3-4 sitz baths a day, or as many as told by your health care provider. Soak in the water for 15-20 minutes. Do not continue with sitz baths if your symptoms get worse. This information is not intended to replace advice given to you by your health care provider. Make sure you discuss any questions you have with your health care provider. Document Revised: 07/12/2020 Document Reviewed: 07/12/2020 Elsevier Patient Education  2022 [employer].

## 2021-12-17 DIAGNOSIS — M25611 Stiffness of right shoulder, not elsewhere classified: Secondary | ICD-10-CM | POA: Diagnosis not present

## 2021-12-17 DIAGNOSIS — M25511 Pain in right shoulder: Secondary | ICD-10-CM | POA: Diagnosis not present

## 2021-12-18 ENCOUNTER — Telehealth: Payer: Self-pay

## 2021-12-18 NOTE — Progress Notes (Signed)
Surgical Consultation  12/18/2021  Brittany Farley is an 71 y.o. female.   Chief Complaint  Patient presents with   Routine Post Op     HPI: Brittany Farley is a 71 year old female status post transanal excision of rectal polyp.  He is doing well.  Brittany Farley continues to have chronic anorectal discomfort.  No really pain associated with the excision of the polyp.  Pathology discussed with her in detail [facility].  No bleeding  Past Medical History:  Diagnosis Date   Arthritis    right hand   Osteoporosis     Past Surgical History:  Procedure Laterality Date   ABDOMINAL HYSTERECTOMY  1980   had hysterectomy for fibroids. No cancer. Has both ovaries.    CATARACT EXTRACTION W/PHACO Right 02/20/2020   Procedure: CATARACT EXTRACTION PHACO AND INTRAOCULAR LENS PLACEMENT (IOC) RIGHT 2.33  00:24.3;  Surgeon: Eulogio Bear, MD;  Location: [place];  Service: Ophthalmology;  Laterality: Right;   CATARACT EXTRACTION W/PHACO Left 04/02/2020   Procedure: CATARACT EXTRACTION PHACO AND INTRAOCULAR LENS PLACEMENT ([place]) LEFT;  Surgeon: Eulogio Bear, MD;  Location: [place];  Service: Ophthalmology;  Laterality: Left;  1.68 0:28.8   CHOLECYSTECTOMY  2011   COLONOSCOPY WITH PROPOFOL N/A 11/29/2018   Procedure: COLONOSCOPY WITH PROPOFOL;  Surgeon: Virgel Manifold, MD;  Location: [facility];  Service: Endoscopy;  Laterality: N/A;   OOPHORECTOMY     TUMOR EXCISION N/A 11/28/2021   Procedure: TRANSRECTAL TUMOR EXCISION, polyp;  Surgeon: Jules Husbands, MD;  Location: [facility];  Service: General;  Laterality: N/A;    Family History  Problem Relation Age of Onset   Breast cancer Mother 65   Breast cancer Maternal Aunt 30   Lung cancer Sister    Diabetes Sister    Prostate cancer Brother    Heart disease Brother    Stroke Cousin    Hypertension Cousin     Social History:  reports that Brittany Farley has never smoked. Brittany Farley has never used smokeless tobacco. Brittany Farley reports that  Brittany Farley does not currently use alcohol. Brittany Farley reports that Brittany Farley does not use drugs.  Allergies: No Known Allergies  Medications reviewed.     ROS Full ROS performed and is otherwise negative other than what is stated in the HPI    BP (!) 144/83    Pulse 61    Temp 98.5 F (36.9 C)    Ht 5\' 5"  (1.651 m)    Wt 158 lb (71.7 kg)    SpO2 99%    BMI 26.29 kg/m   Physical Exam Vitals and nursing note reviewed. Exam conducted with a chaperone present.  Constitutional:      General: Brittany Farley is not in acute distress.    Appearance: Normal appearance. Brittany Farley is not ill-appearing.  Genitourinary:    Comments: No evidence of perineal sepsis, still tender postmidline likely fissure Musculoskeletal:        General: Normal range of motion.     Cervical back: Normal range of motion.  Skin:    General: Skin is warm and dry.     Capillary Refill: Capillary refill takes less than 2 seconds.  Neurological:     General: No focal deficit present.     Mental Status: Brittany Farley is alert and oriented to person, place, and time.  Psychiatric:        Mood and Affect: Mood normal.        Behavior: Behavior normal.        Thought Content:  Thought content normal.        Judgment: Judgment normal.      Assessment/Plan: 1. Rectal polyp w Benign pathology.  No need for further testing.  Regarding her chronic anorectal pain I do think that this is associated with fissure.  Recommended daily sitz bath's as well.  Nifedipine cream twice daily. RTC in 4 weeks or so Please note I spent more than 30 minutes in this encounter including personally reviewing records, counseling the patient and performing appropriate documentation  Caroleen Hamman, MD [place] Surgeon

## 2021-12-18 NOTE — Telephone Encounter (Signed)
Referral has been faxed to [facility].

## 2021-12-27 ENCOUNTER — Ambulatory Visit: Payer: Medicare Other | Admitting: Family

## 2021-12-30 ENCOUNTER — Other Ambulatory Visit: Payer: Self-pay | Admitting: Family

## 2021-12-30 DIAGNOSIS — E785 Hyperlipidemia, unspecified: Secondary | ICD-10-CM

## 2022-01-09 ENCOUNTER — Other Ambulatory Visit: Payer: Self-pay | Admitting: Internal Medicine

## 2022-01-13 ENCOUNTER — Encounter: Payer: Medicare Other | Admitting: Surgery

## 2022-01-14 DIAGNOSIS — M25652 Stiffness of left hip, not elsewhere classified: Secondary | ICD-10-CM | POA: Diagnosis not present

## 2022-01-14 DIAGNOSIS — M25651 Stiffness of right hip, not elsewhere classified: Secondary | ICD-10-CM | POA: Diagnosis not present

## 2022-01-15 ENCOUNTER — Ambulatory Visit: Payer: Medicare Other | Admitting: Internal Medicine

## 2022-01-17 ENCOUNTER — Encounter: Payer: Self-pay | Admitting: Family

## 2022-01-17 ENCOUNTER — Telehealth (INDEPENDENT_AMBULATORY_CARE_PROVIDER_SITE_OTHER): Payer: Medicare Other | Admitting: Family

## 2022-01-17 VITALS — Wt 155.2 lb

## 2022-01-17 DIAGNOSIS — M79604 Pain in right leg: Secondary | ICD-10-CM

## 2022-01-17 DIAGNOSIS — K5900 Constipation, unspecified: Secondary | ICD-10-CM | POA: Insufficient documentation

## 2022-01-17 DIAGNOSIS — M79605 Pain in left leg: Secondary | ICD-10-CM

## 2022-01-17 NOTE — Progress Notes (Signed)
Virtual Visit via Video Note ? ?I connected with@ ? on 01/17/22 at  4:00 PM EST by a video enabled telemedicine application and verified that I am speaking with the correct person using two identifiers. ? Location patient: home ?Location provider:work  ?Persons participating in the virtual visit: patient, provider ? ?I discussed the limitations of evaluation and management by telemedicine and the availability of in person appointments. The patient expressed understanding and agreed to proceed. ? ? ?HPI: ?Complains of constipation x 6-9 months, improved.  ?Bowel movement every 2-3 days.  ?No abdominal pain, abdominal pain, or distention. No rectal bleeding., dysuria.  ?She is taking miralax daily. BM is formed and brown.  ?Stool isnt hard and not straining.  ? ?She is taking miralax daily.  ? ?She describes previous abrupt episode of bilateral thighs pain and numbness which occurred 4 months ago.  She hasnt had recurrence of episode. No back pain, numbness or weakness in the legs. No leg swelling. ? ?Episode 4 months ago when she got out of bed and fell to the floor as felt a numbness in both upper legs. She had done back to back cycling classes that day. She has seen 11/01/21 by colleague Dr Caryl Bis at that time.  ? ?She bikes 4 times per week in cycling class.  ? ?Osteoporosis-compliant with Fosamax as managed by endocrinology ? ?She had a consult with general surgery, Dr. Dahlia Byes 12/16/2021 for rectal polyp with benign pathology.  No need for further testing. ? ?ROS: See pertinent positives and negatives per HPI. ? ? ? ?EXAM: ? ?VITALS per patient if applicable: ?Wt 683 lb 3.2 oz (70.4 kg)   BMI 25.83 kg/m?  ?BP Readings from Last 3 Encounters:  ?12/16/21 (!) 144/83  ?12/06/21 138/82  ?11/28/21 (!) 143/78  ? ?Wt Readings from Last 3 Encounters:  ?01/17/22 155 lb 3.2 oz (70.4 kg)  ?12/16/21 158 lb (71.7 kg)  ?12/06/21 157 lb 12.8 oz (71.6 kg)  ? ? ?GENERAL: alert, oriented, appears well and in no acute  distress ? ?HEENT: atraumatic, conjunttiva clear, no obvious abnormalities on inspection of external nose and ears ? ?NECK: normal movements of the head and neck ? ?LUNGS: on inspection no signs of respiratory distress, breathing rate appears normal, no obvious gross SOB, gasping or wheezing ? ?CV: no obvious cyanosis ? ?MS: moves all visible extremities without noticeable abnormality ? ?PSYCH/NEURO: pleasant and cooperative, no obvious depression or anxiety, speech and thought processing grossly intact ? ?ASSESSMENT AND PLAN: ? ?Discussed the following assessment and plan: ? ?Problem List Items Addressed This Visit   ? ?  ? Other  ? Constipation  ?  Slightly improved.  She has been taking MiraLAX daily.  Advised her to also take Colace and to start Metamucil for bowel health.  She will let me know how she is doing and certainly if it does not resolve.  May consider Linzess. ?  ?  ? Leg pain, bilateral - Primary  ?  Etiology nonspecific at this time.  Fortunately symptom has not recurred.  Patient and I jointly agreed an abundance of caution we will get baseline lumbar and pelvic x-rays.  She will certainly let me know if numbness or leg pain were to recur ?  ?  ? Relevant Orders  ? DG Lumbar Spine Complete  ? DG Hip Unilat W OR W/O Pelvis 2-3 Views Left  ? DG Hip Unilat W OR W/O Pelvis 2-3 Views Right  ? ? ?-we discussed possible serious and likely  etiologies, options for evaluation and workup, limitations of telemedicine visit vs in person visit, treatment, treatment risks and precautions. Pt prefers to treat via telemedicine empirically rather then risking or undertaking an in person visit at this moment.  ?. ?  ?I discussed the assessment and treatment plan with the patient. The patient was provided an opportunity to ask questions and all were answered. The patient agreed with the plan and demonstrated an understanding of the instructions. ?  ?The patient was advised to call back or seek an in-person evaluation  if the symptoms worsen or if the condition fails to improve as anticipated. ? ? ?[name], FNP  ?

## 2022-01-17 NOTE — Progress Notes (Signed)
Constipation. Thighs are a little flabbier than usual ?

## 2022-01-17 NOTE — Assessment & Plan Note (Signed)
Etiology nonspecific at this time.  Fortunately symptom has not recurred.  Patient and I jointly agreed an abundance of caution we will get baseline lumbar and pelvic x-rays.  She will certainly let me know if numbness or leg pain were to recur ?

## 2022-01-17 NOTE — Patient Instructions (Addendum)
Please call your office to schedule your x-rays next week and follow up with me in several weeks to ensure we are on the right track ? ? ?constipation plan ? ?1) start taking MiraLAX, half a dose every day or every other day and change the dose as needed after 3 to 5 days with goal of 1-2 soft bowel movements every day or every other day. MiraLAX is an osmotic laxative. That means it draws water into the colon, which softens the stool and may naturally stimulate the colon to contract. These actions help ease bowel movements. For example, you  may find that using the medication every other day or three times a week is a good bowel regimen for you. Or perhaps, twice weekly.  ? ?2) Take Colace ( stool softener) twice daily every day.  ? ?3) You may add Metamucil to a beverage that you drink. ? ?4)  If you do not get results with the above, you may then add Bisacodyl (dulocolax) suppository daily to regimen until you get desired bowel results. Please remember that stimulant laxatives such as senna and bisacodyl (above) trigger contractions in the bowels that push the stool along. But if you take stimulant laxatives too often, you could become dependent on them to have a bowel movement at all--possibly because the bowel has stopped functioning normally. Please let me know if you are having to use bisacodyl on a regular basis.  ? ? ? ?It is MOST important to drink LOTS of water and follow a HIGH fiber diet to keep foods moving through the gut.  ? ?Information on prevention of constipation as well as acute treatment for constipation as included below. ? ?If there is no improvement in your symptoms, or if there is any worsening of symptoms, or if you have any additional concerns, please return to this clinic for re-evaluation; or, if we are closed, consider going to the Emergency Room for evaluation. ? ? ? ?Constipation Prevention ?What is Constipation? ?Constipation is hard, dry bowel movements or the inability to have a  bowel movement.  You can also feel like you need to have a bowel movement but not be able to.  It can also be painful when you strain to have a bowel movement.  Taking narcotic pain medicine after surgery can make you constipated, even if you have never had a problem with constipation. ?What Do I Need To Do? ?The best thing to do for constipation is to keep it from happening.  This can be done by: ?Adding laxatives to your daily routine, when taking prescription pain medicines after surgery. Add 17 gm Miralax daily or 100 mg Colace once or twice daily. (Miralax is mixed in water. Colace is a pill). They soften your bowel movements to make them easier to pass and hurt less. ?Drink plenty of water to help flush your bowels.  (Eight, 8 ounce glasses daily) ?Eat foods high in fiber such as whole grains, vegetables, cereals, fruits, and prune juice (5-7 servings a day or 25 grams).  If you do not know how much fiber a food has in it you can look on the label under ?dietary fiber.?  ?If you have trouble getting enough fiber in your diet you may want to consider a fiber supplement such as Metamucil or Citrucel.  Also, be aware that eating fiber without drinking enough water can make constipation worse. ?If you do become constipated some medications that may help are: ?Bisacodyl (Dulcolax) is available in tablet form or  a suppository. ?Glycerin suppositories are also a good choice if you need a fast acting medication. ?Everybody is different and may have different results.  Talk to your pharmacist or health care provider about your specific problems. They can help you choose the best product for you.  ?Why Is It Important for Me To Do This? ?Being constipated is not something you have to live with.  There are many things you can do to help.   Feeling bad can interfere with your recovery after surgery.  If constipation goes on for too long it can become a very serious medical problem. You may need to visit your doctor or go  to the hospital.  That is why it is very important to drink lots of water, eat enough fiber, and keep it from happening.  ?Ask Questions ?We want to answer all of your questions and concerns.  That?s why we encourage you to use a program called Ask Me 3?, created by the [employer].  By using Ask Me 3? you are encouraged to ask 3 simple (yet, potentially life saving questions) whenever you are talking with your physician, nurse or pharmacist: ?What is my main problem? ?What do I need to do? ?Why is it important for me to do this? ?By understanding the answer to these three questions and any other questions you may have, you have the knowledge necessary to manage your health. Please feel very comfortable asking any questions. Healthcare is complicated, so if you hear an answer you do not understand, please ask your health care team to explain again. ?  ?Sources: ?[employer] ?Medline Plus ?08-29-10 ?N ? ?

## 2022-01-17 NOTE — Assessment & Plan Note (Signed)
Slightly improved.  She has been taking MiraLAX daily.  Advised her to also take Colace and to start Metamucil for bowel health.  She will let me know how she is doing and certainly if it does not resolve.  May consider Linzess. ?

## 2022-01-22 ENCOUNTER — Encounter: Payer: Self-pay | Admitting: *Deleted

## 2022-01-24 ENCOUNTER — Other Ambulatory Visit: Payer: Self-pay | Admitting: *Deleted

## 2022-01-24 ENCOUNTER — Other Ambulatory Visit: Payer: Medicare Other

## 2022-01-24 ENCOUNTER — Ambulatory Visit (INDEPENDENT_AMBULATORY_CARE_PROVIDER_SITE_OTHER): Payer: Medicare Other

## 2022-01-24 ENCOUNTER — Other Ambulatory Visit: Payer: Self-pay

## 2022-01-24 DIAGNOSIS — M79604 Pain in right leg: Secondary | ICD-10-CM

## 2022-01-24 DIAGNOSIS — M79605 Pain in left leg: Secondary | ICD-10-CM

## 2022-01-24 DIAGNOSIS — R200 Anesthesia of skin: Secondary | ICD-10-CM | POA: Diagnosis not present

## 2022-01-24 DIAGNOSIS — M545 Low back pain, unspecified: Secondary | ICD-10-CM | POA: Diagnosis not present

## 2022-01-24 DIAGNOSIS — R531 Weakness: Secondary | ICD-10-CM | POA: Diagnosis not present

## 2022-01-28 ENCOUNTER — Telehealth: Payer: Self-pay

## 2022-01-28 NOTE — Telephone Encounter (Signed)
LMTCB to get results from Xrays ?

## 2022-01-29 ENCOUNTER — Encounter: Payer: Self-pay | Admitting: Family

## 2022-01-31 NOTE — Telephone Encounter (Signed)
Pt scheduled 02/05/22. ?

## 2022-02-05 ENCOUNTER — Telehealth: Payer: Medicare Other | Admitting: Family

## 2022-02-05 DIAGNOSIS — M25551 Pain in right hip: Secondary | ICD-10-CM | POA: Insufficient documentation

## 2022-02-05 DIAGNOSIS — N2889 Other specified disorders of kidney and ureter: Secondary | ICD-10-CM | POA: Diagnosis not present

## 2022-02-05 DIAGNOSIS — M25552 Pain in left hip: Secondary | ICD-10-CM

## 2022-02-05 MED ORDER — MELOXICAM 7.5 MG PO TABS: 7.5000 mg | ORAL_TABLET | Freq: Every day | ORAL | 1 refills | Status: DC | PRN

## 2022-02-05 NOTE — Progress Notes (Signed)
Virtual Visit via Video Note ? ?I connected with@ ? on 02/07/22 at  4:00 PM EDT by a video enabled telemedicine application and verified that I am speaking with the correct person using two identifiers. ? Location patient: home ?Location provider:work  ?Persons participating in the virtual visit: patient, provider ? ?I discussed the limitations of evaluation and management by telemedicine and the availability of in person appointments. The patient expressed understanding and agreed to proceed. ? ? ?HPI: ?She complains of intermittent lateral hip pain and decreased range motion , more noticeable over the past year.  ?She can run and walk without pain. She has noticed when squats or when stretching legs apart, she feels pain. Pain with turning legs outward.  ?No groin or leg numbness, falls, pain with walking, swelling in legs, flank pain, dysuria, hematuria, chills, nausea.   ? ?She takes tylenol for pain prn with relief.  ? ?Previously had episode of bilateral thigh pain 5 months ago, since hasnt recurred.  ?History of osteoporosis-compliant with Fosamax ? ?X-ray lumbar spine 01/24/2022 showed calcific densities left flank and left kidney.  Mild lumbar spine scoliosis.  Diffuse multilevel disc degeneration, severe hypertrophy.  Severe degenerative changes both hips ? ? ?ROS: See pertinent positives and negatives per HPI. ? ? ? ?EXAM: ? ?VITALS per patient if applicable: ?There were no vitals taken for this visit. ?BP Readings from Last 3 Encounters:  ?12/16/21 (!) 144/83  ?12/06/21 138/82  ?11/28/21 (!) 143/78  ? ?Wt Readings from Last 3 Encounters:  ?01/17/22 155 lb 3.2 oz (70.4 kg)  ?12/16/21 158 lb (71.7 kg)  ?12/06/21 157 lb 12.8 oz (71.6 kg)  ? ? ?GENERAL: alert, oriented, appears well and in no acute distress ? ?HEENT: atraumatic, conjunttiva clear, no obvious abnormalities on inspection of external nose and ears ? ?NECK: normal movements of the head and neck ? ?LUNGS: on inspection no signs of respiratory  distress, breathing rate appears normal, no obvious gross SOB, gasping or wheezing ? ?CV: no obvious cyanosis ? ?MS: moves all visible extremities without noticeable abnormality ? ?PSYCH/NEURO: pleasant and cooperative, no obvious depression or anxiety, speech and thought processing grossly intact ? ?ASSESSMENT AND PLAN: ? ?Discussed the following assessment and plan: ? ?Problem List Items Addressed This Visit   ? ?  ? Other  ? Bilateral hip pain - Primary  ?  Discussed hip pain and reviewed images of lumbar spine, bilateral hips.  Discussed multilevel disc degeneration, severe degenerative changes of both hips.  Provided her with meloxicam to take as needed and encouraged continued exercise although counseled her on the importance of listening to pain and if she feels pain and she needs to stop a particular movement or exercise.  We jointly agreed to arrange orthopedics consult for second opinion as it relates to nonsurgical and potential surgical options based on severity of degenerative changes.  No claudication symptoms at this time.  Pending CT abdomen and pelvis to further investigate calcific densities.  ?  ?  ? Relevant Medications  ? meloxicam (MOBIC) 7.5 MG tablet  ? Other Relevant Orders  ? CT ABDOMEN PELVIS W CONTRAST  ? Ambulatory referral to Orthopedic Surgery  ? ?Other Visit Diagnoses   ? ? Other specified disorders of kidney and ureter      ? Relevant Orders  ? CT ABDOMEN PELVIS W CONTRAST  ? ?  ? ? ?-we discussed possible serious and likely etiologies, options for evaluation and workup, limitations of telemedicine visit vs in person visit, treatment,  treatment risks and precautions. Pt prefers to treat via telemedicine empirically rather then risking or undertaking an in person visit at this moment.  ?. ?  ?I discussed the assessment and treatment plan with the patient. The patient was provided an opportunity to ask questions and all were answered. The patient agreed with the plan and demonstrated  an understanding of the instructions. ?  ?The patient was advised to call back or seek an in-person evaluation if the symptoms worsen or if the condition fails to improve as anticipated. ? ? ?[name], FNP  ?

## 2022-02-05 NOTE — Patient Instructions (Signed)
Start mobic as needed ? ?A couple of points in regards to meloxicam ( Mobic) - ? ?This medication is not intended for daily , long term use. It is a potent anti inflammatory ( NSAID), and my intention is for you take as needed for moderate to severe pain. If you find yourself using daily, please let me know.  ? ?Please takes Mobic ( meloxicam) with FOOD since it is an anti-inflammatory as it can cause a GI bleed or ulcer. If you have a history of GI bleed or ulcer, please do NOT take.  ?Do no take over the counter aleve, motrin, advil, goody's powder for pain as they are also NSAIDs, and they are  in the same class as Mobic ? ?Lastly, we will need to monitor kidney function while on Mobic, and if we were to see any decline in kidney function in the future, we would have to discontinue this medication.  ? ? ? ?I  placed a referral to orthopedics.  I ordered a CT abdomen pelvis as well ? ?Let us know if you dont hear back within a week in regards to an appointment being scheduled.  ? ?

## 2022-02-06 ENCOUNTER — Encounter: Payer: Self-pay | Admitting: Family

## 2022-02-07 NOTE — Assessment & Plan Note (Addendum)
Discussed hip pain and reviewed images of lumbar spine, bilateral hips.  Discussed multilevel disc degeneration, severe degenerative changes of both hips.  Provided her with meloxicam to take as needed and encouraged continued exercise although counseled her on the importance of listening to pain and if she feels pain and she needs to stop a particular movement or exercise.  We jointly agreed to arrange orthopedics consult for second opinion as it relates to nonsurgical and potential surgical options based on severity of degenerative changes.  No claudication symptoms at this time.  Pending CT abdomen and pelvis to further investigate calcific densities.  ?

## 2022-02-10 ENCOUNTER — Telehealth: Payer: Self-pay | Admitting: Family

## 2022-02-10 NOTE — Telephone Encounter (Signed)
Pt will need to have CMET lab done prior to CT appt on 02/20/22. Please advise and Thank you! ?

## 2022-02-10 NOTE — Telephone Encounter (Signed)
Does this need to be ordered for the hospital or here? ?

## 2022-02-12 NOTE — Telephone Encounter (Signed)
See below ?Order CMP lab and schedule 2-3 days prior to 02/20/22 ?She may have done here ? ?

## 2022-02-17 ENCOUNTER — Other Ambulatory Visit: Payer: Self-pay

## 2022-02-17 DIAGNOSIS — E785 Hyperlipidemia, unspecified: Secondary | ICD-10-CM

## 2022-02-17 NOTE — Telephone Encounter (Signed)
Lab appointment sheduled for tomorrow at  2:15 ?

## 2022-02-18 ENCOUNTER — Other Ambulatory Visit (INDEPENDENT_AMBULATORY_CARE_PROVIDER_SITE_OTHER): Payer: Medicare Other

## 2022-02-18 DIAGNOSIS — D721 Eosinophilia, unspecified: Secondary | ICD-10-CM | POA: Diagnosis not present

## 2022-02-19 ENCOUNTER — Other Ambulatory Visit: Payer: Self-pay | Admitting: Family

## 2022-02-19 DIAGNOSIS — D7219 Other eosinophilia: Secondary | ICD-10-CM

## 2022-02-19 LAB — CBC WITH DIFFERENTIAL/PLATELET
Basophils Absolute: 0 10*3/uL (ref 0.0–0.1)
Basophils Relative: 0.6 % (ref 0.0–3.0)
Eosinophils Absolute: 0.4 10*3/uL (ref 0.0–0.7)
Eosinophils Relative: 6.9 % — ABNORMAL HIGH (ref 0.0–5.0)
HCT: 38.9 % (ref 36.0–46.0)
Hemoglobin: 12.8 g/dL (ref 12.0–15.0)
Lymphocytes Relative: 29.5 % (ref 12.0–46.0)
Lymphs Abs: 1.7 10*3/uL (ref 0.7–4.0)
MCHC: 32.9 g/dL (ref 30.0–36.0)
MCV: 79.5 fl (ref 78.0–100.0)
Monocytes Absolute: 0.4 10*3/uL (ref 0.1–1.0)
Monocytes Relative: 6.4 % (ref 3.0–12.0)
Neutro Abs: 3.3 10*3/uL (ref 1.4–7.7)
Neutrophils Relative %: 56.6 % (ref 43.0–77.0)
Platelets: 328 10*3/uL (ref 150.0–400.0)
RBC: 4.89 Mil/uL (ref 3.87–5.11)
RDW: 13.8 % (ref 11.5–15.5)
WBC: 5.8 10*3/uL (ref 4.0–10.5)

## 2022-02-20 ENCOUNTER — Ambulatory Visit
Admission: RE | Admit: 2022-02-20 | Discharge: 2022-02-20 | Disposition: A | Discharge status: Home / Self Care | Payer: Medicare Other | Source: Ambulatory Visit | Attending: Family | Admitting: Family

## 2022-02-20 ENCOUNTER — Other Ambulatory Visit: Payer: Medicare Other

## 2022-02-20 DIAGNOSIS — N2889 Other specified disorders of kidney and ureter: Secondary | ICD-10-CM | POA: Insufficient documentation

## 2022-02-20 DIAGNOSIS — M25552 Pain in left hip: Secondary | ICD-10-CM | POA: Diagnosis not present

## 2022-02-20 DIAGNOSIS — R109 Unspecified abdominal pain: Secondary | ICD-10-CM | POA: Diagnosis not present

## 2022-02-20 DIAGNOSIS — M25551 Pain in right hip: Secondary | ICD-10-CM | POA: Insufficient documentation

## 2022-02-20 DIAGNOSIS — I700 Atherosclerosis of aorta: Secondary | ICD-10-CM | POA: Diagnosis not present

## 2022-02-20 MED ORDER — IOHEXOL 300 MG/ML  SOLN
100.0000 mL | Freq: Once | INTRAMUSCULAR | Status: AC | PRN
  Administered 2022-02-20: 100 mL via INTRAVENOUS

## 2022-02-21 ENCOUNTER — Encounter: Payer: Self-pay | Admitting: Family

## 2022-02-21 LAB — POCT I-STAT CREATININE: Creatinine, Ser: 0.7 mg/dL (ref 0.44–1.00)

## 2022-02-25 ENCOUNTER — Encounter: Payer: Self-pay | Admitting: Family

## 2022-02-28 ENCOUNTER — Other Ambulatory Visit: Payer: Self-pay | Admitting: Family

## 2022-02-28 ENCOUNTER — Encounter: Payer: Self-pay | Admitting: Family

## 2022-02-28 DIAGNOSIS — M545 Low back pain, unspecified: Secondary | ICD-10-CM | POA: Diagnosis not present

## 2022-02-28 DIAGNOSIS — K760 Fatty (change of) liver, not elsewhere classified: Secondary | ICD-10-CM

## 2022-02-28 DIAGNOSIS — M169 Osteoarthritis of hip, unspecified: Secondary | ICD-10-CM | POA: Diagnosis not present

## 2022-03-18 ENCOUNTER — Other Ambulatory Visit: Payer: Self-pay | Admitting: Family

## 2022-03-18 DIAGNOSIS — M25551 Pain in right hip: Secondary | ICD-10-CM

## 2022-04-15 ENCOUNTER — Telehealth: Payer: Self-pay | Admitting: Family

## 2022-04-15 NOTE — Telephone Encounter (Signed)
Copied from [place]. Topic: Medicare AWV >> Apr 15, 2022  9:59 AM Harris-Coley, Hannah Beat wrote: Reason for CRM: Left message for patient to schedule Annual Wellness Visit.  Please schedule with Nurse Health Advisor Denisa O'Brien-Blaney, LPN at [facility].  Please call [phone] ask for [facility]

## 2022-04-25 DIAGNOSIS — M1611 Unilateral primary osteoarthritis, right hip: Secondary | ICD-10-CM | POA: Diagnosis not present

## 2022-05-10 HISTORY — PX: REPLACEMENT TOTAL HIP W/  RESURFACING IMPLANTS: SUR1222

## 2022-05-12 ENCOUNTER — Telehealth: Payer: Self-pay | Admitting: Family

## 2022-05-12 NOTE — Telephone Encounter (Signed)
Copied from [place]. Topic: Medicare AWV >> May 12, 2022  1:28 PM Devoria Glassing wrote: Reason for CRM: Left message for patient to schedule Annual Wellness Visit.  Please schedule with Nurse Health Advisor Denisa O'Brien-Blaney, LPN at [facility].  Please call [phone] ask for [facility]

## 2022-05-23 DIAGNOSIS — M1611 Unilateral primary osteoarthritis, right hip: Secondary | ICD-10-CM | POA: Diagnosis not present

## 2022-05-29 DIAGNOSIS — M25851 Other specified joint disorders, right hip: Secondary | ICD-10-CM | POA: Diagnosis not present

## 2022-05-29 DIAGNOSIS — Z9884 Bariatric surgery status: Secondary | ICD-10-CM | POA: Diagnosis not present

## 2022-05-29 DIAGNOSIS — M1611 Unilateral primary osteoarthritis, right hip: Secondary | ICD-10-CM | POA: Diagnosis not present

## 2022-05-29 DIAGNOSIS — E785 Hyperlipidemia, unspecified: Secondary | ICD-10-CM | POA: Diagnosis not present

## 2022-05-29 DIAGNOSIS — M25751 Osteophyte, right hip: Secondary | ICD-10-CM | POA: Diagnosis not present

## 2022-05-29 DIAGNOSIS — Z7982 Long term (current) use of aspirin: Secondary | ICD-10-CM | POA: Diagnosis not present

## 2022-05-29 DIAGNOSIS — E119 Type 2 diabetes mellitus without complications: Secondary | ICD-10-CM | POA: Diagnosis not present

## 2022-05-30 DIAGNOSIS — E119 Type 2 diabetes mellitus without complications: Secondary | ICD-10-CM | POA: Diagnosis not present

## 2022-05-30 DIAGNOSIS — Z96641 Presence of right artificial hip joint: Secondary | ICD-10-CM | POA: Diagnosis not present

## 2022-05-30 DIAGNOSIS — E785 Hyperlipidemia, unspecified: Secondary | ICD-10-CM | POA: Diagnosis not present

## 2022-05-30 DIAGNOSIS — M1611 Unilateral primary osteoarthritis, right hip: Secondary | ICD-10-CM | POA: Diagnosis not present

## 2022-05-30 DIAGNOSIS — Z7982 Long term (current) use of aspirin: Secondary | ICD-10-CM | POA: Diagnosis not present

## 2022-05-30 DIAGNOSIS — Z9884 Bariatric surgery status: Secondary | ICD-10-CM | POA: Diagnosis not present

## 2022-05-30 DIAGNOSIS — M25751 Osteophyte, right hip: Secondary | ICD-10-CM | POA: Diagnosis not present

## 2022-05-30 DIAGNOSIS — M25851 Other specified joint disorders, right hip: Secondary | ICD-10-CM | POA: Diagnosis not present

## 2022-06-03 ENCOUNTER — Telehealth: Payer: Self-pay | Admitting: Family

## 2022-06-03 NOTE — Telephone Encounter (Signed)
Spoke with patient she was recovering from hip surgery.  Req CB 08/2022

## 2022-06-04 DIAGNOSIS — Z96649 Presence of unspecified artificial hip joint: Secondary | ICD-10-CM | POA: Insufficient documentation

## 2022-06-05 DIAGNOSIS — M25551 Pain in right hip: Secondary | ICD-10-CM | POA: Diagnosis not present

## 2022-06-05 DIAGNOSIS — M25651 Stiffness of right hip, not elsewhere classified: Secondary | ICD-10-CM | POA: Diagnosis not present

## 2022-06-11 DIAGNOSIS — M25651 Stiffness of right hip, not elsewhere classified: Secondary | ICD-10-CM | POA: Diagnosis not present

## 2022-06-11 DIAGNOSIS — M25551 Pain in right hip: Secondary | ICD-10-CM | POA: Diagnosis not present

## 2022-06-16 ENCOUNTER — Telehealth: Payer: Self-pay | Admitting: Family

## 2022-06-16 NOTE — Telephone Encounter (Signed)
Copied from [place] [id number]. Topic: Medicare AWV >> Jun 16, 2022 10:23 AM Devoria Glassing wrote: Reason for CRM: Left message for patient to schedule Annual Wellness Visit.  Please schedule with Nurse Health Advisor Denisa O'Brien-Blaney, LPN at [facility]. This appt can be telephone or office visit.  Please call [phone] ask for [facility]

## 2022-06-24 DIAGNOSIS — M25551 Pain in right hip: Secondary | ICD-10-CM | POA: Diagnosis not present

## 2022-06-24 DIAGNOSIS — M25651 Stiffness of right hip, not elsewhere classified: Secondary | ICD-10-CM | POA: Diagnosis not present

## 2022-06-26 DIAGNOSIS — M25551 Pain in right hip: Secondary | ICD-10-CM | POA: Diagnosis not present

## 2022-06-26 DIAGNOSIS — M25651 Stiffness of right hip, not elsewhere classified: Secondary | ICD-10-CM | POA: Diagnosis not present

## 2022-07-01 DIAGNOSIS — M25551 Pain in right hip: Secondary | ICD-10-CM | POA: Diagnosis not present

## 2022-07-01 DIAGNOSIS — M25651 Stiffness of right hip, not elsewhere classified: Secondary | ICD-10-CM | POA: Diagnosis not present

## 2022-07-03 DIAGNOSIS — M25651 Stiffness of right hip, not elsewhere classified: Secondary | ICD-10-CM | POA: Diagnosis not present

## 2022-07-03 DIAGNOSIS — M25551 Pain in right hip: Secondary | ICD-10-CM | POA: Diagnosis not present

## 2022-07-07 DIAGNOSIS — M25551 Pain in right hip: Secondary | ICD-10-CM | POA: Diagnosis not present

## 2022-07-07 DIAGNOSIS — M25651 Stiffness of right hip, not elsewhere classified: Secondary | ICD-10-CM | POA: Diagnosis not present

## 2022-07-09 DIAGNOSIS — Z96641 Presence of right artificial hip joint: Secondary | ICD-10-CM | POA: Diagnosis not present

## 2022-07-10 ENCOUNTER — Ambulatory Visit: Payer: Medicare Other | Admitting: Gastroenterology

## 2022-07-10 DIAGNOSIS — M25651 Stiffness of right hip, not elsewhere classified: Secondary | ICD-10-CM | POA: Diagnosis not present

## 2022-07-10 DIAGNOSIS — M25551 Pain in right hip: Secondary | ICD-10-CM | POA: Diagnosis not present

## 2022-07-15 DIAGNOSIS — M25551 Pain in right hip: Secondary | ICD-10-CM | POA: Diagnosis not present

## 2022-07-15 DIAGNOSIS — M25651 Stiffness of right hip, not elsewhere classified: Secondary | ICD-10-CM | POA: Diagnosis not present

## 2022-07-17 DIAGNOSIS — M25651 Stiffness of right hip, not elsewhere classified: Secondary | ICD-10-CM | POA: Diagnosis not present

## 2022-07-17 DIAGNOSIS — M25551 Pain in right hip: Secondary | ICD-10-CM | POA: Diagnosis not present

## 2022-07-25 ENCOUNTER — Ambulatory Visit (INDEPENDENT_AMBULATORY_CARE_PROVIDER_SITE_OTHER): Payer: Medicare Other

## 2022-07-25 VITALS — Ht 65.0 in | Wt 155.0 lb

## 2022-07-25 DIAGNOSIS — M25651 Stiffness of right hip, not elsewhere classified: Secondary | ICD-10-CM | POA: Diagnosis not present

## 2022-07-25 DIAGNOSIS — M25551 Pain in right hip: Secondary | ICD-10-CM | POA: Diagnosis not present

## 2022-07-25 DIAGNOSIS — Z0000 Encounter for general adult medical examination without abnormal findings: Secondary | ICD-10-CM | POA: Diagnosis not present

## 2022-07-25 NOTE — Patient Instructions (Addendum)
Brittany Farley , Thank you for taking time to come for your Medicare Wellness Visit. I appreciate your ongoing commitment to your health goals. Please review the following plan we discussed and let me know if I can assist you in the future.   These are the goals we discussed:  Goals      Maintain Healthy Lifestyle     Stay active. Healthy diet.        This is a list of the screening recommended for you and due dates:  Health Maintenance  Topic Date Due   COVID-19 Vaccine (6 - Pfizer series) 08/10/2022*   Pneumonia Vaccine (2 - PPSV23 or PCV20) 02/06/2023*   Flu Shot  02/08/2023*   Mammogram  07/06/2023   Tetanus Vaccine  03/18/2027   Colon Cancer Screening  11/29/2028   DEXA scan (bone density measurement)  Completed   Hepatitis C Screening: USPSTF Recommendation to screen - Ages 54-79 yo.  Completed   Zoster (Shingles) Vaccine  Completed   HPV Vaccine  Aged Out  *Topic was postponed. The date shown is not the original due date.    Next appointment: Follow up in one year for your annual wellness visit    Preventive Care 65 Years and Older, Female Preventive care refers to lifestyle choices and visits with your health care provider that can promote health and wellness. What does preventive care include? A yearly physical exam. This is also called an annual well check. Dental exams once or twice a year. Routine eye exams. Ask your health care provider how often you should have your eyes checked. Personal lifestyle choices, including: Daily care of your teeth and gums. Regular physical activity. Eating a healthy diet. Avoiding tobacco and drug use. Limiting alcohol use. Practicing safe sex. Taking low-dose aspirin every day. Taking vitamin and mineral supplements as recommended by your health care provider. What happens during an annual well check? The services and screenings done by your health care provider during your annual well check will depend on your age, overall  health, lifestyle risk factors, and family history of disease. Counseling  Your health care provider may ask you questions about your: Alcohol use. Tobacco use. Drug use. Emotional well-being. Home and relationship well-being. Sexual activity. Eating habits. History of falls. Memory and ability to understand (cognition). Work and work Statistician. Reproductive health. Screening  You may have the following tests or measurements: Height, weight, and BMI. Blood pressure. Lipid and cholesterol levels. These may be checked every 5 years, or more frequently if you are over 30 years old. Skin check. Lung cancer screening. You may have this screening every year starting at age 74 if you have a 30-pack-year history of smoking and currently smoke or have quit within the past 15 years. Fecal occult blood test (FOBT) of the stool. You may have this test every year starting at age 69. Flexible sigmoidoscopy or colonoscopy. You may have a sigmoidoscopy every 5 years or a colonoscopy every 10 years starting at age 20. Hepatitis C blood test. Hepatitis B blood test. Sexually transmitted disease (STD) testing. Diabetes screening. This is done by checking your blood sugar (glucose) after you have not eaten for a while (fasting). You may have this done every 1-3 years. Bone density scan. This is done to screen for osteoporosis. You may have this done starting at age 5. Mammogram. This may be done every 1-2 years. Talk to your health care provider about how often you should have regular mammograms. Talk with your health  care provider about your test results, treatment options, and if necessary, the need for more tests. Vaccines  Your health care provider may recommend certain vaccines, such as: Influenza vaccine. This is recommended every year. Tetanus, diphtheria, and acellular pertussis (Tdap, Td) vaccine. You may need a Td booster every 10 years. Zoster vaccine. You may need this after age  41. Pneumococcal 13-valent conjugate (PCV13) vaccine. One dose is recommended after age 17. Pneumococcal polysaccharide (PPSV23) vaccine. One dose is recommended after age 73. Talk to your health care provider about which screenings and vaccines you need and how often you need them. This information is not intended to replace advice given to you by your health care provider. Make sure you discuss any questions you have with your health care provider. Document Released: 11/23/2015 Document Revised: 07/16/2016 Document Reviewed: 08/28/2015 Elsevier Interactive Patient Education  2017 [employer] Prevention in the Home Falls can cause injuries. They can happen to people of all ages. There are many things you can do to make your home safe and to help prevent falls. What can I do on the outside of my home? Regularly fix the edges of walkways and driveways and fix any cracks. Remove anything that might make you trip as you walk through a door, such as a raised step or threshold. Trim any bushes or trees on the path to your home. Use bright outdoor lighting. Clear any walking paths of anything that might make someone trip, such as rocks or tools. Regularly check to see if handrails are loose or broken. Make sure that both sides of any steps have handrails. Any raised decks and porches should have guardrails on the edges. Have any leaves, snow, or ice cleared regularly. Use sand or salt on walking paths during winter. Clean up any spills in your garage right away. This includes oil or grease spills. What can I do in the bathroom? Use night lights. Install grab bars by the toilet and in the tub and shower. Do not use towel bars as grab bars. Use non-skid mats or decals in the tub or shower. If you need to sit down in the shower, use a plastic, non-slip stool. Keep the floor dry. Clean up any water that spills on the floor as soon as it happens. Remove soap buildup in the tub or shower  regularly. Attach bath mats securely with double-sided non-slip rug tape. Do not have throw rugs and other things on the floor that can make you trip. What can I do in the bedroom? Use night lights. Make sure that you have a light by your bed that is easy to reach. Do not use any sheets or blankets that are too big for your bed. They should not hang down onto the floor. Have a firm chair that has side arms. You can use this for support while you get dressed. Do not have throw rugs and other things on the floor that can make you trip. What can I do in the kitchen? Clean up any spills right away. Avoid walking on wet floors. Keep items that you use a lot in easy-to-reach places. If you need to reach something above you, use a strong step stool that has a grab bar. Keep electrical cords out of the way. Do not use floor polish or wax that makes floors slippery. If you must use wax, use non-skid floor wax. Do not have throw rugs and other things on the floor that can make you trip. What can  I do with my stairs? Do not leave any items on the stairs. Make sure that there are handrails on both sides of the stairs and use them. Fix handrails that are broken or loose. Make sure that handrails are as long as the stairways. Check any carpeting to make sure that it is firmly attached to the stairs. Fix any carpet that is loose or worn. Avoid having throw rugs at the top or bottom of the stairs. If you do have throw rugs, attach them to the floor with carpet tape. Make sure that you have a light switch at the top of the stairs and the bottom of the stairs. If you do not have them, ask someone to add them for you. What else can I do to help prevent falls? Wear shoes that: Do not have high heels. Have rubber bottoms. Are comfortable and fit you well. Are closed at the toe. Do not wear sandals. If you use a stepladder: Make sure that it is fully opened. Do not climb a closed stepladder. Make sure that  both sides of the stepladder are locked into place. Ask someone to hold it for you, if possible. Clearly mark and make sure that you can see: Any grab bars or handrails. First and last steps. Where the edge of each step is. Use tools that help you move around (mobility aids) if they are needed. These include: Canes. Walkers. Scooters. Crutches. Turn on the lights when you go into a dark area. Replace any light bulbs as soon as they burn out. Set up your furniture so you have a clear path. Avoid moving your furniture around. If any of your floors are uneven, fix them. If there are any pets around you, be aware of where they are. Review your medicines with your doctor. Some medicines can make you feel dizzy. This can increase your chance of falling. Ask your doctor what other things that you can do to help prevent falls. This information is not intended to replace advice given to you by your health care provider. Make sure you discuss any questions you have with your health care provider. Document Released: 08/23/2009 Document Revised: 04/03/2016 Document Reviewed: 12/01/2014 Elsevier Interactive Patient Education  2017 [employer].

## 2022-07-25 NOTE — Progress Notes (Signed)
Subjective:   Brittany Farley is a 71 y.o. female who presents for Medicare Annual (Subsequent) preventive examination.  Review of Systems    No ROS.  Medicare Wellness Virtual Visit.  Visual/audio telehealth visit, UTA vital signs.   See social history for additional risk factors.   Cardiac Risk Factors include: advanced age (>27mn, >>37women)     Objective:    Today's Vitals   07/25/22 1344  Weight: 155 lb (70.3 kg)  Height: '5\' 5"'$  (1.651 m)   Body mass index is 25.79 kg/m.     07/25/2022    1:47 PM 11/28/2021    7:48 AM 11/21/2021    9:29 AM 04/15/2021    8:27 AM 04/02/2020   10:39 AM 02/20/2020    7:37 AM 11/29/2018   12:54 PM  Advanced Directives  Does Patient Have a Medical Advance Directive? No Yes No No Yes Yes No  Type of Advance Directive  Living will   Living will;Healthcare Power of [place] will   Does patient want to make changes to medical advance directive?     No - Patient declined No - Patient declined   Copy of [place] Chart?     No - copy requested No - copy requested   Would patient like information on creating a medical advance directive? No - Patient declined   No - Patient declined No - Patient declined      Current Medications (verified) Outpatient Encounter Medications as of 07/25/2022  Medication Sig   alendronate (FOSAMAX) 70 MG tablet TAKE 1 TABLET BY MOUTH  WEEKLY WITH 8 OZ OF PLAIN  WATER 30 MINUTES BEFORE  FIRST FOOD, DRINK OR MEDS.  STAY UPRIGHT FOR 30 MINS   aspirin EC 81 MG tablet Take 81 mg by mouth daily.   atorvastatin (LIPITOR) 10 MG tablet TAKE 1 TABLET BY MOUTH  DAILY   Calcium Carbonate-Vit D-Min (CALCIUM 1200 PO) Take by mouth in the morning and at bedtime.   Cholecalciferol (VITAMIN D3) 2000 units TABS Take by mouth.   meloxicam (MOBIC) 7.5 MG tablet TAKE 1 TABLET BY MOUTH DAILY AS  NEEDED FOR PAIN   Multiple Vitamin (MULTIVITAMIN ADULT PO) Take by mouth.   No  facility-administered encounter medications on file as of 07/25/2022.    Allergies (verified) Patient has no known allergies.   History: Past Medical History:  Diagnosis Date   Arthritis    right hand   Osteoporosis    Past Surgical History:  Procedure Laterality Date   ABDOMINAL HYSTERECTOMY  1980   had hysterectomy for fibroids. No cancer. Has both ovaries.    BARIATRIC SURGERY     late 60's   CATARACT EXTRACTION W/PHACO Right 02/20/2020   Procedure: CATARACT EXTRACTION PHACO AND INTRAOCULAR LENS PLACEMENT (IOC) RIGHT 2.33  00:24.3;  Surgeon: KEulogio Bear MD;  [facility]  Service: Ophthalmology;  Laterality: Right;   CATARACT EXTRACTION W/PHACO Left 04/02/2020   Procedure: CATARACT EXTRACTION PHACO AND INTRAOCULAR LENS PLACEMENT (IVarna LEFT;  Surgeon: KEulogio Bear MD;  Location[facility]  Service: Ophthalmology;  Laterality: Left;  1.68 0:28.8   CHOLECYSTECTOMY  2011   COLONOSCOPY WITH PROPOFOL N/A 11/29/2018   Procedure: COLONOSCOPY WITH PROPOFOL;  Surgeon: TVirgel Manifold MD;  Location: [facility];  Service: Endoscopy;  Laterality: N/A;   OOPHORECTOMY     TUMOR EXCISION N/A 11/28/2021   Procedure: TRANSRECTAL TUMOR EXCISION, polyp;  Surgeon: PJules Husbands MD;  Location: [facility];  Service: General;  Laterality: N/A;   Family History  Problem Relation Age of Onset   Breast cancer Mother 52   Breast cancer Maternal Aunt 89   Lung cancer Sister    Diabetes Sister    Prostate cancer Brother    Heart disease Brother    Stroke Cousin    Hypertension Cousin    Social History   Socioeconomic History   Marital status: Single    Spouse name: Not on file   Number of children: Not on file   Years of education: Not on file   Highest education level: Not on file  Occupational History   Not on file  Tobacco Use   Smoking status: Never   Smokeless tobacco: Never  Vaping Use   Vaping Use: Never used  Substance and  Sexual Activity   Alcohol use: Not Currently    Comment: occasional - 1-2 drinks/year   Drug use: Never   Sexual activity: Not on file  Other Topics Concern   Not on file  Social History Narrative   From [place] 2018; Moved to [place]; Retired      Community education officer; 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       Social Determinants of Health   Financial Resource Strain: Low Risk  (07/25/2022)   Overall Financial Resource Strain (CARDIA)    Difficulty of Paying Living Expenses: Not very hard  Food Insecurity: No Food Insecurity (07/25/2022)   Hunger Vital Sign    Worried About Running Out of Food in the Last Year: Never true    Ran Out of Food in the Last Year: Never true  Transportation Needs: No Transportation Needs (07/25/2022)   PRAPARE - Hydrologist (Medical): No    Lack of Transportation (Non-Medical): No  Physical Activity: Sufficiently Active (07/25/2022)   Exercise Vital Sign    Days of Exercise per Week: 7 days    Minutes of Exercise per Session: 60 min  Stress: No Stress Concern Present (07/25/2022)   [place]    Feeling of Stress : Not at all  Social Connections: Unknown (07/25/2022)   Social Connection and Isolation Panel [NHANES]    Frequency of Communication with Friends and Family: More than three times a week    Frequency of Social Gatherings with Friends and Family: More than three times a week    Attends Religious Services: Not on Advertising copywriter or Organizations: Not on file    Attends Archivist Meetings: Not on file    Marital Status: Not on file    Tobacco Counseling Counseling given: Not Answered   Clinical Intake:  Pre-visit preparation completed: Yes        Diabetes: No  How often do you need to have someone help you when you read instructions, pamphlets, or other written materials from your doctor or pharmacy?: 1 -  Never    Interpreter Needed?: No      Activities of Daily Living    07/25/2022    1:45 PM 11/21/2021    9:30 AM  In your present state of health, do you have any difficulty performing the following activities:  Hearing? 0   Vision? 0   Difficulty concentrating or making decisions? 0   Walking or climbing stairs? 0   Dressing or bathing? 0   Doing errands, shopping? 0 0  Preparing Food and eating ?  N   Using the Toilet? N   In the past six months, have you accidently leaked urine? N   Do you have problems with loss of bowel control? N   Managing your Medications? N   Managing your Finances? N   Housekeeping or managing your Housekeeping? N     Patient Care Team: Burnard Hawthorne, FNP as PCP - General (Family Medicine)  Indicate any recent Medical Services you may have received from other than [facility] providers in the past year (date may be approximate).     Assessment:   This is a routine wellness examination for [facility].  Virtual Visit via Telephone Note  I connected with  Brittany Farley on 07/25/22 at  1:30 PM EDT by telephone and verified that I am speaking with the correct person using two identifiers.  Location: Patient: home Provider: office Persons participating in the virtual visit: patient/Nurse Health Advisor   I discussed the limitations of performing an evaluation and management service by telehealth. We continued and completed visit with audio only. Some vital signs may be absent or patient reported.   Hearing/Vision screen Hearing Screening - Comments:: Patient is able to hear conversational tones without difficulty. No issues reported.  Vision Screening - Comments:: Followed by [facility], Dr. Edison Pace  Wears reader lenses only  Cataract extraction, bilateral  They have seen their ophthalmologist in the last 12 months  Dietary issues and exercise activities discussed: Current Exercise Habits: Home exercise routine, Type of exercise:  calisthenics;strength training/weights;stretching (water aerobics, swimming, personal trainer), Time (Minutes): 60, Frequency (Times/Week): 6, Weekly Exercise (Minutes/Week): 360, Intensity: Mild Healthy diet Good water intake   Goals Addressed             This Visit's Progress    Maintain Healthy Lifestyle       Stay active. Healthy diet.       Depression Screen    07/25/2022    1:46 PM 02/05/2022    4:03 PM 01/17/2022    4:07 PM 11/01/2021   12:05 PM 04/15/2021    8:25 AM 02/15/2020   10:46 AM 09/03/2018    3:18 PM  PHQ 2/9 Scores  PHQ - 2 Score 0 0 0 0 0 0 0  PHQ- 9 Score  0 0   2 1    Fall Risk    07/25/2022    1:46 PM 02/05/2022    4:03 PM 01/17/2022    4:06 PM 11/01/2021   12:05 PM 04/15/2021    8:38 AM  [place] in the past year? 0 0 0 0 0  Number falls in past yr: 0 0 0 0 0  Injury with Fall? 0 0 0 0 0  Risk for fall due to : No Fall Risks No Fall Risks No Fall Risks No Fall Risks   Follow up Falls evaluation completed Falls evaluation completed Falls evaluation completed Falls evaluation completed Falls evaluation completed    [place]: Home free of loose throw rugs in walkways, pet beds, electrical cords, etc? Yes  Adequate lighting in your home to reduce risk of falls? Yes   ASSISTIVE DEVICES UTILIZED TO PREVENT FALLS: Life alert? No  Use of a cane, walker or w/c? No   TIMED UP AND GO: Was the test performed? No .   Cognitive Function:  Patient is alert and oriented x3.  Currently writing a book.  Manages her own finances and medications without issues.  100%  independent.       07/25/2022    1:56 PM  6CIT Screen  Months in reverse 0 points    Immunizations Immunization History  Administered Date(s) Administered   Fluad Quad(high Dose 65+) 07/13/2019, 07/20/2020, 08/07/2021   Influenza, High Dose Seasonal PF 08/25/2017, 09/03/2018   PFIZER Comirnaty(Gray Top)Covid-19 Tri-Sucrose Vaccine 02/16/2021    PFIZER(Purple Top)SARS-COV-2 Vaccination 12/08/2019, 12/30/2019, 08/10/2020   Pfizer Covid-19 Vaccine Bivalent Booster 27yr & up 08/07/2021   Pneumococcal Conjugate-13 04/01/2017   Pneumococcal-Unspecified 10/23/2015   Tdap 03/17/2017   Zoster Recombinat (Shingrix) 04/03/2017, 06/03/2017   Flu Vaccine status: Due, Education has been provided regarding the importance of this vaccine. Advised may receive this vaccine at local pharmacy or Health Dept. Aware to provide a copy of the vaccination record if obtained from local pharmacy or Health Dept. Verbalized acceptance and understanding.  Covid-19 vaccine status: Completed vaccines x5.  Screening Tests Health Maintenance  Topic Date Due   COVID-19 Vaccine (6 - Pfizer series) 08/10/2022 (Originally 12/07/2021)   Pneumonia Vaccine 73 Years old (2 - PPSV23 or PCV20) 02/06/2023 (Originally 04/01/2018)   INFLUENZA VACCINE  02/08/2023 (Originally 06/10/2022)   MAMMOGRAM  07/06/2023   TETANUS/TDAP  03/18/2027   COLONOSCOPY (Pts 45-427yrInsurance coverage will need to be confirmed)  11/29/2028   DEXA SCAN  Completed   Hepatitis C Screening  Completed   Zoster Vaccines- Shingrix  Completed   HPV VACCINES  Aged Out   Health Maintenance There are no preventive care reminders to display for this patient.  Lung Cancer Screening: (Low Dose CT Chest recommended if Age 73-80ears, 30 pack-year currently smoking OR have quit w/in 15years.) does not qualify.   Vision Screening: Recommended annual ophthalmology exams for early detection of glaucoma and other disorders of the eye.  Dental Screening: Recommended annual dental exams for proper oral hygiene  Community Resource Referral / Chronic Care Management: CRR required this visit?  No   CCM required this visit?  No      Plan:     I have personally reviewed and noted the following in the patient's chart:   Medical and social history Use of alcohol, tobacco or illicit drugs  Current  medications and supplements including opioid prescriptions. Patient is not currently taking opioid prescriptions. Functional ability and status Nutritional status Physical activity Advanced directives List of other physicians Hospitalizations, surgeries, and ER visits in previous 12 months Vitals Screenings to include cognitive, depression, and falls Referrals and appointments  In addition, I have reviewed and discussed with patient certain preventive protocols, quality metrics, and best practice recommendations. A written personalized care plan for preventive services as well as general preventive health recommendations were provided to patient.     OBVarney BilesLPN   07/16/87/5027

## 2022-07-29 ENCOUNTER — Encounter: Payer: Medicare Other | Admitting: Family

## 2022-08-14 ENCOUNTER — Other Ambulatory Visit: Payer: Self-pay | Admitting: Family

## 2022-08-14 DIAGNOSIS — Z1231 Encounter for screening mammogram for malignant neoplasm of breast: Secondary | ICD-10-CM

## 2022-08-25 ENCOUNTER — Encounter: Payer: Self-pay | Admitting: Family

## 2022-08-25 ENCOUNTER — Ambulatory Visit (INDEPENDENT_AMBULATORY_CARE_PROVIDER_SITE_OTHER): Payer: Medicare Other | Admitting: Family

## 2022-08-25 VITALS — BP 120/70 | HR 63 | Temp 98.2°F | Ht 65.0 in | Wt 159.0 lb

## 2022-08-25 DIAGNOSIS — D721 Eosinophilia, unspecified: Secondary | ICD-10-CM | POA: Diagnosis not present

## 2022-08-25 DIAGNOSIS — E785 Hyperlipidemia, unspecified: Secondary | ICD-10-CM

## 2022-08-25 DIAGNOSIS — Z23 Encounter for immunization: Secondary | ICD-10-CM | POA: Diagnosis not present

## 2022-08-25 DIAGNOSIS — K760 Fatty (change of) liver, not elsewhere classified: Secondary | ICD-10-CM | POA: Diagnosis not present

## 2022-08-25 DIAGNOSIS — Z0000 Encounter for general adult medical examination without abnormal findings: Secondary | ICD-10-CM

## 2022-08-25 DIAGNOSIS — M810 Age-related osteoporosis without current pathological fracture: Secondary | ICD-10-CM | POA: Diagnosis not present

## 2022-08-25 NOTE — Assessment & Plan Note (Signed)
Due for pneumonia PCV 20.  Deferred pelvic exam in the absence of complaints and patient is no longer screening for cervical cancer.  Mammogram is scheduled.  Congratulated patient on diligence to exercise

## 2022-08-25 NOTE — Patient Instructions (Signed)
Nice to see you!  Health Maintenance for Postmenopausal Women Menopause is a normal process in which your ability to get pregnant comes to an end. This process happens slowly over many months or years, usually between the ages of 53 and 42. Menopause is complete when you have missed your menstrual period for 12 months. It is important to talk with your health care provider about some of the most common conditions that affect women after menopause (postmenopausal women). These include heart disease, cancer, and bone loss (osteoporosis). Adopting a healthy lifestyle and getting preventive care can help to promote your health and wellness. The actions you take can also lower your chances of developing some of these common conditions. What are the signs and symptoms of menopause? During menopause, you may have the following symptoms: Hot flashes. These can be moderate or severe. Night sweats. Decrease in sex drive. Mood swings. Headaches. Tiredness (fatigue). Irritability. Memory problems. Problems falling asleep or staying asleep. Talk with your health care provider about treatment options for your symptoms. Do I need hormone replacement therapy? Hormone replacement therapy is effective in treating symptoms that are caused by menopause, such as hot flashes and night sweats. Hormone replacement carries certain risks, especially as you become older. If you are thinking about using estrogen or estrogen with progestin, discuss the benefits and risks with your health care provider. How can I reduce my risk for heart disease and stroke? The risk of heart disease, heart attack, and stroke increases as you age. One of the causes may be a change in the body's hormones during menopause. This can affect how your body uses dietary fats, triglycerides, and cholesterol. Heart attack and stroke are medical emergencies. There are many things that you can do to help prevent heart disease and stroke. Watch your  blood pressure High blood pressure causes heart disease and increases the risk of stroke. This is more likely to develop in people who have high blood pressure readings or are overweight. Have your blood pressure checked: Every 3-5 years if you are 59-47 years of age. Every year if you are 45 years old or older. Eat a healthy diet  Eat a diet that includes plenty of vegetables, fruits, low-fat dairy products, and lean protein. Do not eat a lot of foods that are high in solid fats, added sugars, or sodium. Get regular exercise Get regular exercise. This is one of the most important things you can do for your health. Most adults should: Try to exercise for at least 150 minutes each week. The exercise should increase your heart rate and make you sweat (moderate-intensity exercise). Try to do strengthening exercises at least twice each week. Do these in addition to the moderate-intensity exercise. Spend less time sitting. Even light physical activity can be beneficial. Other tips Work with your health care provider to achieve or maintain a healthy weight. Do not use any products that contain nicotine or tobacco. These products include cigarettes, chewing tobacco, and vaping devices, such as e-cigarettes. If you need help quitting, ask your health care provider. Know your numbers. Ask your health care provider to check your cholesterol and your blood sugar (glucose). Continue to have your blood tested as directed by your health care provider. Do I need screening for cancer? Depending on your health history and family history, you may need to have cancer screenings at different stages of your life. This may include screening for: Breast cancer. Cervical cancer. Lung cancer. Colorectal cancer. What is my risk for osteoporosis?  After menopause, you may be at increased risk for osteoporosis. Osteoporosis is a condition in which bone destruction happens more quickly than new bone creation. To help  prevent osteoporosis or the bone fractures that can happen because of osteoporosis, you may take the following actions: If you are 7-50 years old, get at least 1,000 mg of calcium and at least 600 international units (IU) of vitamin D per day. If you are older than age 75 but younger than age 75, get at least 1,200 mg of calcium and at least 600 international units (IU) of vitamin D per day. If you are older than age 8, get at least 1,200 mg of calcium and at least 800 international units (IU) of vitamin D per day. Smoking and drinking excessive alcohol increase the risk of osteoporosis. Eat foods that are rich in calcium and vitamin D, and do weight-bearing exercises several times each week as directed by your health care provider. How does menopause affect my mental health? Depression may occur at any age, but it is more common as you become older. Common symptoms of depression include: Feeling depressed. Changes in sleep patterns. Changes in appetite or eating patterns. Feeling an overall lack of motivation or enjoyment of activities that you previously enjoyed. Frequent crying spells. Talk with your health care provider if you think that you are experiencing any of these symptoms. General instructions See your health care provider for regular wellness exams and vaccines. This may include: Scheduling regular health, dental, and eye exams. Getting and maintaining your vaccines. These include: Influenza vaccine. Get this vaccine each year before the flu season begins. Pneumonia vaccine. Shingles vaccine. Tetanus, diphtheria, and pertussis (Tdap) booster vaccine. Your health care provider may also recommend other immunizations. Tell your health care provider if you have ever been abused or do not feel safe at home. Summary Menopause is a normal process in which your ability to get pregnant comes to an end. This condition causes hot flashes, night sweats, decreased interest in sex, mood  swings, headaches, or lack of sleep. Treatment for this condition may include hormone replacement therapy. Take actions to keep yourself healthy, including exercising regularly, eating a healthy diet, watching your weight, and checking your blood pressure and blood sugar levels. Get screened for cancer and depression. Make sure that you are up to date with all your vaccines. This information is not intended to replace advice given to you by your health care provider. Make sure you discuss any questions you have with your health care provider. Document Revised: 03/18/2021 Document Reviewed: 03/18/2021 Elsevier Patient Education  [place].

## 2022-08-25 NOTE — Progress Notes (Signed)
Subjective:    Patient ID: Brittany Farley, female    DOB: December 01, 1950, 71 y.o.   MRN: [id number]  CC: Brittany Farley is a 71 y.o. female who presents today for physical exam.    HPI: Feels well today No complaints     Colorectal Cancer Screening: UTD 11/29/18 , Dr Bonna Gains, polyps   Breast Cancer Screening: Mammogram scheduled Cervical Cancer Screening: History of hysterectomy for noncancerous reason. Repeat in 10 years  Bone Health screening/DEXA for 65+: UTD 12/11/21   Lung Cancer Screening: Doesn't have 20 year pack year history and age > 51 years yo 79 years        Tetanus - UTD Labs: Screening labs today. Exercise: Gets regular exercise with swimming, biking. Denies CP.  Alcohol use:  occassional Smoking/tobacco use: Nonsmoker.     HISTORY:  Past Medical History:  Diagnosis Date   Arthritis    right hand   Osteoporosis     Past Surgical History:  Procedure Laterality Date   ABDOMINAL HYSTERECTOMY  1980   had hysterectomy for fibroids. No cancer. Has both ovaries.    BARIATRIC SURGERY     late 60's   CATARACT EXTRACTION W/PHACO Right 02/20/2020   Procedure: CATARACT EXTRACTION PHACO AND INTRAOCULAR LENS PLACEMENT (IOC) RIGHT 2.33  00:24.3;  Surgeon: Eulogio Bear, MD;  Location: [place];  Service: Ophthalmology;  Laterality: Right;   CATARACT EXTRACTION W/PHACO Left 04/02/2020   Procedure: CATARACT EXTRACTION PHACO AND INTRAOCULAR LENS PLACEMENT ([place]) LEFT;  Surgeon: Eulogio Bear, MD;  Location: [place];  Service: Ophthalmology;  Laterality: Left;  1.68 0:28.8   CHOLECYSTECTOMY  2011   COLONOSCOPY WITH PROPOFOL N/A 11/29/2018   Procedure: COLONOSCOPY WITH PROPOFOL;  Surgeon: Virgel Manifold, MD;  Location: [facility];  Service: Endoscopy;  Laterality: N/A;   OOPHORECTOMY     TUMOR EXCISION N/A 11/28/2021   Procedure: TRANSRECTAL TUMOR EXCISION, polyp;  Surgeon: Jules Husbands, MD;  Location: [facility];  Service: General;   Laterality: N/A;   Family History  Problem Relation Age of Onset   Breast cancer Mother 34   Breast cancer Maternal Aunt 54   Lung cancer Sister    Diabetes Sister    Prostate cancer Brother    Heart disease Brother    Stroke Cousin    Hypertension Cousin       ALLERGIES: Patient has no known allergies.  Current Outpatient Medications on File Prior to Visit  Medication Sig Dispense Refill   alendronate (FOSAMAX) 70 MG tablet TAKE 1 TABLET BY MOUTH  WEEKLY WITH 8 OZ OF PLAIN  WATER 30 MINUTES BEFORE  FIRST FOOD, DRINK OR MEDS.  STAY UPRIGHT FOR 30 MINS 12 tablet 3   aspirin EC 81 MG tablet Take 81 mg by mouth daily.     atorvastatin (LIPITOR) 10 MG tablet TAKE 1 TABLET BY MOUTH  DAILY 90 tablet 3   Calcium Carbonate-Vit D-Min (CALCIUM 1200 PO) Take by mouth in the morning and at bedtime.     Cholecalciferol (VITAMIN D3) 2000 units TABS Take by mouth.     meloxicam (MOBIC) 7.5 MG tablet TAKE 1 TABLET BY MOUTH DAILY AS  NEEDED FOR PAIN 60 tablet 5   Multiple Vitamin (MULTIVITAMIN ADULT PO) Take by mouth.     No current facility-administered medications on file prior to visit.    Social History   Tobacco Use   Smoking status: Never   Smokeless tobacco: Never  Vaping Use   Vaping Use:  Never used  Substance Use Topics   Alcohol use: Not Currently    Comment: occasional - 1-2 drinks/year   Drug use: Never    Review of Systems  Constitutional:  Negative for chills, fever and unexpected weight change.  HENT:  Negative for congestion.   Respiratory:  Negative for cough.   Cardiovascular:  Negative for chest pain, palpitations and leg swelling.  Gastrointestinal:  Negative for nausea and vomiting.  Musculoskeletal:  Negative for arthralgias and myalgias.  Skin:  Negative for rash.  Neurological:  Negative for headaches.  Hematological:  Negative for adenopathy.  Psychiatric/Behavioral:  Negative for confusion.       Objective:    BP 120/70 (BP Location: Left Arm,  Patient Position: Sitting, Cuff Size: Large)   Pulse 63   Temp 98.2 F (36.8 C) (Oral)   Ht '5\' 5"'$  (1.651 m)   Wt 159 lb (72.1 kg)   SpO2 98%   BMI 26.46 kg/m   BP Readings from Last 3 Encounters:  08/25/22 120/70  12/16/21 (!) 144/83  12/06/21 138/82   Wt Readings from Last 3 Encounters:  08/25/22 159 lb (72.1 kg)  07/25/22 155 lb (70.3 kg)  01/17/22 155 lb 3.2 oz (70.4 kg)    Physical Exam Vitals reviewed.  Constitutional:      Appearance: Normal appearance. She is well-developed.  Eyes:     Conjunctiva/sclera: Conjunctivae normal.  Neck:     Thyroid: No thyroid mass or thyromegaly.  Cardiovascular:     Rate and Rhythm: Normal rate and regular rhythm.     Pulses: Normal pulses.     Heart sounds: Normal heart sounds.  Pulmonary:     Effort: Pulmonary effort is normal.     Breath sounds: Normal breath sounds. No wheezing, rhonchi or rales.  Chest:  Breasts:    Breasts are symmetrical.     Right: No inverted nipple, mass, nipple discharge, skin change or tenderness.     Left: No inverted nipple, mass, nipple discharge, skin change or tenderness.  Abdominal:     General: Bowel sounds are normal. There is no distension.     Palpations: Abdomen is soft. Abdomen is not rigid. There is no fluid wave or mass.     Tenderness: There is no abdominal tenderness. There is no guarding or rebound.  Lymphadenopathy:     Head:     Right side of head: No submental, submandibular, tonsillar, preauricular, posterior auricular or occipital adenopathy.     Left side of head: No submental, submandibular, tonsillar, preauricular, posterior auricular or occipital adenopathy.     Cervical: No cervical adenopathy.     Right cervical: No superficial, deep or posterior cervical adenopathy.    Left cervical: No superficial, deep or posterior cervical adenopathy.  Skin:    General: Skin is warm and dry.  Neurological:     Mental Status: She is alert.  Psychiatric:        Speech: Speech  normal.        Behavior: Behavior normal.        Thought Content: Thought content normal.        Assessment & Plan:   Problem List Items Addressed This Visit       Digestive   Hepatic steatosis   Relevant Orders   Comprehensive metabolic panel     Musculoskeletal and Integument   Osteoporosis   Relevant Orders   VITAMIN D 25 Hydroxy (Vit-D Deficiency, Fractures)     Other   Eosinophilia  Relevant Orders   CBC with Differential/Platelet   Hyperlipidemia - Primary   Relevant Orders   Lipid panel   Routine physical examination    Due for pneumonia PCV 20.  Deferred pelvic exam in the absence of complaints and patient is no longer screening for cervical cancer.  Mammogram is scheduled.  Congratulated patient on diligence to exercise      Relevant Orders   Hemoglobin A1c   TSH   Other Visit Diagnoses     Need for immunization against influenza       Relevant Orders   Flu Vaccine QUAD High Dose(Fluad) (Completed)        I am having [name] maintain her Vitamin D3, aspirin EC, Multiple Vitamin (MULTIVITAMIN ADULT PO), Calcium Carbonate-Vit D-Min (CALCIUM 1200 PO), atorvastatin, alendronate, and meloxicam.   No orders of the defined types were placed in this encounter.   Return precautions given.   Risks, benefits, and alternatives of the medications and treatment plan prescribed today were discussed, and patient expressed understanding.   Education regarding symptom management and diagnosis given to patient on AVS.   Continue to follow with Burnard Hawthorne, FNP for routine health maintenance.   [name] and I agreed with plan.   [name], FNP

## 2022-09-03 ENCOUNTER — Ambulatory Visit (INDEPENDENT_AMBULATORY_CARE_PROVIDER_SITE_OTHER): Payer: Medicare Other

## 2022-09-03 DIAGNOSIS — Z0000 Encounter for general adult medical examination without abnormal findings: Secondary | ICD-10-CM | POA: Diagnosis not present

## 2022-09-03 DIAGNOSIS — M810 Age-related osteoporosis without current pathological fracture: Secondary | ICD-10-CM

## 2022-09-03 DIAGNOSIS — D721 Eosinophilia, unspecified: Secondary | ICD-10-CM | POA: Diagnosis not present

## 2022-09-03 DIAGNOSIS — E785 Hyperlipidemia, unspecified: Secondary | ICD-10-CM

## 2022-09-03 DIAGNOSIS — K760 Fatty (change of) liver, not elsewhere classified: Secondary | ICD-10-CM | POA: Diagnosis not present

## 2022-09-03 DIAGNOSIS — Z23 Encounter for immunization: Secondary | ICD-10-CM

## 2022-09-03 LAB — TSH: TSH: 2.65 u[IU]/mL (ref 0.35–5.50)

## 2022-09-03 LAB — COMPREHENSIVE METABOLIC PANEL
ALT: 27 U/L (ref 0–35)
AST: 34 U/L (ref 0–37)
Albumin: 3.7 g/dL (ref 3.5–5.2)
Alkaline Phosphatase: 76 U/L (ref 39–117)
BUN: 19 mg/dL (ref 6–23)
CO2: 28 mEq/L (ref 19–32)
Calcium: 8.9 mg/dL (ref 8.4–10.5)
Chloride: 106 mEq/L (ref 96–112)
Creatinine, Ser: 0.64 mg/dL (ref 0.40–1.20)
GFR: 87.85 mL/min (ref >60.00)
Glucose, Bld: 96 mg/dL (ref 70–99)
Potassium: 4 mEq/L (ref 3.5–5.1)
Sodium: 139 mEq/L (ref 135–145)
Total Bilirubin: 0.5 mg/dL (ref 0.2–1.2)
Total Protein: 6.3 g/dL (ref 6.0–8.3)

## 2022-09-03 LAB — LIPID PANEL
Cholesterol: 134 mg/dL (ref 0–200)
HDL: 62.8 mg/dL (ref >39.00)
LDL Cholesterol: 61 mg/dL (ref 0–99)
NonHDL: 70.77
Total CHOL/HDL Ratio: 2
Triglycerides: 47 mg/dL (ref 0.0–149.0)
VLDL: 9.4 mg/dL (ref 0.0–40.0)

## 2022-09-03 LAB — CBC WITH DIFFERENTIAL/PLATELET
Basophils Absolute: 0 10*3/uL (ref 0.0–0.1)
Basophils Relative: 0.8 % (ref 0.0–3.0)
Eosinophils Absolute: 0.4 10*3/uL (ref 0.0–0.7)
Eosinophils Relative: 7.4 % — ABNORMAL HIGH (ref 0.0–5.0)
HCT: 39.2 % (ref 36.0–46.0)
Hemoglobin: 12.8 g/dL (ref 12.0–15.0)
Lymphocytes Relative: 34.6 % (ref 12.0–46.0)
Lymphs Abs: 1.8 10*3/uL (ref 0.7–4.0)
MCHC: 32.7 g/dL (ref 30.0–36.0)
MCV: 78.3 fl (ref 78.0–100.0)
Monocytes Absolute: 0.4 10*3/uL (ref 0.1–1.0)
Monocytes Relative: 6.7 % (ref 3.0–12.0)
Neutro Abs: 2.7 10*3/uL (ref 1.4–7.7)
Neutrophils Relative %: 50.5 % (ref 43.0–77.0)
Platelets: 292 10*3/uL (ref 150.0–400.0)
RBC: 5 Mil/uL (ref 3.87–5.11)
RDW: 14.3 % (ref 11.5–15.5)
WBC: 5.3 10*3/uL (ref 4.0–10.5)

## 2022-09-03 LAB — HEMOGLOBIN A1C: Hgb A1c MFr Bld: 6 % (ref 4.6–6.5)

## 2022-09-03 LAB — VITAMIN D 25 HYDROXY (VIT D DEFICIENCY, FRACTURES): VITD: 56.69 ng/mL (ref 30.00–100.00)

## 2022-09-12 ENCOUNTER — Ambulatory Visit
Admission: RE | Admit: 2022-09-12 | Discharge: 2022-09-12 | Disposition: A | Discharge status: Home / Self Care | Payer: Medicare Other | Source: Ambulatory Visit | Attending: Family | Admitting: Family

## 2022-09-12 DIAGNOSIS — Z1231 Encounter for screening mammogram for malignant neoplasm of breast: Secondary | ICD-10-CM | POA: Insufficient documentation

## 2022-10-05 ENCOUNTER — Other Ambulatory Visit: Payer: Self-pay | Admitting: Family

## 2022-10-05 DIAGNOSIS — E785 Hyperlipidemia, unspecified: Secondary | ICD-10-CM

## 2022-10-10 DIAGNOSIS — M1611 Unilateral primary osteoarthritis, right hip: Secondary | ICD-10-CM | POA: Insufficient documentation

## 2022-10-13 ENCOUNTER — Telehealth: Payer: Self-pay | Admitting: Cardiovascular Disease

## 2022-10-13 NOTE — Telephone Encounter (Signed)
   Pre-operative Risk Assessment    Patient Name: Brittany Farley  DOB: 05/15/1951 MRN: [id number]{      Request for Surgical Clearance    Procedure:   LT THA ANTERIOR HIP  Date of Surgery:  Clearance 10/30/22                               Surgeon:  DR Kurtis Bushman Surgeon's Group or Practice Name:  [facility] Phone number:  [phone] Fax number:  [phone]  Type of Clearance Requested:   - Pharmacy:  Hold Aspirin 81 MG   Type of Anesthesia:  Spinal   Additional requests/questions:    Signed, Eli Phillips   10/13/2022, 11:12 AM

## 2022-10-13 NOTE — Telephone Encounter (Signed)
   Name: Brittany Farley  DOB: Oct 16, 1951  MRN: [id number]  Primary Cardiologist: None  Chart reviewed as part of pre-operative protocol coverage. Because of [name] past medical history and time since last visit, she will require a follow-up in-office visit in order to better assess preoperative cardiovascular risk.   Last appointment was in 2018 with Dr. Fletcher Anon, therefore needs new patient appointment with MD.  Pre-op covering staff: - Please schedule appointment and call patient to inform them. - Please contact requesting surgeon's office via preferred method (i.e, phone, fax) to inform them of need for appointment prior to surgery.  Preliminarily do not see contraindication to holding ASA based on hx in 2018 but will need updated assessment at time of visit with MD they get scheduled with - please weigh in on ASA at time of visit. ThxCharlie Pitter, PA-C  10/13/2022, 1:03 PM

## 2022-10-14 NOTE — Telephone Encounter (Signed)
Left message on machine for pt to contact the office.   

## 2022-10-15 NOTE — Telephone Encounter (Signed)
Pt has new pt appt 10/21/22 with Dr. Ellyn Hack, for pre op clearance. Once Dr. Ellyn Hack has cleared the pt he will have his nurse/cma fax over his ov note giving clearance and any medication recommendations.

## 2022-10-20 ENCOUNTER — Encounter: Payer: Self-pay | Admitting: Family

## 2022-10-20 ENCOUNTER — Ambulatory Visit: Payer: Medicare Other | Admitting: Family

## 2022-10-20 ENCOUNTER — Ambulatory Visit (INDEPENDENT_AMBULATORY_CARE_PROVIDER_SITE_OTHER): Payer: Medicare Other | Admitting: Family

## 2022-10-20 VITALS — BP 124/78 | HR 63 | Temp 97.6°F | Wt 160.8 lb

## 2022-10-20 DIAGNOSIS — M25552 Pain in left hip: Secondary | ICD-10-CM | POA: Diagnosis not present

## 2022-10-20 DIAGNOSIS — M25551 Pain in right hip: Secondary | ICD-10-CM | POA: Diagnosis not present

## 2022-10-20 DIAGNOSIS — E785 Hyperlipidemia, unspecified: Secondary | ICD-10-CM

## 2022-10-20 LAB — PROTIME-INR
INR: 1 ratio (ref 0.8–1.0)
Prothrombin Time: 11.3 s (ref 9.6–13.1)

## 2022-10-20 LAB — CBC WITH DIFFERENTIAL/PLATELET
Basophils Absolute: 0 10*3/uL (ref 0.0–0.1)
Basophils Relative: 0.2 % (ref 0.0–3.0)
Eosinophils Absolute: 0.6 10*3/uL (ref 0.0–0.7)
Eosinophils Relative: 12.2 % — ABNORMAL HIGH (ref 0.0–5.0)
HCT: 43.1 % (ref 36.0–46.0)
Hemoglobin: 14 g/dL (ref 12.0–15.0)
Lymphocytes Relative: 35.3 % (ref 12.0–46.0)
Lymphs Abs: 1.7 10*3/uL (ref 0.7–4.0)
MCHC: 32.6 g/dL (ref 30.0–36.0)
MCV: 77.7 fl — ABNORMAL LOW (ref 78.0–100.0)
Monocytes Absolute: 0.4 10*3/uL (ref 0.1–1.0)
Monocytes Relative: 7.3 % (ref 3.0–12.0)
Neutro Abs: 2.2 10*3/uL (ref 1.4–7.7)
Neutrophils Relative %: 45 % (ref 43.0–77.0)
Platelets: 324 10*3/uL (ref 150.0–400.0)
RBC: 5.55 Mil/uL — ABNORMAL HIGH (ref 3.87–5.11)
RDW: 14.4 % (ref 11.5–15.5)
WBC: 5 10*3/uL (ref 4.0–10.5)

## 2022-10-20 LAB — COMPREHENSIVE METABOLIC PANEL
ALT: 33 U/L (ref 0–35)
AST: 37 U/L (ref 0–37)
Albumin: 4.1 g/dL (ref 3.5–5.2)
Alkaline Phosphatase: 88 U/L (ref 39–117)
BUN: 21 mg/dL (ref 6–23)
CO2: 31 mEq/L (ref 19–32)
Calcium: 9.5 mg/dL (ref 8.4–10.5)
Chloride: 104 mEq/L (ref 96–112)
Creatinine, Ser: 0.7 mg/dL (ref 0.40–1.20)
GFR: 85.9 mL/min (ref >60.00)
Glucose, Bld: 101 mg/dL — ABNORMAL HIGH (ref 70–99)
Potassium: 4.5 mEq/L (ref 3.5–5.1)
Sodium: 140 mEq/L (ref 135–145)
Total Bilirubin: 0.3 mg/dL (ref 0.2–1.2)
Total Protein: 6.9 g/dL (ref 6.0–8.3)

## 2022-10-20 NOTE — Progress Notes (Signed)
Subjective:    Patient ID: Brittany Farley, female    DOB: [birth year], 71 y.o.   MRN: [id number]  CC: Brittany Farley is a 71 y.o. female who presents today for follow up.   HPI: Here today for follow-up.  Feels well.  No new complaints.  She is upcoming surgery with Dr. Harlow Mares.  He has cardiac evaluation with Dr Ellyn Hack tomorrow for preop clearance.   Denies chest pain, shortness of breath.  She continues to exercise regularly     Planning Left hip replacement 10/30/22 Status post right total hip arthroplasty     HISTORY:  Past Medical History:  Diagnosis Date   Arthritis    right hand   Osteoporosis    Past Surgical History:  Procedure Laterality Date   ABDOMINAL HYSTERECTOMY  1980   had hysterectomy for fibroids. No cancer. Has both ovaries.    BARIATRIC SURGERY     late 60's   CATARACT EXTRACTION W/PHACO Right 02/20/2020   Procedure: CATARACT EXTRACTION PHACO AND INTRAOCULAR LENS PLACEMENT ([facility]) RIGHT 2.33  00:24.3;  Surgeon: Brittany Bear, MD;  Location: [place];  Service: Ophthalmology;  Laterality: Right;   CATARACT EXTRACTION W/PHACO Left 04/02/2020   Procedure: CATARACT EXTRACTION PHACO AND INTRAOCULAR LENS PLACEMENT ([place]) LEFT;  Surgeon: Brittany Bear, MD;  Location: [place];  Service: Ophthalmology;  Laterality: Left;  1.68 0:28.8   CHOLECYSTECTOMY  2011   COLONOSCOPY WITH PROPOFOL N/A 11/29/2018   Procedure: COLONOSCOPY WITH PROPOFOL;  Surgeon: Brittany Manifold, MD;  Location: [facility];  Service: Endoscopy;  Laterality: N/A;   OOPHORECTOMY     right total hip arthroplasty Right    Dr Harlow Mares 05/2022   TUMOR EXCISION N/A 11/28/2021   Procedure: TRANSRECTAL TUMOR EXCISION, polyp;  Surgeon: Brittany Husbands, MD;  Location: [facility];  Service: General;  Laterality: N/A;   Family History  Problem Relation Age of Onset   Breast cancer Mother 28   Breast cancer Maternal Aunt 14   Lung cancer Sister    Diabetes Sister     Prostate cancer Brother    Heart disease Brother    Stroke Cousin    Hypertension Cousin     Allergies: Patient has no known allergies. Current Outpatient Medications on File Prior to Visit  Medication Sig Dispense Refill   traMADol (ULTRAM) 50 MG tablet Take by mouth every 6 (six) hours as needed. PT TAKES I TABLET DAILY EVERY 6 HOURS AS NEEDED     alendronate (FOSAMAX) 70 MG tablet TAKE 1 TABLET BY MOUTH  WEEKLY WITH 8 OZ OF PLAIN  WATER 30 MINUTES BEFORE  FIRST FOOD, DRINK OR MEDS.  STAY UPRIGHT FOR 30 MINS 12 tablet 3   aspirin EC 81 MG tablet Take 81 mg by mouth daily.     atorvastatin (LIPITOR) 10 MG tablet TAKE 1 TABLET BY MOUTH DAILY 100 tablet 2   Calcium Carbonate-Vit D-Min (CALCIUM 1200 PO) Take by mouth in the morning and at bedtime.     Cholecalciferol (VITAMIN D3) 2000 units TABS Take by mouth.     meloxicam (MOBIC) 7.5 MG tablet TAKE 1 TABLET BY MOUTH DAILY AS  NEEDED FOR PAIN 60 tablet 5   Multiple Vitamin (MULTIVITAMIN ADULT PO) Take by mouth.     No current facility-administered medications on file prior to visit.    Social History   Tobacco Use   Smoking status: Never   Smokeless tobacco: Never  Vaping Use   Vaping Use: Never used  Substance Use Topics   Alcohol use: Not Currently    Comment: occasional - 1-2 drinks/year   Drug use: Never    Review of Systems  Constitutional:  Negative for chills and fever.  Respiratory:  Negative for cough.   Cardiovascular:  Negative for chest pain and palpitations.  Gastrointestinal:  Negative for nausea and vomiting.      Objective:    BP 124/78   Pulse 63   Temp 97.6 F (36.4 C) (Oral)   Wt 160 lb 12.8 oz (72.9 kg)   SpO2 98%   BMI 26.76 kg/m  BP Readings from Last 3 Encounters:  10/20/22 124/78  08/25/22 120/70  12/16/21 (!) 144/83   Wt Readings from Last 3 Encounters:  10/20/22 160 lb 12.8 oz (72.9 kg)  08/25/22 159 lb (72.1 kg)  07/25/22 155 lb (70.3 kg)    Physical Exam Vitals reviewed.   Constitutional:      Appearance: She is well-developed.  Eyes:     Conjunctiva/sclera: Conjunctivae normal.  Cardiovascular:     Rate and Rhythm: Normal rate and regular rhythm.     Pulses: Normal pulses.     Heart sounds: Normal heart sounds.  Pulmonary:     Effort: Pulmonary effort is normal.     Breath sounds: Normal breath sounds. No wheezing, rhonchi or rales.  Skin:    General: Skin is warm and dry.  Neurological:     Mental Status: She is alert.  Psychiatric:        Speech: Speech normal.        Behavior: Behavior normal.        Thought Content: Thought content normal.        Assessment & Plan:   Problem List Items Addressed This Visit       Other   Bilateral hip pain - Primary    Status post right total hip arthroplasty.  Ordered labs to ensure stable for surgery on 10/30/22.  She has preop cardiology appointment tomorrow.       Relevant Orders   CBC with Differential/Platelet   Comprehensive metabolic panel   Protime-INR   Hyperlipidemia     I am having Brittany Farley maintain her Vitamin D3, aspirin EC, Multiple Vitamin (MULTIVITAMIN ADULT PO), Calcium Carbonate-Vit D-Min (CALCIUM 1200 PO), alendronate, meloxicam, atorvastatin, and traMADol.   No orders of the defined types were placed in this encounter.   Return precautions given.   Risks, benefits, and alternatives of the medications and treatment plan prescribed today were discussed, and patient expressed understanding.   Education regarding symptom management and diagnosis given to patient on AVS.  Continue to follow with Burnard Hawthorne, FNP for routine health maintenance.   Brittany Farley and I agreed with plan.   Brittany Paris, FNP

## 2022-10-20 NOTE — Assessment & Plan Note (Signed)
Status post right total hip arthroplasty.  Ordered labs to ensure stable for surgery on 10/30/22.  She has preop cardiology appointment tomorrow.

## 2022-10-21 ENCOUNTER — Ambulatory Visit: Payer: Medicare Other | Attending: Cardiology | Admitting: Cardiology

## 2022-10-21 ENCOUNTER — Encounter: Payer: Self-pay | Admitting: Cardiology

## 2022-10-21 VITALS — BP 136/80 | HR 64 | Ht 65.0 in | Wt 163.2 lb

## 2022-10-21 DIAGNOSIS — M25552 Pain in left hip: Secondary | ICD-10-CM

## 2022-10-21 DIAGNOSIS — E785 Hyperlipidemia, unspecified: Secondary | ICD-10-CM

## 2022-10-21 DIAGNOSIS — M25551 Pain in right hip: Secondary | ICD-10-CM

## 2022-10-21 DIAGNOSIS — Z01810 Encounter for preprocedural cardiovascular examination: Secondary | ICD-10-CM

## 2022-10-21 NOTE — Patient Instructions (Addendum)
Medication Instructions:  No changes   *If you need a refill on your cardiac medications before your next appointment, please call your pharmacy*   Lab Work: Not needed    Testing/Procedures:  Not needed  Follow-Up: At [facility], you and your health needs are our priority.  As part of our continuing mission to provide you with exceptional heart care, we have created designated Provider Care Teams.  These Care Teams include your primary Cardiologist (physician) and Advanced Practice Providers (APPs -  Physician Assistants and Nurse Practitioners) who all work together to provide you with the care you need, when you need it.     Your next appointment:   As needed     The format for your next appointment:   In Person  Provider:   Glenetta Hew, MD    Other Instructions    Okay from a cardiac standpoint to have left hip surgery with Dr Harlow Mares

## 2022-10-21 NOTE — Progress Notes (Deleted)
Primary Care Provider: Burnard Hawthorne, [place] Cardiologist: None Electrophysiologist: None  Clinic Note: No chief complaint on file.   ===================================  ASSESSMENT/PLAN   Problem List Items Addressed This Visit   None   ===================================  HPI:    Brittany Farley is a 71 y.o. female who is being seen today for the evaluation of *** at the request of Arnett, Yvetta Coder, FNP.  Brittany Farley was last seen on ***  Recent Hospitalizations: ***  Reviewed  CV studies:    The following studies were reviewed today: (if available, images/films reviewed: From Epic Chart or Care Everywhere) TM Echo - 2018: Echo 2018    {roscv:[id number]}  REVIEWED OF SYSTEMS   ROS  I have reviewed and (if needed) personally updated the patient's problem list, medications, allergies, past medical and surgical history, social and family history.   PAST MEDICAL HISTORY   Past Medical History:  Diagnosis Date   Arthritis    right hand   Osteoporosis     PAST SURGICAL HISTORY   Past Surgical History:  Procedure Laterality Date   ABDOMINAL HYSTERECTOMY  1980   had hysterectomy for fibroids. No cancer. Has both ovaries.    BARIATRIC SURGERY     late 60's   CATARACT EXTRACTION W/PHACO Right 02/20/2020   Procedure: CATARACT EXTRACTION PHACO AND INTRAOCULAR LENS PLACEMENT (IOC) RIGHT 2.33  00:24.3;  Surgeon: Eulogio Bear, MD;  Location: [place];  Service: Ophthalmology;  Laterality: Right;   CATARACT EXTRACTION W/PHACO Left 04/02/2020   Procedure: CATARACT EXTRACTION PHACO AND INTRAOCULAR LENS PLACEMENT ([place]) LEFT;  Surgeon: Eulogio Bear, MD;  Location: [place];  Service: Ophthalmology;  Laterality: Left;  1.68 0:28.8   CHOLECYSTECTOMY  2011   COLONOSCOPY WITH PROPOFOL N/A 11/29/2018   Procedure: COLONOSCOPY WITH PROPOFOL;  Surgeon: Virgel Manifold, MD;  Location: [facility];  Service:  Endoscopy;  Laterality: N/A;   OOPHORECTOMY     right total hip arthroplasty Right    Dr Harlow Mares 05/2022   TUMOR EXCISION N/A 11/28/2021   Procedure: TRANSRECTAL TUMOR EXCISION, polyp;  Surgeon: Jules Husbands, MD;  Location: [facility];  Service: General;  Laterality: N/A;    Immunization History  Administered Date(s) Administered   Fluad Quad(high Dose 65+) 07/13/2019, 07/20/2020, 08/07/2021, 08/25/2022   Influenza, High Dose Seasonal PF 08/25/2017, 09/03/2018   PFIZER Comirnaty(Gray Top)Covid-19 Tri-Sucrose Vaccine 02/16/2021   PFIZER(Purple Top)SARS-COV-2 Vaccination 12/08/2019, 12/30/2019, 08/10/2020   PNEUMOCOCCAL CONJUGATE-20 09/03/2022   Pfizer Covid-19 Vaccine Bivalent Booster 37yr & up 08/07/2021   Pneumococcal Conjugate-13 04/01/2017   Pneumococcal-Unspecified 10/23/2015   Tdap 03/17/2017   Zoster Recombinat (Shingrix) 04/03/2017, 06/03/2017    MEDICATIONS/ALLERGIES   Current Meds  Medication Sig   alendronate (FOSAMAX) 70 MG tablet TAKE 1 TABLET BY MOUTH  WEEKLY WITH 8 OZ OF PLAIN  WATER 30 MINUTES BEFORE  FIRST FOOD, DRINK OR MEDS.  STAY UPRIGHT FOR 30 MINS   aspirin EC 81 MG tablet Take 81 mg by mouth daily.   atorvastatin (LIPITOR) 10 MG tablet TAKE 1 TABLET BY MOUTH DAILY   Calcium Carbonate-Vit D-Min (CALCIUM 1200 PO) Take by mouth in the morning and at bedtime.   Cholecalciferol (VITAMIN D3) 2000 units TABS Take by mouth.   meloxicam (MOBIC) 7.5 MG tablet TAKE 1 TABLET BY MOUTH DAILY AS  NEEDED FOR PAIN   Multiple Vitamin (MULTIVITAMIN ADULT PO) Take by mouth.   traMADol (ULTRAM) 50 MG tablet Take by mouth every 6 (six) hours  as needed. 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       Family History  Problem Relation Age of Onset   Breast cancer Mother 5   Breast cancer Maternal Aunt 41   Lung cancer Sister    Diabetes Sister    Prostate cancer Brother    Heart disease Brother    Stroke Cousin    Hypertension Cousin     OBJCTIVE -PE, EKG, labs   Wt Readings from Last 3 Encounters:  10/21/22 163 lb 3.2 oz (74 kg)  10/20/22 160 lb 12.8 oz (72.9 kg)  08/25/22 159 lb (72.1 kg)    Physical Exam: BP 136/80   Pulse 64   Ht '5\' 5"'$  (1.651 m)   Wt 163 lb 3.2 oz (74 kg)   SpO2 98%   BMI 27.16 kg/m  Physical Exam   Adult ECG Report  Rate: *** ;  Rhythm: {rhythm:17366};   Narrative Interpretation: ***  Recent Labs:  ***  Lab Results  Component Value Date   CHOL 134 09/03/2022   HDL 62.80 09/03/2022   LDLCALC 61 09/03/2022   TRIG 47.0 09/03/2022   CHOLHDL 2 09/03/2022   Lab Results  Component Value Date   CREATININE 0.70 10/20/2022   BUN 21 10/20/2022   NA 140 10/20/2022   K 4.5 10/20/2022   CL 104 10/20/2022   CO2 31 10/20/2022      Latest Ref Rng & Units 10/20/2022   10:32 AM 09/03/2022   10:06 AM 02/18/2022    2:14 PM  CBC  WBC 4.0 - 10.5 K/uL 5.0  5.3  5.8   Hemoglobin 12.0 - 15.0 g/dL 14.0  12.8  12.8   Hematocrit 36.0 - 46.0 % 43.1  39.2  38.9   Platelets 150.0 - 400.0 K/uL 324.0  292.0  328.0     Lab Results  Component Value Date   HGBA1C 6.0 09/03/2022   Lab Results  Component Value Date   TSH 2.65 09/03/2022    ================================================== I spent a total of *** minutes with the patient spent in direct patient consultation.  Additional time spent with chart review  / charting (studies, outside notes, etc): *** min Total Time: *** min  Current medicines are reviewed at length with the patient today.  (+/-  concerns) ***  Notice: This dictation was prepared with Dragon dictation along with smart phrase technology. Any transcriptional errors that result from this process are unintentional and may not be corrected upon review.   Studies Ordered:  No orders of the defined types were placed in this encounter.  No orders of the defined types were placed in this encounter.   Patient Instructions / Medication Changes & Studies & Tests Ordered   There are no Patient Instructions on file for this visit.    Leonie Man, MD, MS Glenetta Hew, M.D., M.S. Interventional Cardiologist  [employer] #  [phone] Phone # [phone] [address] [place], [place] [address]   Thank you for choosing [facility] [place]!!

## 2022-10-22 ENCOUNTER — Encounter: Payer: Self-pay | Admitting: Cardiology

## 2022-10-22 ENCOUNTER — Telehealth: Payer: Self-pay | Admitting: Family

## 2022-10-22 ENCOUNTER — Other Ambulatory Visit: Payer: Self-pay | Admitting: Family

## 2022-10-22 DIAGNOSIS — D721 Eosinophilia, unspecified: Secondary | ICD-10-CM

## 2022-10-22 DIAGNOSIS — M25551 Pain in right hip: Secondary | ICD-10-CM

## 2022-10-22 DIAGNOSIS — Z01810 Encounter for preprocedural cardiovascular examination: Secondary | ICD-10-CM | POA: Insufficient documentation

## 2022-10-22 NOTE — Assessment & Plan Note (Signed)
Well-controlled on current dose of statin.

## 2022-10-22 NOTE — Assessment & Plan Note (Signed)
Status post right hip arthroplasty.  Has pending left hip arthroplasty.  Tolerated the first surgery well.  I see no reason why she would not tolerate the second surgery well.

## 2022-10-22 NOTE — Telephone Encounter (Signed)
Call patient For some reason we received surgical preop paperwork today. I did not have this in the visit last week.    We largely completed everything with the exception of a urinalysis which is asked to complete.  I ordered.  Please have patient come in tomorrow for repeat urine studies.    Dr. Ellyn Hack appears to provided cardiac clearance.    patient will need to hold aspirin 5 to 7 days prior to hip surgery and she can resume after surgery once bleeding is controlled.

## 2022-10-22 NOTE — Progress Notes (Signed)
Primary Care Provider: Burnard Hawthorne, [place] Cardiologist: Glenetta Hew, MD Electrophysiologist: None  Clinic Note: Chief Complaint  Patient presents with   Pre-op Exam    Was referred for preop assessment for her second hip surgery this calendar year.  Has had right hip, now planning left hip.    ===================================  ASSESSMENT/PLAN   Problem List Items Addressed This Visit       Cardiology Problems   Hyperlipidemia (Chronic)    Well-controlled on current dose of statin.        Other   Preoperative cardiovascular examination    Brittany Farley is extremely healthy.  She has no active cardiac symptoms.  She has borderline hyperlipidemia for which she takes 10 mg of atorvastatin and lipids are well-controlled.  Her blood pressures are controlled without any medications. She does not have any anginal symptoms.  No heart failure symptoms.  She has never had a stroke.  She has normal renal function.  Based on revised cardiac risk index, she would be a low risk patient for low risk surgery.    Preoperative guidelines would suggest that no further evaluation is required in this otherwise healthy patient who was able to achieve well over 8 METS. No echocardiogram or stress test is warranted.  No additional medications are required.  Would recommend proceeding to the OR without any further evaluation.      Bilateral hip pain    Status post right hip arthroplasty.  Has pending left hip arthroplasty.  Tolerated the first surgery well.  I see no reason why she would not tolerate the second surgery well.      Other Visit Diagnoses     Pre-operative cardiovascular examination    -  Primary   Relevant Orders   EKG 12-Lead       ===================================  HPI:    Brittany Farley is a otherwise healthy 71 y.o. female who is being seen today for Preoperative Risk Assessment for Left Hip Arthroplasty at the request of Arnett, Yvetta Coder,  FNP.  Brittany Farley was seen back in May 2018 by Dr. Kathlyn Sacramento for evaluation of atypical chest pain.  This was evaluated with a treadmill stress test and echocardiogram that were both relatively normal.  Recent Hospitalizations:  She had right hip arthroplasty recently, but I am not sure when.  Reviewed  CV studies:    The following studies were reviewed today: (if available, images/films reviewed: From Epic Chart or Care Everywhere) Echocardiogram May 2018: EF 60 to 65%.  Normal wall motion.  Mild LA dilation. ETT 06/18/2017: Exercised 9 minutes.  Achieved 10.1 METS.  88% MPHR.  No ischemic changes on EKG.  No symptoms.--Low risk   Interval History:   Brittany Farley presents here today being told that she is a-year-old worse so she needs to be seen by cardiology for preop assessment for her left hip arthroplasty.  She underwent right hip arthroplasty and had no issues.  She is being seen by Dr. Harlow Mares from [facility].  Brittany Farley is extremely healthy.  She does 3 to 4 hours of pool exercises a day.  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  She then will also walk a mile or 2 to start every day.  She has no complaints of dyspnea.  She is really limited now by her hip but has not even kept her from doing exercise.  She is not having any chest pain or pressure or dyspnea with exertion.  She denies any heart failure symptoms of PND, orthopnea or edema.  No rapid regular heartbeats or palpitations.  No syncope or near syncope.  No TIA or emesis DES.  Essentially asymptomatic regarding standpoint.  She is able achieve well over 8 METS.  She is not diabetic with normal renal function.  No prior stroke.  She takes low-dose atorvastatin for lipids but is otherwise healthy. She seems to have recovered from the right hip without any major issues and is already back doing her exercises.  REVIEWED OF SYSTEMS   Review of Systems  Constitutional:  Negative for malaise/fatigue and  weight loss.  HENT:  Negative for congestion.   Respiratory: Negative.    Gastrointestinal:  Negative for blood in stool and melena.  Genitourinary:  Negative for hematuria.  Musculoskeletal:  Positive for joint pain (Left hip down the [place] her walking.  Right hip doing better).  Neurological:  Negative for dizziness and focal weakness.  Psychiatric/Behavioral:  Negative for depression and memory loss. The patient is not nervous/anxious and does not have insomnia.     I have reviewed and (if needed) personally updated the patient's problem list, medications, allergies, past medical and surgical history, social and family history.   PAST MEDICAL HISTORY   Past Medical History:  Diagnosis Date   Arthritis    right hand   Osteoporosis     PAST SURGICAL HISTORY   Past Surgical History:  Procedure Laterality Date   ABDOMINAL HYSTERECTOMY  1980   had hysterectomy for fibroids. No cancer. Has both ovaries.    BARIATRIC SURGERY     late 60's   CATARACT EXTRACTION W/PHACO Right 02/20/2020   Procedure: CATARACT EXTRACTION PHACO AND INTRAOCULAR LENS PLACEMENT (IOC) RIGHT 2.33  00:24.3;  Surgeon: Eulogio Bear, MD;  Location: [place];  Service: Ophthalmology;  Laterality: Right;   CATARACT EXTRACTION W/PHACO Left 04/02/2020   Procedure: CATARACT EXTRACTION PHACO AND INTRAOCULAR LENS PLACEMENT ([place]) LEFT;  Surgeon: Eulogio Bear, MD;  Location: [place];  Service: Ophthalmology;  Laterality: Left;  1.68 0:28.8   CHOLECYSTECTOMY  2011   COLONOSCOPY WITH PROPOFOL N/A 11/29/2018   Procedure: COLONOSCOPY WITH PROPOFOL;  Surgeon: Virgel Manifold, MD;  Location: [facility];  Service: Endoscopy;  Laterality: N/A;   OOPHORECTOMY     right total hip arthroplasty Right    Dr Harlow Mares 05/2022   TUMOR EXCISION N/A 11/28/2021   Procedure: TRANSRECTAL TUMOR EXCISION, polyp;  Surgeon: Jules Husbands, MD;  Location: [facility];  Service: General;  Laterality: N/A;     Immunization History  Administered Date(s) Administered   Fluad Quad(high Dose 65+) 07/13/2019, 07/20/2020, 08/07/2021, 08/25/2022   Influenza, High Dose Seasonal PF 08/25/2017, 09/03/2018   PFIZER Comirnaty(Gray Top)Covid-19 Tri-Sucrose Vaccine 02/16/2021   PFIZER(Purple Top)SARS-COV-2 Vaccination 12/08/2019, 12/30/2019, 08/10/2020   PNEUMOCOCCAL CONJUGATE-20 09/03/2022   Pfizer Covid-19 Vaccine Bivalent Booster 68yr & up 08/07/2021   Pneumococcal Conjugate-13 04/01/2017   Pneumococcal-Unspecified 10/23/2015   Tdap 03/17/2017   Zoster Recombinat (Shingrix) 04/03/2017, 06/03/2017    MEDICATIONS/ALLERGIES   Current Meds  Medication Sig   alendronate (FOSAMAX) 70 MG tablet TAKE 1 TABLET BY MOUTH  WEEKLY WITH 8 OZ OF PLAIN  WATER 30 MINUTES BEFORE  FIRST FOOD, DRINK OR MEDS.  STAY UPRIGHT FOR 30 MINS   aspirin EC 81 MG tablet Take 81 mg by mouth daily.   atorvastatin (LIPITOR) 10 MG tablet TAKE 1 TABLET BY MOUTH DAILY   Calcium Carbonate-Vit D-Min (CALCIUM 1200 PO) Take by  mouth in the morning and at bedtime.   Cholecalciferol (VITAMIN D3) 2000 units TABS Take by mouth.   meloxicam (MOBIC) 7.5 MG tablet TAKE 1 TABLET BY MOUTH DAILY AS  NEEDED FOR PAIN   Multiple Vitamin (MULTIVITAMIN ADULT PO) Take by mouth.   traMADol (ULTRAM) 50 MG tablet Take by mouth every 6 (six) hours as needed. 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       Family History  Problem Relation Age of Onset   Breast cancer Mother  18   Breast cancer Maternal Aunt 69   Lung cancer Sister    Diabetes Sister    Prostate cancer Brother    Heart disease Brother    Stroke Cousin    Hypertension Cousin     OBJCTIVE -PE, EKG, labs   Wt Readings from Last 3 Encounters:  10/21/22 163 lb 3.2 oz (74 kg)  10/20/22 160 lb 12.8 oz (72.9 kg)  08/25/22 159 lb (72.1 kg)    Physical Exam: BP 136/80   Pulse 64   Ht '5\' 5"'$  (1.651 m)   Wt 163 lb 3.2 oz (74 kg)   SpO2 98%   BMI 27.16 kg/m  Physical Exam Vitals reviewed.  Constitutional:      General: She is not in acute distress.    Appearance: Normal appearance. She is normal weight. She is not ill-appearing or toxic-appearing.  HENT:     Head: Normocephalic and atraumatic.  Neck:     Vascular: No carotid bruit or JVD.  Cardiovascular:     Rate and Rhythm: Normal rate and regular rhythm. No extrasystoles are present.    Chest Wall: PMI is not displaced.     Pulses: Normal pulses.     Heart sounds: Normal heart sounds, S1 normal and S2 normal. No murmur heard.    No friction rub. No gallop.  Pulmonary:     Effort: Pulmonary effort is normal. No respiratory distress.     Breath sounds: Normal breath sounds. No wheezing, rhonchi or rales.  Chest:     Chest wall: No tenderness.  Musculoskeletal:        General: No swelling. Normal range of motion.     Cervical back: Normal range of motion and neck supple.  Skin:    General: Skin is warm and dry.  Neurological:     General: No focal deficit present.     Mental Status: She is alert and oriented to person, place, and time.  Psychiatric:        Mood and Affect: Mood normal.        Behavior: Behavior normal.        Thought Content: Thought content normal.        Judgment: Judgment normal.      Adult ECG Report  Rate: 64 ;  Rhythm: normal sinus rhythm and borderline low voltage.  Left atrial enlargement. ;   Narrative Interpretation:  Relatively normal.  Recent Labs: Reviewed Lab Results  Component Value Date    CHOL 134 09/03/2022   HDL 62.80 09/03/2022   LDLCALC 61 09/03/2022   TRIG 47.0 09/03/2022   CHOLHDL 2 09/03/2022   Lab Results  Component Value Date   CREATININE 0.70 10/20/2022   BUN 21 10/20/2022   NA 140 10/20/2022   K 4.5 10/20/2022   CL 104 10/20/2022   CO2 31 10/20/2022      Latest Ref Rng & Units 10/20/2022   10:32 AM 09/03/2022   10:06 AM 02/18/2022    2:14 PM  CBC  WBC 4.0 - 10.5 K/uL 5.0  5.3  5.8   Hemoglobin 12.0 - 15.0 g/dL 14.0  12.8  12.8   Hematocrit 36.0 - 46.0 % 43.1  39.2  38.9   Platelets 150.0 - 400.0 K/uL 324.0  292.0  328.0     Lab Results  Component Value Date   HGBA1C 6.0 09/03/2022   Lab Results  Component Value Date   TSH 2.65 09/03/2022    ================================================== I spent a total of 28 minutes with the patient spent in direct patient consultation.  Additional time spent with chart review  / charting (studies, outside notes, etc): 18 min Total Time: 46 min  Current medicines are reviewed at length with the patient today.  (+/- concerns) N/A  Notice: This dictation was prepared with Dragon dictation along with smart phrase technology. Any transcriptional errors that result from this process are unintentional and may not be corrected upon review.   Studies Ordered:  Orders Placed This Encounter  Procedures   EKG 12-Lead   No orders of the defined types were placed in this encounter.   Patient Instructions / Medication Changes & Studies & Tests Ordered   Patient Instructions  Medication Instructions:  No changes   *If you need a refill on your cardiac medications before your next appointment, please call your pharmacy*   Lab Work: Not needed    Testing/Procedures:  Not needed  Follow-Up: At [facility], you and your health needs are our priority.  As part of our continuing mission to provide you with exceptional heart care, we have created designated Provider Care Teams.  These Care Teams  include your primary Cardiologist (physician) and Advanced Practice Providers (APPs -  Physician Assistants and Nurse Practitioners) who all work together to provide you with the care you need, when you need it.     Your next appointment:   As needed     The format for your next appointment:   In Person  Provider:   Glenetta Hew, MD    Other Instructions    Okay from a cardiac standpoint to have left hip surgery with Dr Tacy Dura, MD, MS Glenetta Hew, M.D., M.S. Interventional Cardiologist  [place] [facility]  Pager # [phone] Phone # [phone] [address] [place], [place] [address]   Thank you for choosing [facility]!!

## 2022-10-22 NOTE — Assessment & Plan Note (Signed)
Brittany Farley is extremely healthy.  She has no active cardiac symptoms.  She has borderline hyperlipidemia for which she takes 10 mg of atorvastatin and lipids are well-controlled.  Her blood pressures are controlled without any medications. She does not have any anginal symptoms.  No heart failure symptoms.  She has never had a stroke.  She has normal renal function.  Based on revised cardiac risk index, she would be a low risk patient for low risk surgery.    Preoperative guidelines would suggest that no further evaluation is required in this otherwise healthy patient who was able to achieve well over 8 METS. No echocardiogram or stress test is warranted.  No additional medications are required.  Would recommend proceeding to the OR without any further evaluation.

## 2022-10-22 NOTE — Progress Notes (Incomplete)
Primary Care Provider: Burnard Hawthorne, [place] Cardiologist: Glenetta Hew, MD Electrophysiologist: None  Clinic Note: Chief Complaint  Patient presents with  . Pre-op Exam    Was referred for preop assessment for her second hip surgery this calendar year.  Has had right hip, now planning left hip.    ===================================  ASSESSMENT/PLAN   Problem List Items Addressed This Visit   None Visit Diagnoses     Pre-operative cardiovascular examination    -  Primary   Relevant Orders   EKG 12-Lead       ===================================  HPI:    Brittany Farley is a otherwise healthy 71 y.o. female who is being seen today for Preoperative Risk Assessment for Left Hip Arthroplasty at the request of Burnard Hawthorne, FNP.  Brittany Farley was seen back in May 2018 by Dr. Kathlyn Sacramento for evaluation of atypical chest pain.  This was evaluated with a treadmill stress test and echocardiogram that were both relatively normal.  Recent Hospitalizations:  She had right hip arthroplasty recently, but I am not sure when.  Reviewed  CV studies:    The following studies were reviewed today: (if available, images/films reviewed: From Epic Chart or Care Everywhere) Echocardiogram May 2018: EF 60 to 65%.  Normal wall motion.  Mild LA dilation. ETT 06/18/2017: Exercised 9 minutes.  Achieved 10.1 METS.  88% MPHR.  No ischemic changes on EKG.  No symptoms.--Low risk   Interval History:   Brittany Farley presents here today being told that she is a-year-old worse so she needs to be seen by cardiology for preop assessment for her left hip arthroplasty.  She underwent right hip arthroplasty and had no issues.  She is being seen by Dr. Harlow Mares from [facility].  CV Review of Symptoms (Summary) Cardiovascular ROS: {roscv:310661}  REVIEWED OF SYSTEMS   ROS  I have reviewed and (if needed) personally updated the patient's problem list, medications,  allergies, past medical and surgical history, social and family history.   PAST MEDICAL HISTORY   Past Medical History:  Diagnosis Date  . Arthritis    right hand  . Osteoporosis     PAST SURGICAL HISTORY   Past Surgical History:  Procedure Laterality Date  . ABDOMINAL HYSTERECTOMY  1980   had hysterectomy for fibroids. No cancer. Has both ovaries.   Marland Kitchen BARIATRIC SURGERY     late 48's  . CATARACT EXTRACTION W/PHACO Right 02/20/2020   Procedure: CATARACT EXTRACTION PHACO AND INTRAOCULAR LENS PLACEMENT (IOC) RIGHT 2.33  00:24.3;  Surgeon: Eulogio Bear, MD;  Location: [place];  Service: Ophthalmology;  Laterality: Right;  . CATARACT EXTRACTION W/PHACO Left 04/02/2020   Procedure: CATARACT EXTRACTION PHACO AND INTRAOCULAR LENS PLACEMENT ([place]) LEFT;  Surgeon: Eulogio Bear, MD;  Location: [place];  Service: Ophthalmology;  Laterality: Left;  1.68 0:28.8  . CHOLECYSTECTOMY  2011  . COLONOSCOPY WITH PROPOFOL N/A 11/29/2018   Procedure: COLONOSCOPY WITH PROPOFOL;  Surgeon: Virgel Manifold, MD;  Location: [facility];  Service: Endoscopy;  Laterality: N/A;  . OOPHORECTOMY    . right total hip arthroplasty Right    Dr Harlow Mares 05/2022  . TUMOR EXCISION N/A 11/28/2021   Procedure: TRANSRECTAL TUMOR EXCISION, polyp;  Surgeon: Jules Husbands, MD;  Location: [facility];  Service: General;  Laterality: N/A;    Immunization History  Administered Date(s) Administered  . Fluad Quad(high Dose 65+) 07/13/2019, 07/20/2020, 08/07/2021, 08/25/2022  . Influenza, High Dose Seasonal PF 08/25/2017, 09/03/2018  .  PFIZER Comirnaty(Gray Top)Covid-19 Tri-Sucrose Vaccine 02/16/2021  . PFIZER(Purple Top)SARS-COV-2 Vaccination 12/08/2019, 12/30/2019, 08/10/2020  . PNEUMOCOCCAL CONJUGATE-20 09/03/2022  . Pension scheme manager 68yr & up 08/07/2021  . Pneumococcal Conjugate-13 04/01/2017  . Pneumococcal-Unspecified 10/23/2015  . Tdap 03/17/2017  .  Zoster Recombinat (Shingrix) 04/03/2017, 06/03/2017    MEDICATIONS/ALLERGIES   Current Meds  Medication Sig  . alendronate (FOSAMAX) 70 MG tablet TAKE 1 TABLET BY MOUTH  WEEKLY WITH 8 OZ OF PLAIN  WATER 30 MINUTES BEFORE  FIRST FOOD, DRINK OR MEDS.  STAY UPRIGHT FOR 30 MINS  . aspirin EC 81 MG tablet Take 81 mg by mouth daily.  .Marland Kitchenatorvastatin (LIPITOR) 10 MG tablet TAKE 1 TABLET BY MOUTH DAILY  . Calcium Carbonate-Vit D-Min (CALCIUM 1200 PO) Take by mouth in the morning and at bedtime.  . Cholecalciferol (VITAMIN D3) 2000 units TABS Take by mouth.  . meloxicam (MOBIC) 7.5 MG tablet TAKE 1 TABLET BY MOUTH DAILY AS  NEEDED FOR PAIN  . Multiple Vitamin (MULTIVITAMIN ADULT PO) Take by mouth.  . traMADol (ULTRAM) 50 MG tablet Take by mouth every 6 (six) hours as needed. PT TAKES I TABLET DAILY EVERY 6 HOURS AS NEEDED    No Known Allergies  SOCIAL HISTORY/FAMILY HISTORY   Reviewed in Epic:   Social History   Tobacco Use  . Smoking status: Never  . Smokeless tobacco: Never  Vaping Use  . Vaping Use: Never used  Substance Use Topics  . Alcohol use: Not Currently    Comment: occasional - 1-2 drinks/year  . 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       Family History  Problem Relation Age of Onset  . Breast cancer Mother 772 . Breast cancer Maternal Aunt 70  . Lung cancer Sister   . Diabetes Sister   . Prostate cancer Brother   . Heart disease Brother   . Stroke Cousin   . Hypertension Cousin     OBJCTIVE -PE, EKG, labs   Wt Readings from Last 3 Encounters:  10/21/22 163 lb 3.2 oz (74 kg)  10/20/22 160 lb 12.8 oz (72.9 kg)  08/25/22 159 lb (72.1 kg)    Physical Exam: BP 136/80   Pulse 64   Ht '5\' 5"'$  (1.651 m)   Wt 163 lb 3.2 oz (74 kg)   SpO2 98%   BMI 27.16 kg/m  Physical Exam   Adult ECG Report  Rate: *** ;  Rhythm:  {rhythm:17366};   Narrative Interpretation: ***  Recent Labs:  ***  Lab Results  Component Value Date   CHOL 134 09/03/2022   HDL 62.80 09/03/2022   LDLCALC 61 09/03/2022   TRIG 47.0 09/03/2022   CHOLHDL 2 09/03/2022   Lab Results  Component Value Date   CREATININE 0.70 10/20/2022   BUN 21 10/20/2022   NA 140 10/20/2022   K 4.5 10/20/2022   CL 104 10/20/2022   CO2 31 10/20/2022      Latest Ref Rng & Units 10/20/2022   10:32 AM 09/03/2022   10:06 AM 02/18/2022    2:14 PM  CBC  WBC 4.0 - 10.5 K/uL 5.0  5.3  5.8   Hemoglobin 12.0 - 15.0 g/dL 14.0  12.8  12.8   Hematocrit 36.0 - 46.0 % 43.1  39.2  38.9   Platelets 150.0 -  400.0 K/uL 324.0  292.0  328.0     Lab Results  Component Value Date   HGBA1C 6.0 09/03/2022   Lab Results  Component Value Date   TSH 2.65 09/03/2022    ================================================== I spent a total of *** minutes with the patient spent in direct patient consultation.  Additional time spent with chart review  / charting (studies, outside notes, etc): *** min Total Time: *** min  Current medicines are reviewed at length with the patient today.  (+/- concerns) ***  Notice: This dictation was prepared with Dragon dictation along with smart phrase technology. Any transcriptional errors that result from this process are unintentional and may not be corrected upon review.   Studies Ordered:  Orders Placed This Encounter  Procedures  . EKG 12-Lead   No orders of the defined types were placed in this encounter.   Patient Instructions / Medication Changes & Studies & Tests Ordered   Patient Instructions  Medication Instructions:  No changes   *If you need a refill on your cardiac medications before your next appointment, please call your pharmacy*   Lab Work: Not needed    Testing/Procedures:  Not needed  Follow-Up: At [facility], you and your health needs are our priority.  As part of our continuing mission  to provide you with exceptional heart care, we have created designated Provider Care Teams.  These Care Teams include your primary Cardiologist (physician) and Advanced Practice Providers (APPs -  Physician Assistants and Nurse Practitioners) who all work together to provide you with the care you need, when you need it.     Your next appointment:   As needed     The format for your next appointment:   In Person  Provider:   Glenetta Hew, MD    Other Instructions    Okay from a cardiac standpoint to have left hip surgery with Dr Lannie Fields, MD, MS Glenetta Hew, M.D., M.S. Interventional Cardiologist  [place]  Pager # [phone] Phone # [phone] [address]. [place], [place] [address]   Thank you for choosing [facility] at [place]!!

## 2022-10-23 NOTE — Telephone Encounter (Signed)
Called and spoke to pt and she has appt 10/23/22 to give urine

## 2022-10-24 ENCOUNTER — Other Ambulatory Visit (INDEPENDENT_AMBULATORY_CARE_PROVIDER_SITE_OTHER): Payer: Medicare Other

## 2022-10-24 DIAGNOSIS — M25551 Pain in right hip: Secondary | ICD-10-CM

## 2022-10-24 DIAGNOSIS — D721 Eosinophilia, unspecified: Secondary | ICD-10-CM

## 2022-10-24 DIAGNOSIS — M25552 Pain in left hip: Secondary | ICD-10-CM | POA: Diagnosis not present

## 2022-10-24 LAB — URINALYSIS, ROUTINE W REFLEX MICROSCOPIC
Bilirubin Urine: NEGATIVE
Hgb urine dipstick: NEGATIVE
Ketones, ur: NEGATIVE
Leukocytes,Ua: NEGATIVE
Nitrite: NEGATIVE
Specific Gravity, Urine: >=1.03 — AB (ref 1.000–1.030)
Total Protein, Urine: NEGATIVE
Urine Glucose: 100 — AB
Urobilinogen, UA: 0.2 (ref 0.0–1.0)
pH: 5.5 (ref 5.0–8.0)

## 2022-10-24 NOTE — Addendum Note (Signed)
Addended by: Leeanne Rio on: 10/24/2022 10:06 AM   Modules accepted: Orders

## 2022-10-25 LAB — URINE CULTURE
MICRO NUMBER:: 14321565
Result:: NO GROWTH
SPECIMEN QUALITY:: ADEQUATE

## 2022-10-26 ENCOUNTER — Other Ambulatory Visit: Payer: Self-pay | Admitting: Family

## 2022-10-26 DIAGNOSIS — R81 Glycosuria: Secondary | ICD-10-CM

## 2022-10-27 ENCOUNTER — Other Ambulatory Visit: Payer: Self-pay

## 2022-10-28 ENCOUNTER — Ambulatory Visit: Payer: Medicare Other | Admitting: Gastroenterology

## 2022-10-28 ENCOUNTER — Encounter: Payer: Self-pay | Admitting: Gastroenterology

## 2022-10-28 VITALS — BP 164/85 | HR 56 | Temp 98.9°F | Ht 65.0 in | Wt 164.0 lb

## 2022-10-28 DIAGNOSIS — K760 Fatty (change of) liver, not elsewhere classified: Secondary | ICD-10-CM | POA: Diagnosis not present

## 2022-10-28 DIAGNOSIS — Z23 Encounter for immunization: Secondary | ICD-10-CM | POA: Diagnosis not present

## 2022-10-28 NOTE — Progress Notes (Signed)
Jonathon Bellows MD, MRCP(U.K) [address]  [place]  [place], [place] [address]  Main: [phone]  Fax: [phone]   Gastroenterology Consultation  Referring Provider:     Burnard Hawthorne, FNP Primary Care Physician:  Burnard Hawthorne, FNP Primary Gastroenterologist:  Dr. Jonathon Bellows  Reason for Consultation:     Fatty liver         HPI:   Brittany Farley is a 71 y.o. y/o female referred for consultation & management  by Burnard Hawthorne, FNP.    She was previously seen by Dr. Bonna Gains in December 2019 for elevated alkaline phosphatase at that point of time was having alcohol 1 or 2 times a month negative for hepatitis C although alkaline phosphatase elevated, GT was negative the fractionated alkaline phosphatase was at a higher proportion of bone and patient had osteoporosis.  Imaging showed fatty liver at that point of time.  She had a colonoscopy in 2020 January that showed 3 sessile polyps in the ascending colon and cecum and 1 sessile polyp in the ascending colon and a 5 mm polyp at the anus the polyps were hyperplastic.  Repeat colonoscopy in 10 years was suggested.   Today she is here to see me for hepatic steatosis.  In April 2023 had a CT abdomen pelvis with contrast that showed enteric sutures inside the stomach GE junction showed a postcholecystectomy state borderline hepatic steatosis.  10/20/2022: CMP normal, INR 1.0 hemoglobin 14 g MCV 77 vitamin D 56  Hepatitis A total antibody negative hepatitis B surface antibody negative  She says that previously she weighed over 200 pounds and with the aid of exercise and healthy eating she has dropped her weight 264.  She was previously hypertensive which has resolved.  Denies any excess alcohol consumption. Past Medical History:  Diagnosis Date   Arthritis    right hand   Osteoporosis     Past Surgical History:  Procedure Laterality Date   ABDOMINAL HYSTERECTOMY  1980   had hysterectomy for fibroids. No  cancer. Has both ovaries.    BARIATRIC SURGERY     late 60's   CATARACT EXTRACTION W/PHACO Right 02/20/2020   Procedure: CATARACT EXTRACTION PHACO AND INTRAOCULAR LENS PLACEMENT (IOC) RIGHT 2.33  00:24.3;  Surgeon: Eulogio Bear, MD;  Location: [place];  Service: Ophthalmology;  Laterality: Right;   CATARACT EXTRACTION W/PHACO Left 04/02/2020   Procedure: CATARACT EXTRACTION PHACO AND INTRAOCULAR LENS PLACEMENT ([place]) LEFT;  Surgeon: Eulogio Bear, MD;  Location: [place];  Service: Ophthalmology;  Laterality: Left;  1.68 0:28.8   CHOLECYSTECTOMY  2011   COLONOSCOPY WITH PROPOFOL N/A 11/29/2018   Procedure: COLONOSCOPY WITH PROPOFOL;  Surgeon: Virgel Manifold, MD;  Location: [facility];  Service: Endoscopy;  Laterality: N/A;   OOPHORECTOMY     right total hip arthroplasty Right    Dr Harlow Mares 05/2022   TUMOR EXCISION N/A 11/28/2021   Procedure: TRANSRECTAL TUMOR EXCISION, polyp;  Surgeon: Jules Husbands, MD;  Location: [facility];  Service: General;  Laterality: N/A;    Prior to Admission medications   Medication Sig Start Date End Date Taking? Authorizing Provider  alendronate (FOSAMAX) 70 MG tablet TAKE 1 TABLET BY MOUTH  WEEKLY WITH 8 OZ OF PLAIN  WATER 30 MINUTES BEFORE  FIRST FOOD, DRINK OR MEDS.  STAY UPRIGHT FOR 30 MINS 01/09/22   Shamleffer, Melanie Crazier, MD  aspirin EC 81 MG tablet Take 81 mg by mouth daily.  [provider]  atorvastatin (LIPITOR) 10 MG tablet TAKE 1 TABLET BY MOUTH DAILY 10/06/22   Burnard Hawthorne, FNP  Calcium Carbonate-Vit D-Min (CALCIUM 1200 PO) Take by mouth in the morning and at bedtime.    [provider]  Cholecalciferol (VITAMIN D3) 2000 units TABS Take by mouth.    [provider]  docusate sodium (COLACE) 100 MG capsule Take 100 mg by mouth daily. 10/22/22   [provider]  meloxicam (MOBIC) 7.5 MG tablet TAKE 1 TABLET BY MOUTH DAILY AS  NEEDED FOR PAIN 03/18/22   Burnard Hawthorne, FNP  methocarbamol (ROBAXIN) 500 MG tablet Take 500 mg by mouth 3 (three) times daily. 10/22/22   [provider]  Multiple Vitamin (MULTIVITAMIN ADULT PO) Take by mouth.    [provider]  oxyCODONE (OXY IR/ROXICODONE) 5 MG immediate release tablet Take 5 mg by mouth every 4 (four) hours as needed. 10/22/22   [provider]  traMADol (ULTRAM) 50 MG tablet Take by mouth every 6 (six) hours as needed. PT TAKES I TABLET DAILY EVERY 6 HOURS AS NEEDED    [provider]    Family History  Problem Relation Age of Onset   Breast cancer Mother 15   Breast cancer Maternal Aunt 32   Lung cancer Sister    Diabetes Sister    Prostate cancer Brother    Heart disease Brother    Stroke Cousin    Hypertension Cousin      Social History   Tobacco Use   Smoking status: Never   Smokeless tobacco: Never  Vaping Use   Vaping Use: Never used  Substance Use Topics   Alcohol use: Not Currently    Comment: occasional - 1-2 drinks/year   Drug use: Never    Allergies as of 10/28/2022   (No Known Allergies)   Body mass index is 27.29 kg/m.  Review of Systems:    All systems reviewed and negative except where noted in HPI.   Physical Exam:  BP (!) 164/85   Pulse (!) 56   Temp 98.9 F (37.2 C) (Oral)   Ht '5\' 5"'$  (1.651 m)   Wt 164 lb (74.4 kg)   BMI 27.29 kg/m  No LMP recorded. Patient is postmenopausal. Psych:  Alert and cooperative. Normal mood and affect. General:   Alert,  Well-developed, well-nourished, pleasant and cooperative in NAD Head:  Normocephalic and atraumatic. Eyes:  Sclera clear, no icterus.   Conjunctiva pink.   Neurologic:  Alert and oriented x3;  grossly normal neurologically. Psych:  Alert and cooperative. Normal mood and affect.  Imaging Studies: No results found.  Assessment and Plan:   Brittany Farley is a 71 y.o. y/o female has been referred for hepatic steatosis seen on imaging initially in 2019.  LFTs are normal  not immune to hepatitis a and B.   Plan 1.  Hepatitis a and B vaccine 2.  Encourage weight loss exercise Mediterranean diet, patient information provided  3.  Follow-up in a year new drugs are expected to arrive for hepatic steatosis and we can decide if she would be a candidate for any of these until then continue lifestyle changes   Follow up in 8-12 months   Dr Jonathon Bellows MD,MRCP(U.K)

## 2022-10-29 NOTE — Addendum Note (Signed)
Addended by: Wayna Chalet on: 10/29/2022 02:11 PM   Modules accepted: Orders

## 2022-10-30 DIAGNOSIS — M1612 Unilateral primary osteoarthritis, left hip: Secondary | ICD-10-CM | POA: Diagnosis not present

## 2022-10-30 DIAGNOSIS — E785 Hyperlipidemia, unspecified: Secondary | ICD-10-CM | POA: Diagnosis not present

## 2022-10-30 DIAGNOSIS — Z7982 Long term (current) use of aspirin: Secondary | ICD-10-CM | POA: Diagnosis not present

## 2022-10-30 DIAGNOSIS — Z96641 Presence of right artificial hip joint: Secondary | ICD-10-CM | POA: Diagnosis not present

## 2022-10-31 DIAGNOSIS — M1612 Unilateral primary osteoarthritis, left hip: Secondary | ICD-10-CM | POA: Diagnosis not present

## 2022-10-31 DIAGNOSIS — Z7982 Long term (current) use of aspirin: Secondary | ICD-10-CM | POA: Diagnosis not present

## 2022-10-31 DIAGNOSIS — Z96641 Presence of right artificial hip joint: Secondary | ICD-10-CM | POA: Diagnosis not present

## 2022-10-31 DIAGNOSIS — E785 Hyperlipidemia, unspecified: Secondary | ICD-10-CM | POA: Diagnosis not present

## 2022-11-06 DIAGNOSIS — M25652 Stiffness of left hip, not elsewhere classified: Secondary | ICD-10-CM | POA: Diagnosis not present

## 2022-11-06 DIAGNOSIS — M25552 Pain in left hip: Secondary | ICD-10-CM | POA: Diagnosis not present

## 2022-11-12 DIAGNOSIS — M25652 Stiffness of left hip, not elsewhere classified: Secondary | ICD-10-CM | POA: Diagnosis not present

## 2022-11-12 DIAGNOSIS — M25552 Pain in left hip: Secondary | ICD-10-CM | POA: Diagnosis not present

## 2022-11-14 DIAGNOSIS — M25552 Pain in left hip: Secondary | ICD-10-CM | POA: Diagnosis not present

## 2022-11-14 DIAGNOSIS — M25652 Stiffness of left hip, not elsewhere classified: Secondary | ICD-10-CM | POA: Diagnosis not present

## 2022-11-21 ENCOUNTER — Other Ambulatory Visit: Payer: Self-pay | Admitting: Internal Medicine

## 2022-12-01 ENCOUNTER — Ambulatory Visit: Payer: Medicare Other | Admitting: Gastroenterology

## 2022-12-01 DIAGNOSIS — Z23 Encounter for immunization: Secondary | ICD-10-CM

## 2022-12-05 DIAGNOSIS — M25652 Stiffness of left hip, not elsewhere classified: Secondary | ICD-10-CM | POA: Diagnosis not present

## 2022-12-05 DIAGNOSIS — M25552 Pain in left hip: Secondary | ICD-10-CM | POA: Diagnosis not present

## 2022-12-08 DIAGNOSIS — Z96641 Presence of right artificial hip joint: Secondary | ICD-10-CM | POA: Diagnosis not present

## 2022-12-08 DIAGNOSIS — M25552 Pain in left hip: Secondary | ICD-10-CM | POA: Diagnosis not present

## 2022-12-08 DIAGNOSIS — M25652 Stiffness of left hip, not elsewhere classified: Secondary | ICD-10-CM | POA: Diagnosis not present

## 2022-12-08 DIAGNOSIS — Z96642 Presence of left artificial hip joint: Secondary | ICD-10-CM | POA: Diagnosis not present

## 2022-12-10 DIAGNOSIS — M25552 Pain in left hip: Secondary | ICD-10-CM | POA: Diagnosis not present

## 2022-12-10 DIAGNOSIS — M25652 Stiffness of left hip, not elsewhere classified: Secondary | ICD-10-CM | POA: Diagnosis not present

## 2022-12-12 ENCOUNTER — Ambulatory Visit: Payer: Medicare Other | Admitting: Internal Medicine

## 2022-12-12 NOTE — Progress Notes (Signed)
Pt here for immunization.

## 2022-12-18 DIAGNOSIS — M25552 Pain in left hip: Secondary | ICD-10-CM | POA: Diagnosis not present

## 2022-12-18 DIAGNOSIS — M25652 Stiffness of left hip, not elsewhere classified: Secondary | ICD-10-CM | POA: Diagnosis not present

## 2022-12-23 DIAGNOSIS — M25652 Stiffness of left hip, not elsewhere classified: Secondary | ICD-10-CM | POA: Diagnosis not present

## 2022-12-23 DIAGNOSIS — M25552 Pain in left hip: Secondary | ICD-10-CM | POA: Diagnosis not present

## 2023-01-23 ENCOUNTER — Encounter: Payer: Self-pay | Admitting: Internal Medicine

## 2023-01-23 ENCOUNTER — Ambulatory Visit: Payer: Medicare Other | Admitting: Internal Medicine

## 2023-01-23 VITALS — BP 124/72 | HR 76 | Ht 65.0 in | Wt 162.0 lb

## 2023-01-23 DIAGNOSIS — M810 Age-related osteoporosis without current pathological fracture: Secondary | ICD-10-CM

## 2023-01-23 MED ORDER — ALENDRONATE SODIUM 70 MG PO TABS: 70.0000 mg | ORAL_TABLET | ORAL | 3 refills | Status: DC

## 2023-01-23 NOTE — Progress Notes (Signed)
Name: Brittany Farley  MRN/ DOB: [id number], 04-08-1951    Age/ Sex: 72 y.o., female     PCP: Burnard Hawthorne, FNP   Reason for Endocrinology Evaluation: Osteoporosis/elevated Alk. Phos      Initial Endocrinology Clinic Visit: 06/11/2020    PATIENT IDENTIFIER: Brittany Farley is a 72 y.o., female with a past medical history of low bone density and dyslipidemia. She has followed with [facility] since 06/11/2020 for consultative assistance with management of her Elevated Alk. Phos and osteoporosis  HISTORICAL SUMMARY:  Pt has been noted to have elevated Alkaline phosphatase since 2018 with fluctuating readings.  Alk. Phos isozymes showed 58% of bone origin and 42 % liver origin.    Pt was diagnosed with osteoporosis:2019   Menarche at age : 44 Menopausal at age : Hysterectomy less than 30 yrs Fracture Hx: no Hx of HRT: no FH of osteoporosis or hip fracture: Mother hip fracture   Started Alendronate 06/2020  Repeat DXA scan in 2023 showed improvement of BMD at hips but unfortunately her T-score at the distal radius showed worsening from a -3.0 to a -3.4  SUBJECTIVE:    Today (01/23/2023):  Brittany Farley is here for a follow up on osteoporosis and elevated Alk. Phos.    She is S/P bilateral hip replacement over the past year  Recent left back pain Continues with exercise  Denies heartburn  No recent falls  Denies constipation or diarrhea  Denies heartburn     HOME ENDOCRINE MEDICATIONS Alendronate 70 mg weekly  Calcium 1200 mg daily  Vitamin D 2000 iu daily      HISTORY:  Past Medical History:  Past Medical History:  Diagnosis Date   Arthritis    right hand   Osteoporosis    Past Surgical History:  Past Surgical History:  Procedure Laterality Date   ABDOMINAL HYSTERECTOMY  1980   had hysterectomy for fibroids. No cancer. Has both ovaries.    BARIATRIC SURGERY     late 60's   CATARACT EXTRACTION W/PHACO Right 02/20/2020   Procedure: CATARACT  EXTRACTION PHACO AND INTRAOCULAR LENS PLACEMENT ([facility]) RIGHT 2.33  00:24.3;  Surgeon: Eulogio Bear, MD;  Location: [facility];  Service: Ophthalmology;  Laterality: Right;   CATARACT EXTRACTION W/PHACO Left 04/02/2020   Procedure: CATARACT EXTRACTION PHACO AND INTRAOCULAR LENS PLACEMENT ([place]) LEFT;  Surgeon: Eulogio Bear, MD;  Location: [place];  Service: Ophthalmology;  Laterality: Left;  1.68 0:28.8   CHOLECYSTECTOMY  2011   COLONOSCOPY WITH PROPOFOL N/A 11/29/2018   Procedure: COLONOSCOPY WITH PROPOFOL;  Surgeon: Virgel Manifold, MD;  Location: [facility];  Service: Endoscopy;  Laterality: N/A;   OOPHORECTOMY     right total hip arthroplasty Right    Dr Harlow Mares 05/2022   TUMOR EXCISION N/A 11/28/2021   Procedure: TRANSRECTAL TUMOR EXCISION, polyp;  Surgeon: Jules Husbands, MD;  Location: [facility];  Service: General;  Laterality: N/A;   Social History:  reports that she has never smoked. She has never used smokeless tobacco. She reports that she does not currently use alcohol. She reports that she does not use drugs. Family History:  Family History  Problem Relation Age of Onset   Breast cancer Mother 7   Breast cancer Maternal Aunt 51   Lung cancer Sister    Diabetes Sister    Prostate cancer Brother    Heart disease Brother    Stroke Cousin    Hypertension Cousin  HOME MEDICATIONS: Allergies as of 01/23/2023   No Known Allergies      Medication List        Accurate as of January 23, 2023  9:24 AM. If you have any questions, ask your nurse or doctor.          STOP taking these medications    Colace 100 MG capsule Generic drug: docusate sodium Stopped by: Dorita Sciara, MD   oxyCODONE 5 MG immediate release tablet Commonly known as: Oxy IR/ROXICODONE Stopped by: Dorita Sciara, MD   traMADol 50 MG tablet Commonly known as: ULTRAM Stopped by: Dorita Sciara, MD       TAKE these medications     alendronate 70 MG tablet Commonly known as: FOSAMAX TAKE 1 TABLET BY MOUTH WEEKLY  WITH 8 OZ OF PLAIN WATER 30  MINUTES BEFORE FIRST FOOD, [place] OR MEDS. STAY UPRIGHT FOR 30  MINS   aspirin EC 81 MG tablet Take 81 mg by mouth daily.   atorvastatin 10 MG tablet Commonly known as: LIPITOR TAKE 1 TABLET BY MOUTH DAILY   CALCIUM 1200 PO Take by mouth in the morning and at bedtime.   meloxicam 7.5 MG tablet Commonly known as: MOBIC TAKE 1 TABLET BY MOUTH DAILY AS  NEEDED FOR PAIN   methocarbamol 500 MG tablet Commonly known as: ROBAXIN Take 500 mg by mouth 3 (three) times daily.   MULTIVITAMIN ADULT PO Take by mouth.   Vitamin D3 50 MCG (2000 UT) Tabs Generic drug: Cholecalciferol Take by mouth.          OBJECTIVE:   PHYSICAL EXAM: VS: BP 124/72 (BP Location: Left Arm, Patient Position: Sitting, Cuff Size: Large)   Pulse 76   Ht 5\' 5"  (1.651 m)   Wt 162 lb (73.5 kg)   SpO2 94%   BMI 26.96 kg/m    EXAM: General: Pt appears well and is in NAD  Neck: General: Supple without adenopathy. Thyroid: Thyroid size normal.  No goiter or nodules appreciated.  Lungs: Clear with good BS bilat   Heart: Auscultation: RRR.  Abdomen: soft, nontender, without masses or organomegaly palpable  Extremities:  BL LE: No pretibial edema normal  Mental Status: Judgment, insight: Intact Orientation: Oriented to time, place, and person Mood and affect: No depression, anxiety, or agitation     DATA REVIEWED:   Latest Reference Range & Units 10/20/22 10:32  Sodium 135 - 145 mEq/L 140  Potassium 3.5 - 5.1 mEq/L 4.5  Chloride 96 - 112 mEq/L 104  CO2 19 - 32 mEq/L 31  Glucose 70 - 99 mg/dL 101 (H)  BUN 6 - 23 mg/dL 21  Creatinine 0.40 - 1.20 mg/dL 0.70  Calcium 8.4 - 10.5 mg/dL 9.5  Alkaline Phosphatase 39 - 117 U/L 88  Albumin 3.5 - 5.2 g/dL 4.1  AST 0 - 37 U/L 37  ALT 0 - 35 U/L 33  Total Protein 6.0 - 8.3 g/dL 6.9  Total Bilirubin 0.2 - 1.2 mg/dL 0.3  GFR >60.00 mL/min  85.90    Latest Reference Range & Units Most Recent  TSH 0.35 - 5.50 uIU/mL 2.65 09/03/22 10:06      DXA 12/11/2021  T-score BMD         %Change vs. Previous Significant Change (*) DualFemur Total Right 12/11/2021 72.4 Osteopenia -1.3 0.842 g/cm2 8.5% Yes DualFemur Total Right 03/15/2020 70.6 Osteopenia -1.8 0.776 g/cm2 -3.2% Yes    DualFemur Total Mean 12/11/2021 72.4 Osteopenia -1.3 0.847 g/cm2 5.5% Yes DualFemur  Total Mean 03/15/2020 70.6 Osteopenia -1.6 0.803 g/cm2 -4.6% Yes  Left Forearm Radius 33% 12/11/2021 72.4 Osteoporosis -3.4 0.578 g/cm2 -6.3% Yes Left Forearm Radius 33% 03/15/2020 70.6 Osteoporosis -3.0 0.617 g/cm2 4.6% -    ASSESSMENT: The BMD measured at Forearm Radius 33% is 0.578 g/cm2 with a T-score of -3.4. This patient is considered osteoporotic according to World     Abdominal Ultrasound 11/05/2018   Mildly increased CBD diameter is likely a sequelae of prior cholecystectomy. There is no intrahepatic biliary ductal dilatation to suggest acute bile duct obstruction. 2. Evidence of hepatic steatosis  ASSESSMENT / PLAN / RECOMMENDATIONS:   1.Osteoporosis :    -Unfortunately her last DXA scan showed worsening of BMD at the left forearm from a T-score of -3.0 to -3.4, but there has been improvement at the hips - She is compliant with alendronate , calcium and vitamin D intake as well as exercise -We did briefly discuss overlapping alendronate with Evenity for a year but the patient opted to remain on alendronate and we will check bone density again next year and proceed from there -Historically she has had normal PTH, TFTs, and serum calcium  Medications : Continue Alendronate 70 mg weekly  Continue Calcium 1200 mg daily  Continue Vitamin D 2000 iu daily    2. Elevated Alkaline Phosphatase:    -This has resolved - Large part of this Is due to increased bone resorption secondary to osteoporosis, and to some extent from hepatic steatosis         F/U in 1 yr     Signed electronically by: Mack Guise, MD  [facility] Endocrinology  [facility] [address] [place], [place] [address] Phone: [phone] FAX: [phone]      CC: Burnard Hawthorne, FNP [address] [place] [address] Phone: [phone]  Fax: [phone]   Return to Endocrinology clinic as below: Future Appointments  Date Time Provider [place]  05/04/2023 11:00 AM [facility] [place] NURSE [facility] None  08/31/2023  8:00 AM Arnett, Yvetta Coder, FNP [id number]

## 2023-01-23 NOTE — Patient Instructions (Signed)
-   Alendronate ( Fosamax) 70 mg , 1 tablet weekly - Continue Calcium and Vitamin D daily

## 2023-03-30 ENCOUNTER — Ambulatory Visit: Payer: Medicare Other

## 2023-05-04 ENCOUNTER — Ambulatory Visit (INDEPENDENT_AMBULATORY_CARE_PROVIDER_SITE_OTHER): Payer: Medicare Other | Admitting: Gastroenterology

## 2023-05-04 DIAGNOSIS — Z23 Encounter for immunization: Secondary | ICD-10-CM | POA: Diagnosis not present

## 2023-05-08 NOTE — Progress Notes (Signed)
Pt here for Twinrix vaccine.

## 2023-06-16 ENCOUNTER — Other Ambulatory Visit: Payer: Self-pay | Admitting: Family

## 2023-06-16 DIAGNOSIS — E785 Hyperlipidemia, unspecified: Secondary | ICD-10-CM

## 2023-06-17 ENCOUNTER — Encounter: Payer: Self-pay | Admitting: Family

## 2023-06-17 ENCOUNTER — Ambulatory Visit (INDEPENDENT_AMBULATORY_CARE_PROVIDER_SITE_OTHER): Payer: Medicare Other | Admitting: Family

## 2023-06-17 VITALS — BP 134/72 | HR 65 | Temp 98.0°F | Ht 64.0 in | Wt 162.8 lb

## 2023-06-17 DIAGNOSIS — R899 Unspecified abnormal finding in specimens from other organs, systems and tissues: Secondary | ICD-10-CM | POA: Diagnosis not present

## 2023-06-17 DIAGNOSIS — E785 Hyperlipidemia, unspecified: Secondary | ICD-10-CM | POA: Diagnosis not present

## 2023-06-17 DIAGNOSIS — M810 Age-related osteoporosis without current pathological fracture: Secondary | ICD-10-CM | POA: Diagnosis not present

## 2023-06-17 DIAGNOSIS — R7309 Other abnormal glucose: Secondary | ICD-10-CM | POA: Diagnosis not present

## 2023-06-17 DIAGNOSIS — K760 Fatty (change of) liver, not elsewhere classified: Secondary | ICD-10-CM

## 2023-06-17 DIAGNOSIS — Z1231 Encounter for screening mammogram for malignant neoplasm of breast: Secondary | ICD-10-CM

## 2023-06-17 LAB — POCT GLYCOSYLATED HEMOGLOBIN (HGB A1C): Hemoglobin A1C: 5.5 % (ref 4.0–5.6)

## 2023-06-17 LAB — COMPREHENSIVE METABOLIC PANEL
ALT: 26 U/L (ref 0–35)
AST: 39 U/L — ABNORMAL HIGH (ref 0–37)
Albumin: 4 g/dL (ref 3.5–5.2)
Alkaline Phosphatase: 98 U/L (ref 39–117)
BUN: 11 mg/dL (ref 6–23)
CO2: 26 mEq/L (ref 19–32)
Calcium: 9.2 mg/dL (ref 8.4–10.5)
Chloride: 106 mEq/L (ref 96–112)
Creatinine, Ser: 0.69 mg/dL (ref 0.40–1.20)
GFR: 85.8 mL/min (ref >60.00)
Glucose, Bld: 100 mg/dL — ABNORMAL HIGH (ref 70–99)
Potassium: 3.9 mEq/L (ref 3.5–5.1)
Sodium: 138 mEq/L (ref 135–145)
Total Bilirubin: 0.4 mg/dL (ref 0.2–1.2)
Total Protein: 7.3 g/dL (ref 6.0–8.3)

## 2023-06-17 LAB — CBC WITH DIFFERENTIAL/PLATELET
Basophils Absolute: 0 10*3/uL (ref 0.0–0.1)
Basophils Relative: 0.2 % (ref 0.0–3.0)
Eosinophils Absolute: 0.4 10*3/uL (ref 0.0–0.7)
Eosinophils Relative: 8.7 % — ABNORMAL HIGH (ref 0.0–5.0)
HCT: 44.4 % (ref 36.0–46.0)
Hemoglobin: 14.1 g/dL (ref 12.0–15.0)
Lymphocytes Relative: 35 % (ref 12.0–46.0)
Lymphs Abs: 1.6 10*3/uL (ref 0.7–4.0)
MCHC: 31.8 g/dL (ref 30.0–36.0)
MCV: 78.8 fl (ref 78.0–100.0)
Monocytes Absolute: 0.3 10*3/uL (ref 0.1–1.0)
Monocytes Relative: 7.2 % (ref 3.0–12.0)
Neutro Abs: 2.3 10*3/uL (ref 1.4–7.7)
Neutrophils Relative %: 48.9 % (ref 43.0–77.0)
Platelets: 337 10*3/uL (ref 150.0–400.0)
RBC: 5.64 Mil/uL — ABNORMAL HIGH (ref 3.87–5.11)
RDW: 14.2 % (ref 11.5–15.5)
WBC: 4.6 10*3/uL (ref 4.0–10.5)

## 2023-06-17 LAB — LIPID PANEL
Cholesterol: 162 mg/dL (ref 0–200)
HDL: 67.8 mg/dL (ref >39.00)
LDL Cholesterol: 83 mg/dL (ref 0–99)
NonHDL: 94.13
Total CHOL/HDL Ratio: 2
Triglycerides: 55 mg/dL (ref 0.0–149.0)
VLDL: 11 mg/dL (ref 0.0–40.0)

## 2023-06-17 LAB — VITAMIN D 25 HYDROXY (VIT D DEFICIENCY, FRACTURES): VITD: 67.16 ng/mL (ref 30.00–100.00)

## 2023-06-17 NOTE — Patient Instructions (Addendum)
Please call  and schedule your 3D mammogram and /or bone density scan as we discussed.   [facility]  ( new location in 2023)  [address], [place], [place] [address]  [place], [place]  [phone]    Please download Myfitness Pal App ( basic version is free).   You may log every thing you eat for even 2-3 days to get a better of idea of total daily calories. To loose weight, we have to create caloric deficit to loose weight. The goal is 1-2 lbs per week of weight loss.   Excellent article below from [facility].   https://www.health.CriticalZ.it  Calorie counting made easy  Eat less, exercise more. If only it were that simple! As most dieters know, losing weight can be very challenging. As this report details, a range of influences can affect how people gain and lose weight. But a basic understanding of how to tip your energy balance in favor of weight loss is a good place to start.  Start by determining how many calories you should consume each day. To do so, you need to know how many calories you need to maintain your current weight. Doing this requires a few simple calculations.  First, multiply your current weight by 15 -- that's roughly the number of calories per pound of body weight needed to maintain your current weight if you are moderately active. Moderately active means getting at least 30 minutes of physical activity a day in the form of exercise (walking at a brisk pace, climbing stairs, or active gardening). Let's say you're a woman who is 5 feet, 4 inches tall and weighs 155 pounds, and you need to lose about 15 pounds to put you in a healthy weight range. If you multiply 155 by 15, you will get 2,325, which is the number of calories per day that you need in order to maintain your current weight (weight-maintenance calories). To lose weight, you will need to get below that total.  For example, to lose 1 to  2 pounds a week -- a rate that experts consider safe -- your food consumption should provide 500 to 1,000 calories less than your total weight-maintenance calories. If you need 2,325 calories a day to maintain your current weight, reduce your daily calories to between 1,325 and 1,825. If you are sedentary, you will also need to build more activity into your day. In order to lose at least a pound a week, try to do at least 30 minutes of physical activity on most days, and reduce your daily calorie intake by at least 500 calories. However, calorie intake should not fall below 1,200 a day in women or 1,500 a day in men, except under the supervision of a health professional. Eating too few calories can endanger your health by depriving you of needed nutrients.  Meeting your calorie target How can you meet your daily calorie target? One approach is to add up the number of calories per serving of all the foods that you eat, and then plan your menus accordingly. You can buy books that list calories per serving for many foods. In addition, the nutrition labels on all packaged foods and beverages provide calories per serving information. Make a point of reading the labels of the foods and drinks you use, noting the number of calories and the serving sizes. Many recipes published in cookbooks, newspapers, and magazines provide similar information.  If you hate counting calories, a different approach is to restrict  how much and how often you eat, and to eat meals that are low in calories. Dietary guidelines issued by the American Heart Association stress common sense in choosing your foods rather than focusing strictly on numbers, such as total calories or calories from fat. Whichever method you choose, research shows that a regular eating schedule -- with meals and snacks planned for certain times each day -- makes for the most successful approach. The same applies after you have lost weight and want to keep it off.  Sticking with an eating schedule increases your chance of maintaining your new weight.    This is  Dr. Melina Schools  ( an amazing physician in my office!)  example of a  "Low GI"  Diet:  It will allow you to lose 4 to 8  lbs  per month if you follow it carefully.  Your goal with exercise is a minimum of 30 minutes of aerobic exercise 5 days per week (Walking does not count once it becomes easy!)    All of the foods can be found at grocery stores and in bulk at [employer].  The Atkins protein bars and shakes are available in more varieties at Target, WalMart and [employer]'s Foods.     7 AM Breakfast:  Choose from the following:  Low carbohydrate Protein  Shakes (I recommend the  Premier Protein chocolate shakes,  EAS AdvantEdge "Carb Control" shakes  Or the Atkins shakes all are under 3 net carbs)     a scrambled egg/bacon/cheese burrito made with Mission's "carb balance" whole wheat tortilla  (about 10 net carbs )  Medical laboratory scientific officer (basically a quiche without the pastry crust) that is eaten cold and very convenient way to get your eggs.  8 carbs)  If you make your own protein shakes, avoid bananas and pineapple,  And use low carb greek yogurt or original /unsweetened almond or soy milk    Avoid cereal and bananas, oatmeal and cream of wheat and grits. They are loaded with carbohydrates!   10 AM: high protein snack:  Protein bar by Atkins (the snack size, under 200 cal, usually < 6 net carbs).    A stick of cheese:  Around 1 carb,  100 cal     Dannon Light n Fit [place] Yogurt  (80 cal, 8 carbs)  Other so called "protein bars" and Greek yogurts tend to be loaded with carbohydrates.  Remember, in food advertising, the word "energy" is synonymous for " carbohydrate."  Lunch:   A Sandwich using the bread choices listed, Can use any  Eggs,  lunchmeat, grilled meat or canned tuna), avocado, regular mayo/mustard  and cheese.  A Salad using blue cheese, ranch,  Goddess or vinagrette,   Avoid taco shells, croutons or "confetti" and no "candied nuts" but regular nuts OK.   No pretzels, nabs  or chips.  Pickles and miniature sweet peppers are a good low carb alternative that provide a "crunch"  The bread is the only source of carbohydrate in a sandwich and  can be decreased by trying some of the attached alternatives to traditional loaf bread   Avoid "Low fat dressings, as well as Reyne Dumas and [employer] dressings They are loaded with sugar!   3 PM/ Mid day  Snack:  Consider  1 ounce of  almonds, walnuts, pistachios, pecans, peanuts,  Macadamia nuts or a nut medley.  Avoid "granola and granola bars "  Mixed nuts are ok in moderation as long as  there are no raisins,  cranberries or dried fruit.   KIND bars are OK if you get the low glycemic index variety   Try the prosciutto/mozzarella cheese sticks by Fiorruci  In deli /backery section   High protein      6 PM  Dinner:     Meat/fowl/fish with a green salad, and either broccoli, cauliflower, green beans, spinach, brussel sprouts or  Lima beans. DO NOT BREAD THE PROTEIN!!      There is a low carb pasta by Dreamfield's that is acceptable and tastes great: only 5 digestible carbs/serving.( All grocery stores but BJs carry it ) Several ready made meals are available low carb:   Try Michel Angelo's chicken piccata or chicken or eggplant parm over low carb pasta.(Lowes and BJs)   Clifton Custard Sanchez's "Carnitas" (pulled pork, no sauce,  0 carbs) or his beef pot roast to make a dinner burrito (at BJ's)  Pesto over low carb pasta (bj's sells a good quality pesto in the center refrigerated section of the deli   Try satueeing  Roosvelt Harps with mushroooms as a good side   Green Giant makes a mashed cauliflower that tastes like mashed potatoes  Whole wheat pasta is still full of digestible carbs and  Not as low in glycemic index as Dreamfield's.   Brown rice is still rice,  So skip the rice and noodles if you eat [place] or [place] (or at least  limit to 1/2 cup)  9 PM snack :   Breyer's "low carb" fudgsicle or  ice cream bar (Carb Smart line), or  Weight Watcher's ice cream bar , or another "no sugar added" ice cream;  a serving of fresh berries/cherries with whipped cream   Cheese or DANNON'S LlGHT N FIT GREEK YOGURT  8 ounces of [place] unsweetened almond/cococunut milk    Treat yourself to a parfait made with whipped cream blueberiies, walnuts and vanilla greek yogurt  Avoid bananas, pineapple, grapes  and watermelon on a regular basis because they are high in sugar.  THINK OF THEM AS DESSERT  Remember that snack Substitutions should be less than 10 NET carbs per serving and meals < 20 carbs. Remember to subtract fiber grams to get the "net carbs."

## 2023-06-17 NOTE — Assessment & Plan Note (Addendum)
Pending lipid panel.  Continue Lipitor 10 mg daily.  Counseled on low glycemic diet and creating caloric deficit.

## 2023-06-17 NOTE — Progress Notes (Signed)
Assessment & Plan:  Hyperlipidemia, unspecified hyperlipidemia type Assessment & Plan: Pending lipid panel.  Continue Lipitor 10 mg daily.  Counseled on low glycemic diet and creating caloric deficit.  Orders: -     Comprehensive metabolic panel -     Lipid panel  Elevated glucose -     POCT glycosylated hemoglobin (Hb A1C)  Encounter for screening mammogram for malignant neoplasm of breast -     3D Screening Mammogram, Left and Right; Future  Osteoporosis, unspecified osteoporosis type, unspecified pathological fracture presence -     Comprehensive metabolic panel -     VITAMIN D 25 Hydroxy (Vit-D Deficiency, Fractures)  Abnormal laboratory test -     CBC with Differential/Platelet     Return precautions given.   Risks, benefits, and alternatives of the medications and treatment plan prescribed today were discussed, and patient expressed understanding.   Education regarding symptom management and diagnosis given to patient on AVS either electronically or printed.  Return in about 1 year (around 06/16/2024).  Brittany Plowman, FNP  Subjective:    Patient ID: Brittany Farley, female    DOB: [birth year], 72 y.o.   MRN: [id number]  CC: Brittany Farley is a 72 y.o. female who presents today for follow up.   HPI: Feels well today.  Somewhat frustrated by weight at this time.  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  She is still swimming 3 hours 5 days a week.  Denies hip pain.  Compliant with Lipitor daily    Allergies: Patient has no known allergies. Current Outpatient Medications on File Prior to Visit  Medication Sig Dispense Refill   alendronate (FOSAMAX) 70 MG tablet Take 1 tablet (70 mg total) by mouth once a week. Take with a full glass of water on an empty stomach. 12 tablet 3   aspirin EC 81 MG tablet Take 81 mg by mouth daily.     atorvastatin (LIPITOR) 10 MG tablet TAKE 1 TABLET BY MOUTH  DAILY 100 tablet 2   Calcium Carbonate-Vit D-Min (CALCIUM 1200 PO) Take by mouth in the morning and at bedtime.     Cholecalciferol (VITAMIN D3) 2000 units TABS Take by mouth.     Multiple Vitamin (MULTIVITAMIN ADULT PO) Take by mouth.     No current facility-administered medications on file prior to visit.    Review of Systems  Constitutional:  Negative for chills and fever.  Respiratory:  Negative for cough.   Cardiovascular:  Negative for chest pain and palpitations.  Gastrointestinal:  Negative for nausea and vomiting.      Objective:    BP 134/72   Pulse 65   Temp 98 F (36.7 C) (Oral)   Ht 5\' 4"  (1.626 m)   Wt 162 lb 12.8 oz (73.8 kg)   SpO2 97%   BMI 27.94 kg/m  BP Readings from Last 3 Encounters:  06/17/23 134/72  01/23/23 124/72  10/28/22 (!) 164/85   Wt Readings from Last 3 Encounters:  06/17/23 162 lb 12.8 oz (73.8 kg)  01/23/23 162 lb (73.5 kg)  10/28/22 164 lb (74.4 kg)    Physical Exam Vitals reviewed.  Constitutional:      Appearance: She is well-developed.  Eyes:     Conjunctiva/sclera: Conjunctivae normal.  Cardiovascular:     Rate and Rhythm: Normal rate and regular rhythm.     Pulses: Normal pulses.     Heart sounds: Normal heart sounds.  Pulmonary:     Effort: Pulmonary effort is normal.     Breath sounds: Normal breath sounds. No wheezing, rhonchi or rales.  Skin:    General: Skin is warm and dry.  Neurological:     Mental Status: She is alert.  Psychiatric:        Speech: Speech normal.        Behavior: Behavior normal.        Thought Content: Thought content normal.

## 2023-06-22 NOTE — Addendum Note (Signed)
Addended by: [place],  on: 06/22/2023 10:47 AM   Modules accepted: Orders

## 2023-06-25 ENCOUNTER — Encounter (INDEPENDENT_AMBULATORY_CARE_PROVIDER_SITE_OTHER): Payer: Self-pay

## 2023-07-24 ENCOUNTER — Ambulatory Visit (INDEPENDENT_AMBULATORY_CARE_PROVIDER_SITE_OTHER): Payer: Medicare Other | Admitting: Family

## 2023-07-24 ENCOUNTER — Encounter: Payer: Self-pay | Admitting: Family

## 2023-07-24 VITALS — BP 130/68 | HR 64 | Temp 98.1°F | Ht 64.0 in | Wt 162.0 lb

## 2023-07-24 DIAGNOSIS — H6123 Impacted cerumen, bilateral: Secondary | ICD-10-CM | POA: Diagnosis not present

## 2023-07-24 NOTE — Patient Instructions (Addendum)
Do not use Q-tips in the ear.    Start Debrox ear drops over the counter and follow instructions on bottle for the next few days.  Please make a nurse visit in the office to schedule irrigation of your ears   Nice to see you!

## 2023-07-24 NOTE — Progress Notes (Unsigned)
Assessment & Plan:  There are no diagnoses linked to this encounter.   Return precautions given.   Risks, benefits, and alternatives of the medications and treatment plan prescribed today were discussed, and patient expressed understanding.   Education regarding symptom management and diagnosis given to patient on AVS either electronically or printed.  No follow-ups on file.  [name], FNP  Subjective:    Patient ID: Brittany Farley, female    DOB: 1950/12/25, 72 y.o.   MRN: [id number]  CC: Brittany Farley is a 72 y.o. female who presents today for an acute visit.    HPI: Complains of bilateral tinnitus, worse in right, x 2 weeks, acute onset.  S'  More noticeable in the evenings when at home.  Denies hearing loss, vision changes, HA, pulsatile tinniuts.        Allergies: Patient has no known allergies. Current Outpatient Medications on File Prior to Visit  Medication Sig Dispense Refill   alendronate (FOSAMAX) 70 MG tablet Take 1 tablet (70 mg total) by mouth once a week. Take with a full glass of water on an empty stomach. 12 tablet 3   aspirin EC 81 MG tablet Take 81 mg by mouth daily.     atorvastatin (LIPITOR) 10 MG tablet TAKE 1 TABLET BY MOUTH DAILY 100 tablet 2   Calcium Carbonate-Vit D-Min (CALCIUM 1200 PO) Take by mouth in the morning and at bedtime.     Cholecalciferol (VITAMIN D3) 2000 units TABS Take by mouth.     Multiple Vitamin (MULTIVITAMIN ADULT PO) Take by mouth.     No current facility-administered medications on file prior to visit.    Review of Systems    Objective:    BP 130/68   Pulse 64   Temp 98.1 F (36.7 C) (Oral)   Ht 5\' 4"  (1.626 m)   Wt 162 lb (73.5 kg)   SpO2 96%   BMI 27.81 kg/m   BP Readings from Last 3 Encounters:  07/24/23 130/68  06/17/23 134/72  01/23/23 124/72   Wt Readings from Last 3 Encounters:  07/24/23 162 lb (73.5 kg)  06/17/23 162 lb 12.8 oz (73.8 kg)  01/23/23 162 lb (73.5 kg)    Physical Exam

## 2023-07-27 DIAGNOSIS — H6120 Impacted cerumen, unspecified ear: Secondary | ICD-10-CM | POA: Insufficient documentation

## 2023-07-27 NOTE — Assessment & Plan Note (Signed)
Cerumen impaction on exam.  Advised Debrox over-the-counter and patient will follow-up with nurse visit for irrigation in the office.  Discussed various etiologies of tinnitus.  Fortunately no pulsatile quality at this time.  No associated hearing loss, URI symptoms, or known hearing damage.  We discussed lifestyle measures including a sound machine.  If symptoms persist after irrigation, we will arrange ENT consult

## 2023-07-28 ENCOUNTER — Ambulatory Visit: Payer: Medicare Other

## 2023-08-05 ENCOUNTER — Encounter: Payer: Self-pay | Admitting: *Deleted

## 2023-08-05 ENCOUNTER — Telehealth: Payer: Self-pay | Admitting: Family

## 2023-08-05 NOTE — Telephone Encounter (Signed)
Prescription Request  08/05/2023  LOV: 07/24/2023  What is the name of the medication or equipment? Ear drops  Have you contacted your pharmacy to request a refill? yes  Which pharmacy would you like this sent to?  [facility] 800 [phone]  Patient notified that their request is being sent to the clinical staff for review and that they should receive a response within 2 business days.   Please advise at Mobile [phone] (mobile)  Pt want a script order to [facility]

## 2023-08-06 ENCOUNTER — Ambulatory Visit: Payer: Medicare Other

## 2023-08-20 ENCOUNTER — Telehealth: Payer: Self-pay | Admitting: Family

## 2023-08-20 NOTE — Telephone Encounter (Signed)
Patient just called and wants to know if she can get a hearing test scheduled. Her number is [phone]. She would like to speak with someone about this.

## 2023-08-21 NOTE — Telephone Encounter (Signed)
Spoke with patient, scheduled nurse visit to irrigate ear per MyChart message instructions.

## 2023-08-26 ENCOUNTER — Ambulatory Visit (INDEPENDENT_AMBULATORY_CARE_PROVIDER_SITE_OTHER): Payer: Medicare Other | Admitting: Nurse Practitioner

## 2023-08-26 VITALS — BP 120/78 | HR 60 | Temp 98.2°F | Ht 64.0 in | Wt 159.8 lb

## 2023-08-26 DIAGNOSIS — H9311 Tinnitus, right ear: Secondary | ICD-10-CM | POA: Diagnosis not present

## 2023-08-26 DIAGNOSIS — H6123 Impacted cerumen, bilateral: Secondary | ICD-10-CM

## 2023-08-26 NOTE — Progress Notes (Signed)
Bethanie Dicker, NP-C Phone: [phone]  Brittany Farley is a 72 y.o. female who presents today for ear pain.   Patient with right ear pain that started yesterday. She was evaluated last month for tinnitus and noted to have bilateral cerumen impaction at that time. She was advised to use OTC Debrox and return for irrigation. She just started using the Debrox 3 days ago. Denies fevers. Denies URI symptoms. She has continued to have tinnitus and is requesting further evaluation.   Social History   Tobacco Use  Smoking Status Never  Smokeless Tobacco Never    Current Outpatient Medications on File Prior to Visit  Medication Sig Dispense Refill   alendronate (FOSAMAX) 70 MG tablet Take 1 tablet (70 mg total) by mouth once a week. Take with a full glass of water on an empty stomach. 12 tablet 3   aspirin EC 81 MG tablet Take 81 mg by mouth daily.     atorvastatin (LIPITOR) 10 MG tablet TAKE 1 TABLET BY MOUTH DAILY 100 tablet 2   Calcium Carbonate-Vit D-Min (CALCIUM 1200 PO) Take by mouth in the morning and at bedtime.     Cholecalciferol (VITAMIN D3) 2000 units TABS Take by mouth.     Multiple Vitamin (MULTIVITAMIN ADULT PO) Take by mouth.     No current facility-administered medications on file prior to visit.     ROS see history of present illness  Objective  Physical Exam Vitals:   08/26/23 1542  BP: 120/78  Pulse: 60  Temp: 98.2 F (36.8 C)  SpO2: 100%    BP Readings from Last 3 Encounters:  08/26/23 120/78  07/24/23 130/68  06/17/23 134/72   Wt Readings from Last 3 Encounters:  08/26/23 159 lb 12.8 oz (72.5 kg)  07/24/23 162 lb (73.5 kg)  06/17/23 162 lb 12.8 oz (73.8 kg)    Physical Exam Constitutional:      General: She is not in acute distress.    Appearance: Normal appearance.  HENT:     Head: Normocephalic.     Right Ear: There is impacted cerumen.     Left Ear: There is impacted cerumen.  Cardiovascular:     Rate and Rhythm: Normal rate and regular  rhythm.     Heart sounds: Normal heart sounds.  Pulmonary:     Effort: Pulmonary effort is normal.     Breath sounds: Normal breath sounds.  Skin:    General: Skin is warm and dry.  Neurological:     General: No focal deficit present.     Mental Status: She is alert.  Psychiatric:        Mood and Affect: Mood normal.        Behavior: Behavior normal.   Ear Cerumen Removal  Date/Time: 08/26/2023 3:52 PM  Performed by: Donavan Foil, CMA Authorized by: Bethanie Dicker, NP   Anesthesia: Local Anesthetic: none Location details: right ear and left ear Patient tolerance: patient tolerated the procedure well with no immediate complications Comments: Consent obtained. TM visualized after removal- WNL.  Procedure type: irrigation  Sedation: Patient sedated: no     Assessment/Plan: Please see individual problem list.  Bilateral impacted cerumen Assessment & Plan: Removed easily with irrigation. TM visualized after removal- WNL. Return precautions given to patient.   Orders: -     Ear Cerumen Removal  Tinnitus of right ear Assessment & Plan: Discussed with patient this could have been due to her impacted cerumen. She would like to proceed with ENT referral and Audiology  eval. Referral placed.   Orders: -     Ambulatory referral to ENT   Return if symptoms worsen or fail to improve.   Bethanie Dicker, NP-C [facility] [employer]

## 2023-08-26 NOTE — Assessment & Plan Note (Signed)
Discussed with patient this could have been due to her impacted cerumen. She would like to proceed with ENT referral and Audiology eval. Referral placed.

## 2023-08-26 NOTE — Assessment & Plan Note (Addendum)
Removed easily with irrigation. TM visualized after removal- WNL. Return precautions given to patient.

## 2023-08-27 ENCOUNTER — Ambulatory Visit: Payer: Medicare Other

## 2023-08-31 ENCOUNTER — Encounter: Payer: Self-pay | Admitting: Family

## 2023-08-31 ENCOUNTER — Ambulatory Visit: Payer: Medicare Other | Admitting: Family

## 2023-08-31 VITALS — BP 130/80 | HR 59 | Temp 97.8°F | Resp 17 | Ht 63.5 in | Wt 160.5 lb

## 2023-08-31 DIAGNOSIS — Z0000 Encounter for general adult medical examination without abnormal findings: Secondary | ICD-10-CM

## 2023-08-31 DIAGNOSIS — K760 Fatty (change of) liver, not elsewhere classified: Secondary | ICD-10-CM

## 2023-08-31 DIAGNOSIS — R7309 Other abnormal glucose: Secondary | ICD-10-CM

## 2023-08-31 LAB — CBC WITH DIFFERENTIAL/PLATELET
Basophils Absolute: 0 10*3/uL (ref 0.0–0.1)
Basophils Relative: 1 % (ref 0.0–3.0)
Eosinophils Absolute: 0.3 10*3/uL (ref 0.0–0.7)
Eosinophils Relative: 6 % — ABNORMAL HIGH (ref 0.0–5.0)
HCT: 42.7 % (ref 36.0–46.0)
Hemoglobin: 13.8 g/dL (ref 12.0–15.0)
Lymphocytes Relative: 37.2 % (ref 12.0–46.0)
Lymphs Abs: 1.8 10*3/uL (ref 0.7–4.0)
MCHC: 32.4 g/dL (ref 30.0–36.0)
MCV: 78.1 fL (ref 78.0–100.0)
Monocytes Absolute: 0.4 10*3/uL (ref 0.1–1.0)
Monocytes Relative: 8.5 % (ref 3.0–12.0)
Neutro Abs: 2.3 10*3/uL (ref 1.4–7.7)
Neutrophils Relative %: 47.3 % (ref 43.0–77.0)
Platelets: 365 10*3/uL (ref 150.0–400.0)
RBC: 5.46 Mil/uL — ABNORMAL HIGH (ref 3.87–5.11)
RDW: 13.8 % (ref 11.5–15.5)
WBC: 5 10*3/uL (ref 4.0–10.5)

## 2023-08-31 LAB — HEPATIC FUNCTION PANEL
ALT: 27 U/L (ref 0–35)
AST: 39 U/L — ABNORMAL HIGH (ref 0–37)
Albumin: 4 g/dL (ref 3.5–5.2)
Alkaline Phosphatase: 92 U/L (ref 39–117)
Bilirubin, Direct: 0.1 mg/dL (ref 0.0–0.3)
Total Bilirubin: 0.4 mg/dL (ref 0.2–1.2)
Total Protein: 7.4 g/dL (ref 6.0–8.3)

## 2023-08-31 LAB — COMPREHENSIVE METABOLIC PANEL
ALT: 27 U/L (ref 0–35)
AST: 39 U/L — ABNORMAL HIGH (ref 0–37)
Albumin: 4 g/dL (ref 3.5–5.2)
Alkaline Phosphatase: 92 U/L (ref 39–117)
BUN: 27 mg/dL — ABNORMAL HIGH (ref 6–23)
CO2: 30 meq/L (ref 19–32)
Calcium: 9.4 mg/dL (ref 8.4–10.5)
Chloride: 100 meq/L (ref 96–112)
Creatinine, Ser: 0.71 mg/dL (ref 0.40–1.20)
GFR: 83.94 mL/min (ref >60.00)
Glucose, Bld: 91 mg/dL (ref 70–99)
Potassium: 3.9 meq/L (ref 3.5–5.1)
Sodium: 137 meq/L (ref 135–145)
Total Bilirubin: 0.4 mg/dL (ref 0.2–1.2)
Total Protein: 7.4 g/dL (ref 6.0–8.3)

## 2023-08-31 LAB — MICROALBUMIN / CREATININE URINE RATIO
Creatinine,U: 34.7 mg/dL
Microalb Creat Ratio: 2 mg/g (ref 0.0–30.0)
Microalb, Ur: <0.7 mg/dL (ref 0.0–1.9)

## 2023-08-31 NOTE — Patient Instructions (Signed)
Health Maintenance for Postmenopausal Women Menopause is a normal process in which your ability to get pregnant comes to an end. This process happens slowly over many months or years, usually between the ages of 48 and 55. Menopause is complete when you have missed your menstrual period for 12 months. It is important to talk with your health care provider about some of the most common conditions that affect women after menopause (postmenopausal women). These include heart disease, cancer, and bone loss (osteoporosis). Adopting a healthy lifestyle and getting preventive care can help to promote your health and wellness. The actions you take can also lower your chances of developing some of these common conditions. What are the signs and symptoms of menopause? During menopause, you may have the following symptoms: Hot flashes. These can be moderate or severe. Night sweats. Decrease in sex drive. Mood swings. Headaches. Tiredness (fatigue). Irritability. Memory problems. Problems falling asleep or staying asleep. Talk with your health care provider about treatment options for your symptoms. Do I need hormone replacement therapy? Hormone replacement therapy is effective in treating symptoms that are caused by menopause, such as hot flashes and night sweats. Hormone replacement carries certain risks, especially as you become older. If you are thinking about using estrogen or estrogen with progestin, discuss the benefits and risks with your health care provider. How can I reduce my risk for heart disease and stroke? The risk of heart disease, heart attack, and stroke increases as you age. One of the causes may be a change in the body's hormones during menopause. This can affect how your body uses dietary fats, triglycerides, and cholesterol. Heart attack and stroke are medical emergencies. There are many things that you can do to help prevent heart disease and stroke. Watch your blood pressure High  blood pressure causes heart disease and increases the risk of stroke. This is more likely to develop in people who have high blood pressure readings or are overweight. Have your blood pressure checked: Every 3-5 years if you are 18-39 years of age. Every year if you are 40 years old or older. Eat a healthy diet  Eat a diet that includes plenty of vegetables, fruits, low-fat dairy products, and lean protein. Do not eat a lot of foods that are high in solid fats, added sugars, or sodium. Get regular exercise Get regular exercise. This is one of the most important things you can do for your health. Most adults should: Try to exercise for at least 150 minutes each week. The exercise should increase your heart rate and make you sweat (moderate-intensity exercise). Try to do strengthening exercises at least twice each week. Do these in addition to the moderate-intensity exercise. Spend less time sitting. Even light physical activity can be beneficial. Other tips Work with your health care provider to achieve or maintain a healthy weight. Do not use any products that contain nicotine or tobacco. These products include cigarettes, chewing tobacco, and vaping devices, such as e-cigarettes. If you need help quitting, ask your health care provider. Know your numbers. Ask your health care provider to check your cholesterol and your blood sugar (glucose). Continue to have your blood tested as directed by your health care provider. Do I need screening for cancer? Depending on your health history and family history, you may need to have cancer screenings at different stages of your life. This may include screening for: Breast cancer. Cervical cancer. Lung cancer. Colorectal cancer. What is my risk for osteoporosis? After menopause, you may be   at increased risk for osteoporosis. Osteoporosis is a condition in which bone destruction happens more quickly than new bone creation. To help prevent osteoporosis or  the bone fractures that can happen because of osteoporosis, you may take the following actions: If you are 19-50 years old, get at least 1,000 mg of calcium and at least 600 international units (IU) of vitamin D per day. If you are older than age 50 but younger than age 70, get at least 1,200 mg of calcium and at least 600 international units (IU) of vitamin D per day. If you are older than age 70, get at least 1,200 mg of calcium and at least 800 international units (IU) of vitamin D per day. Smoking and drinking excessive alcohol increase the risk of osteoporosis. Eat foods that are rich in calcium and vitamin D, and do weight-bearing exercises several times each week as directed by your health care provider. How does menopause affect my mental health? Depression may occur at any age, but it is more common as you become older. Common symptoms of depression include: Feeling depressed. Changes in sleep patterns. Changes in appetite or eating patterns. Feeling an overall lack of motivation or enjoyment of activities that you previously enjoyed. Frequent crying spells. Talk with your health care provider if you think that you are experiencing any of these symptoms. General instructions See your health care provider for regular wellness exams and vaccines. This may include: Scheduling regular health, dental, and eye exams. Getting and maintaining your vaccines. These include: Influenza vaccine. Get this vaccine each year before the flu season begins. Pneumonia vaccine. Shingles vaccine. Tetanus, diphtheria, and pertussis (Tdap) booster vaccine. Your health care provider may also recommend other immunizations. Tell your health care provider if you have ever been abused or do not feel safe at home. Summary Menopause is a normal process in which your ability to get pregnant comes to an end. This condition causes hot flashes, night sweats, decreased interest in sex, mood swings, headaches, or lack  of sleep. Treatment for this condition may include hormone replacement therapy. Take actions to keep yourself healthy, including exercising regularly, eating a healthy diet, watching your weight, and checking your blood pressure and blood sugar levels. Get screened for cancer and depression. Make sure that you are up to date with all your vaccines. This information is not intended to replace advice given to you by your health care provider. Make sure you discuss any questions you have with your health care provider. Document Revised: 03/18/2021 Document Reviewed: 03/18/2021 Elsevier Patient Education  2024 [employer]  

## 2023-08-31 NOTE — Progress Notes (Signed)
Assessment & Plan:  Routine physical examination Assessment & Plan: Clinical breast exam performed today.  Deferred pelvic exam in the absence of complaints and patient is a longer screening for cervical cancer.  Encouraged continued exercise  Orders: -     Microalbumin / creatinine urine ratio -     Comprehensive metabolic panel  Elevated glucose -     CBC with Differential/Platelet  Hepatic steatosis -     Hepatic function panel     Return precautions given.   Risks, benefits, and alternatives of the medications and treatment plan prescribed today were discussed, and patient expressed understanding.   Education regarding symptom management and diagnosis given to patient on AVS either electronically or printed.  Return for [facility] [employer] upcoming or due, schedule, Complete Physical Exam.  [name], FNP  Subjective:    Patient ID: Brittany Farley, female    DOB: November 02, 1951, 72 y.o.   MRN: [id number]  CC: Brittany Farley is a 72 y.o. female who presents today for physical exam.    HPI: Feels well today.  No new complaints    Colorectal Cancer Screening: UTD, Dr Maximino Greenland; repeat in 10 years Breast Cancer Screening: Mammogram  scheduled Cervical Cancer Screening: History of hysterectomy due to fibroids.  No longer screening for cervical cancer; ho normal pap smear per patient.  Bone Health screening/DEXA for 65+: UTD; known history of osteopenia  Lung Cancer Screening: Doesn't have 20 year pack year history and age > 64 years yo 78 years       Tetanus - UTD Exercise: Gets regular exercise.   Alcohol use: Occasional Smoking/tobacco use: Nonsmoker.    Health Maintenance  Topic Date Due   Medicare Annual Wellness Visit  07/26/2023   COVID-19 Vaccine (7 - 2023-24 season) 10/02/2023   Mammogram  09/12/2024   DTaP/Tdap/Td vaccine (2 - Td or Tdap) 03/18/2027   Colon Cancer Screening  11/29/2028   Pneumonia Vaccine  Completed   Flu Shot  Completed   DEXA scan (bone  density measurement)  Completed   Hepatitis C Screening  Completed   Zoster (Shingles) Vaccine  Completed   HPV Vaccine  Aged Out    ALLERGIES: Patient has no known allergies.  Current Outpatient Medications on File Prior to Visit  Medication Sig Dispense Refill   alendronate (FOSAMAX) 70 MG tablet Take 1 tablet (70 mg total) by mouth once a week. Take with a full glass of water on an empty stomach. 12 tablet 3   aspirin EC 81 MG tablet Take 81 mg by mouth daily.     atorvastatin (LIPITOR) 10 MG tablet TAKE 1 TABLET BY MOUTH DAILY 100 tablet 2   Calcium Carbonate-Vit D-Min (CALCIUM 1200 PO) Take by mouth in the morning and at bedtime.     Cholecalciferol (VITAMIN D3) 2000 units TABS Take by mouth.     Multiple Vitamin (MULTIVITAMIN ADULT PO) Take by mouth.     No current facility-administered medications on file prior to visit.    Review of Systems  Constitutional:  Negative for chills, fever and unexpected weight change.  HENT:  Negative for congestion.   Respiratory:  Negative for cough.   Cardiovascular:  Negative for chest pain, palpitations and leg swelling.  Gastrointestinal:  Negative for nausea and vomiting.  Musculoskeletal:  Negative for arthralgias and myalgias.  Skin:  Negative for rash.  Neurological:  Negative for headaches.  Hematological:  Negative for adenopathy.  Psychiatric/Behavioral:  Negative for confusion.       Objective:  BP 130/80   Pulse (!) 59   Temp 97.8 F (36.6 C) (Oral)   Resp 17   Ht 5' 3.5" (1.613 m)   Wt 160 lb 8 oz (72.8 kg)   SpO2 99%   BMI 27.99 kg/m   BP Readings from Last 3 Encounters:  08/31/23 130/80  08/26/23 120/78  07/24/23 130/68   Wt Readings from Last 3 Encounters:  08/31/23 160 lb 8 oz (72.8 kg)  08/26/23 159 lb 12.8 oz (72.5 kg)  07/24/23 162 lb (73.5 kg)    Physical Exam Vitals reviewed.  Constitutional:      Appearance: Normal appearance. She is well-developed.  Eyes:     Conjunctiva/sclera:  Conjunctivae normal.  Neck:     Thyroid: No thyroid mass or thyromegaly.  Cardiovascular:     Rate and Rhythm: Normal rate and regular rhythm.     Pulses: Normal pulses.     Heart sounds: Normal heart sounds.  Pulmonary:     Effort: Pulmonary effort is normal.     Breath sounds: Normal breath sounds. No wheezing, rhonchi or rales.  Chest:  Breasts:    Breasts are symmetrical.     Right: No inverted nipple, mass, nipple discharge, skin change or tenderness.     Left: No inverted nipple, mass, nipple discharge, skin change or tenderness.  Abdominal:     General: Bowel sounds are normal. There is no distension.     Palpations: Abdomen is soft. Abdomen is not rigid. There is no fluid wave or mass.     Tenderness: There is no abdominal tenderness. There is no guarding or rebound.  Lymphadenopathy:     Head:     Right side of head: No submental, submandibular, tonsillar, preauricular, posterior auricular or occipital adenopathy.     Left side of head: No submental, submandibular, tonsillar, preauricular, posterior auricular or occipital adenopathy.     Cervical: No cervical adenopathy.     Right cervical: No superficial, deep or posterior cervical adenopathy.    Left cervical: No superficial, deep or posterior cervical adenopathy.  Skin:    General: Skin is warm and dry.  Neurological:     Mental Status: She is alert.  Psychiatric:        Speech: Speech normal.        Behavior: Behavior normal.        Thought Content: Thought content normal.

## 2023-08-31 NOTE — Assessment & Plan Note (Signed)
Clinical breast exam performed today.  Deferred pelvic exam in the absence of complaints and patient is a longer screening for cervical cancer.  Encouraged continued exercise

## 2023-09-03 ENCOUNTER — Other Ambulatory Visit: Payer: Self-pay | Admitting: Family

## 2023-09-03 DIAGNOSIS — R899 Unspecified abnormal finding in specimens from other organs, systems and tissues: Secondary | ICD-10-CM

## 2023-09-04 ENCOUNTER — Ambulatory Visit: Payer: Medicare Other

## 2023-09-07 ENCOUNTER — Ambulatory Visit (INDEPENDENT_AMBULATORY_CARE_PROVIDER_SITE_OTHER): Payer: Medicare Other | Admitting: *Deleted

## 2023-09-07 VITALS — Ht 63.5 in | Wt 160.0 lb

## 2023-09-07 DIAGNOSIS — Z0000 Encounter for general adult medical examination without abnormal findings: Secondary | ICD-10-CM | POA: Diagnosis not present

## 2023-09-07 NOTE — Progress Notes (Signed)
Subjective:   Brittany Farley is a 72 y.o. female who presents for Medicare Annual (Subsequent) preventive examination.  Visit Complete: Virtual I connected with  Brittany Farley on 09/07/23 by a audio enabled telemedicine application and verified that I am speaking with the correct person using two identifiers.  Patient Location: Home  Provider Location: Office/Clinic  I discussed the limitations of evaluation and management by telemedicine. The patient expressed understanding and agreed to proceed.  Vital Signs: Because this visit was a virtual/telehealth visit, some criteria may be missing or patient reported. Any vitals not documented were not able to be obtained and vitals that have been documented are patient reported.  Patient Medicare AWV questionnaire was completed by the patient on 09/06/23; I have confirmed that all information answered by patient is correct and no changes since this date.  Cardiac Risk Factors include: advanced age (>68men, >34 women);dyslipidemia     Objective:    Today's Vitals   09/07/23 0817  Weight: 160 lb (72.6 kg)  Height: 5' 3.5" (1.613 m)   Body mass index is 27.9 kg/m.     09/07/2023    8:30 AM 07/25/2022    1:47 PM 11/28/2021    7:48 AM 11/21/2021    9:29 AM 04/15/2021    8:27 AM 04/02/2020   10:39 AM 02/20/2020    7:37 AM  Advanced Directives  Does Patient Have a Medical Advance Directive? No No Yes No No Yes Yes  Type of Advance Directive   Living will   Living will;Healthcare Power of [employer] Power of [place];Living will  Does patient want to make changes to medical advance directive?      No - Patient declined No - Patient declined  Copy of Healthcare Power of Attorney in Chart?      No - copy requested No - copy requested  Would patient like information on creating a medical advance directive? No - Patient declined No - Patient declined   No - Patient declined No - Patient declined     Current Medications  (verified) Outpatient Encounter Medications as of 09/07/2023  Medication Sig   alendronate (FOSAMAX) 70 MG tablet Take 1 tablet (70 mg total) by mouth once a week. Take with a full glass of water on an empty stomach.   aspirin EC 81 MG tablet Take 81 mg by mouth daily.   atorvastatin (LIPITOR) 10 MG tablet TAKE 1 TABLET BY MOUTH DAILY   Calcium Carbonate-Vit D-Min (CALCIUM 1200 PO) Take by mouth in the morning and at bedtime.   Cholecalciferol (VITAMIN D3) 2000 units TABS Take by mouth.   Multiple Vitamin (MULTIVITAMIN ADULT PO) Take by mouth.   No facility-administered encounter medications on file as of 09/07/2023.    Allergies (verified) Patient has no known allergies.   History: Past Medical History:  Diagnosis Date   Arthritis    right hand   Osteoporosis    Past Surgical History:  Procedure Laterality Date   ABDOMINAL HYSTERECTOMY  1980   had hysterectomy for fibroids. No cancer. Has both ovaries and cervix.   BARIATRIC SURGERY     late 60's   CATARACT EXTRACTION W/PHACO Right 02/20/2020   Procedure: CATARACT EXTRACTION PHACO AND INTRAOCULAR LENS PLACEMENT (IOC) RIGHT 2.33  00:24.3;  Surgeon: Nevada Crane, MD;  Location: [facility];  Service: Ophthalmology;  Laterality: Right;   CATARACT EXTRACTION W/PHACO Left 04/02/2020   Procedure: CATARACT EXTRACTION PHACO AND INTRAOCULAR LENS PLACEMENT (IOC) LEFT;  Surgeon: Nevada Crane, MD;  Location: [facility];  Service: Ophthalmology;  Laterality: Left;  1.68 0:28.8   CHOLECYSTECTOMY  2011   COLONOSCOPY WITH PROPOFOL N/A 11/29/2018   Procedure: COLONOSCOPY WITH PROPOFOL;  Surgeon: Pasty Spillers, MD;  Location: [facility];  Service: Endoscopy;  Laterality: N/A;   left total hip arthroplasty Left    10/29/2022, Dr Odis Luster   OOPHORECTOMY     REPLACEMENT TOTAL HIP W/  RESURFACING IMPLANTS Right 05/2022   right total hip arthroplasty Right    Dr Odis Luster 05/2022   TUMOR EXCISION N/A 11/28/2021    Procedure: TRANSRECTAL TUMOR EXCISION, polyp;  Surgeon: Leafy Ro, MD;  Location: [facility];  Service: General;  Laterality: N/A;   Family History  Problem Relation Age of Onset   Breast cancer Mother 18   Breast cancer Maternal Aunt 16   Lung cancer Sister    Diabetes Sister    Prostate cancer Brother    Heart disease Brother    Stroke Cousin    Hypertension Cousin    Social History   Socioeconomic History   Marital status: Single    Spouse name: Not on file   Number of children: Not on file   Years of education: Not on file   Highest education level: Master's degree (e.g., MA, MS, MEng, MEd, MSW, MBA)  Occupational History   Not on file  Tobacco Use   Smoking status: Never   Smokeless tobacco: Never  Vaping Use   Vaping status: Never Used  Substance and Sexual Activity   Alcohol use: Not Currently    Comment: occasional - 1-2 drinks/year   Drug use: Never   Sexual activity: Not on file  Other Topics Concern   Not on file  Social History Narrative   From [place] 2018; Moved to [place]; Retired      Enjoys spinning,dancing however no longer spinning after hip replacement      Teach water aerobics; 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       Social Determinants of Health   Financial Resource Strain: Low Risk  (09/06/2023)   Overall Financial Resource Strain (CARDIA)    Difficulty of Paying Living Expenses: Not hard at all  Food Insecurity: No Food Insecurity (09/06/2023)   Hunger Vital Sign    Worried About Running Out of Food in the Last Year: Never true    Ran Out of Food in the Last Year: Never true  Transportation Needs: No Transportation Needs (09/06/2023)   PRAPARE - Administrator, Civil Service (Medical): No    Lack of Transportation (Non-Medical): No  Physical Activity: Sufficiently Active (09/06/2023)   Exercise Vital Sign    Days of Exercise per Week: 5 days    Minutes of Exercise per Session: 90 min  Stress: No Stress Concern  Present (09/07/2023)   [employer] of Occupational Health - Occupational Stress Questionnaire    Feeling of Stress : Not at all  Social Connections: Moderately Integrated (09/06/2023)   Social Connection and Isolation Panel [NHANES]    Frequency of Communication with Friends and Family: More than three times a week    Frequency of Social Gatherings with Friends and Family: Three times a week    Attends Religious Services: More than 4 times per year    Active Member of Clubs or Organizations: Yes    Attends Banker Meetings: 1 to 4 times per year    Marital Status: Divorced    Tobacco Counseling Counseling given: Not Answered  Clinical Intake:  Pre-visit preparation completed: Yes  Pain : No/denies pain     BMI - recorded: 27.9 Nutritional Status: BMI 25 -29 Overweight Nutritional Risks: None Diabetes: No  How often do you need to have someone help you when you read instructions, pamphlets, or other written materials from your doctor or pharmacy?: 1 - Never  Interpreter Needed?: No  Comments: R. Nilesh Stegall LPN   Activities of Daily Living    09/06/2023    2:50 PM  In your present state of health, do you have any difficulty performing the following activities:  Hearing? 0  Vision? 0  Difficulty concentrating or making decisions? 0  Walking or climbing stairs? 0  Dressing or bathing? 0  Doing errands, shopping? 0  Preparing Food and eating ? N  Using the Toilet? N  In the past six months, have you accidently leaked urine? N  Do you have problems with loss of bowel control? N  Managing your Medications? N  Managing your Finances? N  Housekeeping or managing your Housekeeping? N    Patient Care Team: Allegra Grana, FNP as PCP - General (Family Medicine) Marykay Lex, MD as PCP - Cardiology (Cardiology) Lyndle Herrlich, MD as Consulting Physician (Orthopedic Surgery)  Indicate any recent Medical Services you may have received from other  than [facility] providers in the past year (date may be approximate).     Assessment:   This is a routine wellness examination for [facility].  Hearing/Vision screen Hearing Screening - Comments:: No issues Vision Screening - Comments:: No glasses   Goals Addressed             This Visit's Progress    Patient Stated       Wants to continue to be more conscience of what she eats, wants to lose 10 more pounds       Depression Screen    09/07/2023    8:22 AM 08/31/2023    8:11 AM 08/31/2023    8:10 AM 07/24/2023   10:35 AM 06/17/2023    8:25 AM 10/20/2022   10:06 AM 07/25/2022    1:46 PM  PHQ 2/9 Scores  PHQ - 2 Score 0 0 0 0 0 0 0  PHQ- 9 Score 0 0  0 0      Fall Risk    09/06/2023    2:50 PM 08/31/2023    8:10 AM 07/24/2023   10:34 AM 06/17/2023    8:25 AM 10/20/2022   10:05 AM  Fall Risk   Falls in the past year? 0 0 0 0 0  Number falls in past yr: 0 0 0 0 0  Injury with Fall? 0 0 0  0  Risk for fall due to : No Fall Risks No Fall Risks No Fall Risks No Fall Risks No Fall Risks  Follow up Falls prevention discussed;Falls evaluation completed  Falls evaluation completed Falls evaluation completed Falls evaluation completed    MEDICARE RISK AT HOME: Medicare Risk at Home Any stairs in or around the home?: No If so, are there any without handrails?: No Home free of loose throw rugs in walkways, pet beds, electrical cords, etc?: Yes Adequate lighting in your home to reduce risk of falls?: Yes Life alert?: Yes Use of a cane, walker or w/c?: No Grab bars in the bathroom?: Yes Shower chair or bench in shower?: Yes Elevated toilet seat or a handicapped toilet?: Yes  Cognitive Function:        09/07/2023  8:31 AM 07/25/2022    1:56 PM  6CIT Screen  What Year? 0 points   What month? 0 points   What time? 0 points   Count back from 20 0 points   Months in reverse 0 points 0 points  Repeat phrase 0 points   Total Score 0 points     Immunizations Immunization  History  Administered Date(s) Administered   Fluad Quad(high Dose 65+) 07/13/2019, 07/20/2020, 08/07/2021, 08/25/2022, 08/07/2023   Hep A / Hep B 05/04/2023   Hepatitis A, Adult 10/28/2022, 12/01/2022   Influenza, High Dose Seasonal PF 08/25/2017, 09/03/2018   PFIZER Comirnaty(Gray Top)Covid-19 Tri-Sucrose Vaccine 02/16/2021   PFIZER(Purple Top)SARS-COV-2 Vaccination 12/08/2019, 12/30/2019, 08/10/2020   PNEUMOCOCCAL CONJUGATE-20 09/03/2022   Pfizer Covid-19 Vaccine Bivalent Booster 27yrs & up 08/07/2021, 08/07/2023   Pneumococcal Conjugate-13 04/01/2017   Pneumococcal-Unspecified 10/23/2015   Tdap 03/17/2017   Zoster Recombinant(Shingrix) 04/03/2017, 06/03/2017    TDAP status: Up to date  Flu Vaccine status: Up to date  Pneumococcal vaccine status: Up to date  Covid-19 vaccine status: Completed vaccines  Qualifies for Shingles Vaccine? Yes   Zostavax completed No   Shingrix Completed?: Yes  Screening Tests Health Maintenance  Topic Date Due   Medicare Annual Wellness (AWV)  07/26/2023   COVID-19 Vaccine (7 - 2023-24 season) 10/02/2023   MAMMOGRAM  09/12/2024   DTaP/Tdap/Td (2 - Td or Tdap) 03/18/2027   Colonoscopy  11/29/2028   Pneumonia Vaccine 13+ Years old  Completed   INFLUENZA VACCINE  Completed   DEXA SCAN  Completed   Hepatitis C Screening  Completed   Zoster Vaccines- Shingrix  Completed   HPV VACCINES  Aged Out    Health Maintenance  Health Maintenance Due  Topic Date Due   Medicare Annual Wellness (AWV)  07/26/2023    Colorectal cancer screening: Type of screening: Colonoscopy. Completed 11/2018. Repeat every 10 years  Mammogram status: Completed 09/2022. Repeat every year scheduled 09/15/2023  Bone Density status: Completed 11/2021. Results reflect: Bone density results: OSTEOPOROSIS. Repeat every 2 years.  Lung Cancer Screening: (Low Dose CT Chest recommended if Age 66-80 years, 20 pack-year currently smoking OR have quit w/in 15years.) does not  qualify.     Additional Screening:  Hepatitis C Screening: does qualify; Completed 10/2018  Vision Screening: Recommended annual ophthalmology exams for early detection of glaucoma and other disorders of the eye. Is the patient up to date with their annual eye exam?  No  Who is the provider or what is the name of the office in which the patient attends annual eye exams? [facility].Patient will schedule appointment If pt is not established with a provider, would they like to be referred to a provider to establish care? No .   Dental Screening: Recommended annual dental exams for proper oral hygiene    Community Resource Referral / Chronic Care Management: CRR required this visit?  No   CCM required this visit?  No     Plan:     I have personally reviewed and noted the following in the patient's chart:   Medical and social history Use of alcohol, tobacco or illicit drugs  Current medications and supplements including opioid prescriptions. Patient is not currently taking opioid prescriptions. Functional ability and status Nutritional status Physical activity Advanced directives List of other physicians Hospitalizations, surgeries, and ER visits in previous 12 months Vitals Screenings to include cognitive, depression, and falls Referrals and appointments  In addition, I have reviewed and discussed with patient certain preventive protocols,  quality metrics, and best practice recommendations. A written personalized care plan for preventive services as well as general preventive health recommendations were provided to patient.     Sydell Axon, LPN   [id number]/08/[id number]   After Visit Summary: (MyChart) Due to this being a telephonic visit, the after visit summary with patients personalized plan was offered to patient via MyChart   Nurse Notes: None

## 2023-09-07 NOTE — Patient Instructions (Signed)
Ms. Farrington , Thank you for taking time to come for your Medicare Wellness Visit. I appreciate your ongoing commitment to your health goals. Please review the following plan we discussed and let me know if I can assist you in the future.   Referrals/Orders/Follow-Ups/Clinician Recommendations: Remember to schedule your eye exam.   This is a list of the screening recommended for you and due dates:  Health Maintenance  Topic Date Due   COVID-19 Vaccine (7 - 2023-24 season) 10/02/2023   Medicare Annual Wellness Visit  09/06/2024   Mammogram  09/12/2024   DTaP/Tdap/Td vaccine (2 - Td or Tdap) 03/18/2027   Colon Cancer Screening  11/29/2028   Pneumonia Vaccine  Completed   Flu Shot  Completed   DEXA scan (bone density measurement)  Completed   Hepatitis C Screening  Completed   Zoster (Shingles) Vaccine  Completed   HPV Vaccine  Aged Out    Advanced directives: (Declined) Advance directive discussed with you today. Even though you declined this today, please call our office should you change your mind, and we can give you the proper paperwork for you to fill out. Will pick up paperwork at next visit.  Next Medicare Annual Wellness Visit scheduled for next year: Yes 09/12/24 @ 8:15

## 2023-09-11 ENCOUNTER — Telehealth: Payer: Self-pay

## 2023-09-11 NOTE — Telephone Encounter (Signed)
Patient called and left a message stating her PCP recommended that she follow up with Dr. Tobi Bastos since it has been a year since she has seen him. Return patient call and made appointment for 10/26/2023

## 2023-09-14 DIAGNOSIS — H9311 Tinnitus, right ear: Secondary | ICD-10-CM | POA: Diagnosis not present

## 2023-09-14 DIAGNOSIS — H903 Sensorineural hearing loss, bilateral: Secondary | ICD-10-CM | POA: Diagnosis not present

## 2023-09-15 ENCOUNTER — Ambulatory Visit
Admission: RE | Admit: 2023-09-15 | Discharge: 2023-09-15 | Disposition: A | Discharge status: Home / Self Care | Payer: Medicare Other | Source: Ambulatory Visit | Attending: Family | Admitting: Family

## 2023-09-15 DIAGNOSIS — Z1231 Encounter for screening mammogram for malignant neoplasm of breast: Secondary | ICD-10-CM | POA: Insufficient documentation

## 2023-09-18 ENCOUNTER — Encounter: Payer: Self-pay | Admitting: Family

## 2023-09-18 ENCOUNTER — Ambulatory Visit (INDEPENDENT_AMBULATORY_CARE_PROVIDER_SITE_OTHER): Payer: Medicare Other | Admitting: Family

## 2023-09-18 ENCOUNTER — Other Ambulatory Visit: Payer: Self-pay | Admitting: Family

## 2023-09-18 VITALS — BP 130/80 | HR 71 | Temp 97.9°F | Ht 64.0 in | Wt 159.2 lb

## 2023-09-18 DIAGNOSIS — K760 Fatty (change of) liver, not elsewhere classified: Secondary | ICD-10-CM

## 2023-09-18 DIAGNOSIS — Z8249 Family history of ischemic heart disease and other diseases of the circulatory system: Secondary | ICD-10-CM | POA: Insufficient documentation

## 2023-09-18 DIAGNOSIS — Z87898 Personal history of other specified conditions: Secondary | ICD-10-CM | POA: Insufficient documentation

## 2023-09-18 DIAGNOSIS — E119 Type 2 diabetes mellitus without complications: Secondary | ICD-10-CM | POA: Insufficient documentation

## 2023-09-18 MED ORDER — WEGOVY 0.25 MG/0.5ML ~~LOC~~ SOAJ: 0.2500 mg | SUBCUTANEOUS | 2 refills | Status: DC

## 2023-09-18 NOTE — Assessment & Plan Note (Signed)
Slight elevation of hepatic enzymes. Follow up with Dr Tobi Bastos is scheduled.   Discussed Wegovy versus metformin to aid in weight loss.  Of note, we also discussed advantage of GLP-1 in setting of  ASCVD risk due to her family history.  Trial of Wegovy.  Counseled on side effects, blackbox warning as a relates to medullary thyroid cancer, multiple endocrine neoplasia and titration.  We also discussed metformin side effects , titration.

## 2023-09-18 NOTE — Patient Instructions (Addendum)
If Reginal Lutes is not approved, we discussed starting metformin.    We have discussed starting non insulin daily injectable medication called Wegovy  which is a glucagon like peptide (GLP 1) agonist and works by delaying gastric emptying and increasing insulin secretion.It is given once per week. Most patients see significant weight loss with this drug class.   You may NOT take either medication if you or your family has history of thyroid, parathyroid, OR adrenal cancer. Please confirm you and your family does NOT have this history as this drug class has black box warning on this medication for that reason.   Please follow  directions on prescription and slowly increase from 0.25mg  [place] once per week ;stay here for 4 weeks. You may then increase to 0.5mg  [place] once per week and stay there for 4 weeks.  We can slowly titrate further at follow up with goal of no more than 1-2 lbs weight loss per week.  Semaglutide [facility])  Dose (mg) Once Weekly Titration:    If a dose is not tolerated, consider delaying further dose increases for  another 4 weeks.  If you are actively losing weight on a dose, do not increase medication.   Weeks 1 through 4  0.25 mg once weekly  Weeks 5 through 8  0.5 mg once weekly  Weeks 9 though 12  1 mg once weekly  Weeks 13 through 16  1.7 mg once weekly   Semaglutide Injection (Weight Management) What is this medication? SEMAGLUTIDE (SEM a GLOO tide) promotes weight loss. It may also be used to maintain weight loss. It works by decreasing appetite. Changes to diet and exercise are often combined with this medication. This medicine may be used for other purposes; ask your health care provider or pharmacist if you have questions. COMMON BRAND NAME(S): [id number] What should I tell my care team before I take this medication? They need to know if you have any of these conditions: Endocrine tumors (MEN 2) or if someone in your family had these tumors Eye disease, vision  problems Gallbladder disease History of depression or mental health disease History of pancreatitis Kidney disease Stomach or intestine problems Suicidal thoughts, plans, or attempt; a previous suicide attempt by you or a family member Thyroid cancer or if someone in your family had thyroid cancer An unusual or allergic reaction to semaglutide, other medications, foods, dyes, or preservatives Pregnant or trying to get pregnant Breast-feeding How should I use this medication? This medication is injected under the skin. You will be taught how to prepare and give it. Take it as directed on the prescription label. It is given once every week (every 7 days). Keep taking it unless your care team tells you to stop. It is important that you put your used needles and pens in a special sharps container. Do not put them in a trash can. If you do not have a sharps container, call your pharmacist or care team to get one. A special MedGuide will be given to you by the pharmacist with each prescription and refill. Be sure to read this information carefully each time. This medication comes with INSTRUCTIONS FOR USE. Ask your pharmacist for directions on how to use this medication. Read the information carefully. Talk to your pharmacist or care team if you have questions. Talk to your care team about the use of this medication in children. While it may be prescribed for children as young as 12 years for selected conditions, precautions do apply. Overdosage: If  you think you have taken too much of this medicine contact a poison control center or emergency room at once. NOTE: This medicine is only for you. Do not share this medicine with others. What if I miss a dose? If you miss a dose and the next scheduled dose is more than 2 days away, take the missed dose as soon as possible. If you miss a dose and the next scheduled dose is less than 2 days away, do not take the missed dose. Take the next dose at your regular  time. Do not take double or extra doses. If you miss your dose for 2 weeks or more, take the next dose at your regular time or call your care team to talk about how to restart this medication. What may interact with this medication? Insulin and other medications for diabetes This list may not describe all possible interactions. Give your health care provider a list of all the medicines, herbs, non-prescription drugs, or dietary supplements you use. Also tell them if you smoke, drink alcohol, or use illegal drugs. Some items may interact with your medicine. What should I watch for while using this medication? Visit your care team for regular checks on your progress. It may be some time before you see the benefit from this medication. Drink plenty of fluids while taking this medication. Check with your care team if you have severe diarrhea, nausea, and vomiting, or if you sweat a lot. The loss of too much body fluid may make it dangerous for you to take this medication. This medication may affect blood sugar levels. Ask your care team if changes in diet or medications are needed if you have diabetes. If you or your family notice any changes in your behavior, such as new or worsening depression, thoughts of harming yourself, anxiety, other unusual or disturbing thoughts, or memory loss, call your care team right away. Women should inform their care team if they wish to become pregnant or think they might be pregnant. Losing weight while pregnant is not advised and may cause harm to the unborn child. Talk to your care team for more information. What side effects may I notice from receiving this medication? Side effects that you should report to your care team as soon as possible: Allergic reactions--skin rash, itching, hives, swelling of the face, lips, tongue, or throat Change in vision Dehydration--increased thirst, dry mouth, feeling faint or lightheaded, headache, dark yellow or brown  urine Gallbladder problems--severe stomach pain, nausea, vomiting, fever Heart palpitations--rapid, pounding, or irregular heartbeat Kidney injury--decrease in the amount of urine, swelling of the ankles, hands, or feet Pancreatitis--severe stomach pain that spreads to your back or gets worse after eating or when touched, fever, nausea, vomiting Thoughts of suicide or self-harm, worsening mood, feelings of depression Thyroid cancer--new mass or lump in the neck, pain or trouble swallowing, trouble breathing, hoarseness Side effects that usually do not require medical attention (report to your care team if they continue or are bothersome): Diarrhea Loss of appetite Nausea Stomach pain Vomiting This list may not describe all possible side effects. Call your doctor for medical advice about side effects. You may report side effects to FDA at 1-800-FDA-1088. Where should I keep my medication? Keep out of the reach of children and pets. Refrigeration (preferred): Store in the refrigerator. Do not freeze. Keep this medication in the original container until you are ready to take it. Get rid of any unused medication after the expiration date. Room temperature: If needed,  prior to cap removal, the pen can be stored at room temperature for up to 28 days. Protect from light. If it is stored at room temperature, get rid of any unused medication after 28 days or after it expires, whichever is first. It is important to get rid of the medication as soon as you no longer need it or it is expired. You can do this in two ways: Take the medication to a medication take-back program. Check with your pharmacy or law enforcement to find a location. If you cannot return the medication, follow the directions in the MedGuide. NOTE: This sheet is a summary. It may not cover all possible information. If you have questions about this medicine, talk to your doctor, pharmacist, or health care provider.  2023 Elsevier/Gold  Standard (2021-01-10 00:00:00)

## 2023-09-18 NOTE — Progress Notes (Signed)
Assessment & Plan:  Hepatic steatosis Assessment & Plan: Slight elevation of hepatic enzymes. Follow up with Dr Tobi Bastos is scheduled.   Discussed Wegovy versus metformin to aid in weight loss.  Of note, we also discussed advantage of GLP-1 in setting of  ASCVD risk due to her family history.  Trial of Wegovy.  Counseled on side effects, blackbox warning as a relates to medullary thyroid cancer, multiple endocrine neoplasia and titration.  We also discussed metformin side effects , titration.      Orders: -     Wegovy; Inject 0.25 mg into the skin once a week.  Dispense: 2 mL; Refill: 2  Family history of ASCVD -     Wegovy; Inject 0.25 mg into the skin once a week.  Dispense: 2 mL; Refill: 2  History of prediabetes -     FAOZHY; Inject 0.25 mg into the skin once a week.  Dispense: 2 mL; Refill: 2     Return precautions given.   Risks, benefits, and alternatives of the medications and treatment plan prescribed today were discussed, and patient expressed understanding.   Education regarding symptom management and diagnosis given to patient on AVS either electronically or printed.  No follow-ups on file.  [name], FNP  Subjective:    Patient ID: Brittany Farley, female    DOB: May 24, 1951, 72 y.o.   MRN: [id number]  CC: Brittany Farley is a 72 y.o. female who presents today for follow up.   HPI: Overall feels well today  Frustrated by weight. She works out at [employer] regularly.   No personal or family h/o thyroid cancer     Scheduled to see Dr. Tobi Bastos in December History of prediabetes.  History of heart disease in her parents and 2 brothers  H/o fluctuating elevations in alkaline phosphatase, recently 92  seen in Dr Racheal Patches 09/17/2018 due to elevation alkaline phosphatase  Eosinophils 09/06/2018 7.4 ( previous 10.2).  CT abdomen pelvis 02/20/2022 mild intrahepatic biliary dilatation.  Initial consult Dr. Tobi Bastos 10/28/2022 regards to attic stenosis, review of CT  abdomen pelvis 02/2022 Encouraged weight loss, exercise, Mediterranean diet. Allergies: Patient has no known allergies. Current Outpatient Medications on File Prior to Visit  Medication Sig Dispense Refill   alendronate (FOSAMAX) 70 MG tablet Take 1 tablet (70 mg total) by mouth once a week. Take with a full glass of water on an empty stomach. 12 tablet 3   aspirin EC 81 MG tablet Take 81 mg by mouth daily.     atorvastatin (LIPITOR) 10 MG tablet TAKE 1 TABLET BY MOUTH DAILY 100 tablet 2   Calcium Carbonate-Vit D-Min (CALCIUM 1200 PO) Take by mouth in the morning and at bedtime.     Multiple Vitamin (MULTIVITAMIN ADULT PO) Take by mouth.     Cholecalciferol (VITAMIN D3) 2000 units TABS Take by mouth.     No current facility-administered medications on file prior to visit.    Review of Systems  Constitutional:  Negative for chills and fever.  Respiratory:  Negative for cough.   Cardiovascular:  Negative for chest pain and palpitations.  Gastrointestinal:  Negative for nausea and vomiting.      Objective:    BP 130/80   Pulse 71   Temp 97.9 F (36.6 C) (Oral)   Ht 5\' 4"  (1.626 m)   Wt 159 lb 3.2 oz (72.2 kg)   SpO2 97%   BMI 27.33 kg/m  BP Readings from Last 3 Encounters:  09/18/23 130/80  08/31/23 130/80  08/26/23 120/78   Wt Readings from Last 3 Encounters:  09/18/23 159 lb 3.2 oz (72.2 kg)  09/07/23 160 lb (72.6 kg)  08/31/23 160 lb 8 oz (72.8 kg)    Physical Exam Vitals reviewed.  Constitutional:      Appearance: She is well-developed.  Eyes:     Conjunctiva/sclera: Conjunctivae normal.  Neck:     Thyroid: No thyroid mass, thyromegaly or thyroid tenderness.  Cardiovascular:     Rate and Rhythm: Normal rate and regular rhythm.     Pulses: Normal pulses.     Heart sounds: Normal heart sounds.  Pulmonary:     Effort: Pulmonary effort is normal.     Breath sounds: Normal breath sounds. No wheezing, rhonchi or rales.  Skin:    General: Skin is warm and dry.   Neurological:     Mental Status: She is alert.  Psychiatric:        Speech: Speech normal.        Behavior: Behavior normal.        Thought Content: Thought content normal.

## 2023-09-21 ENCOUNTER — Other Ambulatory Visit: Payer: Self-pay | Admitting: Family

## 2023-09-21 ENCOUNTER — Other Ambulatory Visit (HOSPITAL_COMMUNITY): Payer: Self-pay

## 2023-09-21 ENCOUNTER — Encounter: Payer: Self-pay | Admitting: Family

## 2023-09-21 MED ORDER — METFORMIN HCL ER 500 MG PO TB24
500.0000 mg | ORAL_TABLET | Freq: Every evening | ORAL | 2 refills | Status: DC
Start: 2023-09-21 — End: 2023-12-17

## 2023-09-21 NOTE — Progress Notes (Signed)
close

## 2023-09-21 NOTE — Telephone Encounter (Signed)
Per test claim, medication is a product/benefit exclusion

## 2023-09-28 ENCOUNTER — Telehealth: Payer: Self-pay

## 2023-09-28 NOTE — Patient Outreach (Signed)
Attempted to contact patient regarding [facility]. Left voicemail for patient to return my call at [phone].  Nicholes Rough, CMA Care Guide [employer]

## 2023-10-26 ENCOUNTER — Ambulatory Visit: Payer: Medicare Other | Admitting: Gastroenterology

## 2023-10-26 VITALS — BP 144/79 | HR 65 | Temp 97.9°F | Wt 161.8 lb

## 2023-10-26 DIAGNOSIS — K760 Fatty (change of) liver, not elsewhere classified: Secondary | ICD-10-CM | POA: Diagnosis not present

## 2023-10-26 NOTE — Progress Notes (Signed)
Wyline Mood MD, MRCP(U.K) [address]  [address]  [place], [place] [address]  Main: [phone]  Fax: [phone]   Primary Care Physician: Allegra Grana, FNP  Primary Gastroenterologist:  Dr. Wyline Mood   Chief Complaint  Patient presents with   hepatic steatosis    HPI: Brittany Farley is a 72 y.o. female   Summary of history :  Initially referred to see me in December 2023 for fatty liver.She was previously seen by Dr. Maximino Greenland in December 2019 for elevated alkaline phosphatase at that point of time was having alcohol 1 or 2 times a month negative for hepatitis C although alkaline phosphatase elevated, GT was negative the fractionated alkaline phosphatase was at a higher proportion of bone and patient had osteoporosis.  Imaging showed fatty liver at that point of time.  She had a colonoscopy in 2020 January that showed 3 sessile polyps in the ascending colon and cecum and 1 sessile polyp in the ascending colon and a 5 mm polyp at the anus the polyps were hyperplastic.  Repeat colonoscopy in 10 years was suggested.      In April 2023 had a CT abdomen pelvis with contrast that showed enteric sutures inside the stomach GE junction showed a postcholecystectomy state borderline hepatic steatosis.   10/20/2022: CMP normal, INR 1.0 hemoglobin 14 g MCV 77 vitamin D 56   Hepatitis A total antibody negative hepatitis B surface antibody negative   Interval history 10/28/2022-10/26/2023   She weighs 161 pounds today 3 pounds lighter than what she did a year back.  08/31/2023 AST of 39 ALT of 27 alkaline phosphatase of 92 hemoglobin of 13.8.Platelet count 365.  No new complaints doing well.  Current Outpatient Medications  Medication Sig Dispense Refill   alendronate (FOSAMAX) 70 MG tablet Take 1 tablet (70 mg total) by mouth once a week. Take with a full glass of water on an empty stomach. 12 tablet 3   aspirin EC 81 MG tablet Take 81 mg by mouth daily.      atorvastatin (LIPITOR) 10 MG tablet TAKE 1 TABLET BY MOUTH DAILY 100 tablet 2   Calcium Carbonate-Vit D-Min (CALCIUM 1200 PO) Take by mouth in the morning and at bedtime.     metFORMIN (GLUCOPHAGE-XR) 500 MG 24 hr tablet Take 1 tablet (500 mg total) by mouth every evening. 30 tablet 2   Multiple Vitamin (MULTIVITAMIN ADULT PO) Take by mouth.     No current facility-administered medications for this visit.    Allergies as of 10/26/2023   (No Known Allergies)     ROS:  General: Negative for anorexia, weight loss, fever, chills, fatigue, weakness. ENT: Negative for hoarseness, difficulty swallowing , nasal congestion. CV: Negative for chest pain, angina, palpitations, dyspnea on exertion, peripheral edema.  Respiratory: Negative for dyspnea at rest, dyspnea on exertion, cough, sputum, wheezing.  GI: See history of present illness. GU:  Negative for dysuria, hematuria, urinary incontinence, urinary frequency, nocturnal urination.  Endo: Negative for unusual weight change.    Physical Examination:   BP (!) 144/79   Pulse 65   Temp 97.9 F (36.6 C) (Oral)   Wt 161 lb 12.8 oz (73.4 kg)   BMI 27.77 kg/m   General: Well-nourished, well-developed in no acute distress.  Eyes: No icterus. Conjunctivae pink. Mouth: Oropharyngeal mucosa moist and pink , no lesions erythema or exudate. Neuro: Alert and oriented x 3.  Grossly intact. Skin: Warm and dry, no jaundice.   Psych: Alert and cooperative,  normal mood and affect.   Imaging Studies: No results found.  Assessment and Plan:   Brittany Farley is a 72 y.o. y/o female here to follow-up for nonalcoholic fatty liver disease  Plan 1.  Continue lifestyle changes with weight loss, exercise, screening for cardiovascular risk factors and I will obtain a ultrasound elastography if it shows elevated liver stiffness then we can discuss about treatment for fatty liver  Dr Wyline Mood  MD,MRCP [facility] Follow up in 6 months

## 2023-10-26 NOTE — Patient Instructions (Signed)
Please arrive at the medical Mall at 10:15 AM for a 10:30 AM appointment. Please do not eat or drink anything after midnight the night before. If you need to change the date and time, please call [phone].   Happy Holidays!

## 2023-11-05 ENCOUNTER — Ambulatory Visit
Admission: RE | Admit: 2023-11-05 | Discharge: 2023-11-05 | Disposition: A | Discharge status: Home / Self Care | Payer: Medicare Other | Source: Ambulatory Visit | Attending: Gastroenterology | Admitting: Gastroenterology

## 2023-11-05 DIAGNOSIS — K760 Fatty (change of) liver, not elsewhere classified: Secondary | ICD-10-CM

## 2023-11-09 ENCOUNTER — Other Ambulatory Visit: Payer: Medicare Other

## 2023-11-10 ENCOUNTER — Ambulatory Visit
Admission: RE | Admit: 2023-11-10 | Discharge: 2023-11-10 | Disposition: A | Discharge status: Home / Self Care | Payer: Medicare Other | Source: Ambulatory Visit | Attending: Gastroenterology | Admitting: Gastroenterology

## 2023-11-10 DIAGNOSIS — K760 Fatty (change of) liver, not elsewhere classified: Secondary | ICD-10-CM | POA: Diagnosis not present

## 2023-11-23 ENCOUNTER — Telehealth: Payer: Self-pay

## 2023-11-23 NOTE — Telephone Encounter (Signed)
 Called patient to give her results and had to leave her a detailed message and if she had any further questions, to please call me back.

## 2023-11-23 NOTE — Telephone Encounter (Signed)
-----   Message from Wyline Mood sent at 11/19/2023  2:24 PM EST ----- Normal liver elastography

## 2023-12-01 ENCOUNTER — Other Ambulatory Visit: Payer: Self-pay | Admitting: Internal Medicine

## 2023-12-07 ENCOUNTER — Other Ambulatory Visit: Payer: Medicare Other

## 2023-12-14 ENCOUNTER — Encounter: Payer: Self-pay | Admitting: Internal Medicine

## 2023-12-14 ENCOUNTER — Ambulatory Visit (INDEPENDENT_AMBULATORY_CARE_PROVIDER_SITE_OTHER): Payer: Medicare Other | Admitting: Internal Medicine

## 2023-12-14 VITALS — BP 118/74 | HR 62 | Temp 97.5°F | Ht 64.0 in | Wt 160.2 lb

## 2023-12-14 DIAGNOSIS — R21 Rash and other nonspecific skin eruption: Secondary | ICD-10-CM | POA: Insufficient documentation

## 2023-12-14 MED ORDER — HYDROCORTISONE 1 % EX OINT: 1.0000 | TOPICAL_OINTMENT | Freq: Two times a day (BID) | CUTANEOUS | 0 refills | Status: AC

## 2023-12-14 NOTE — Patient Instructions (Addendum)
-  It was a pleasure seeing you today -I suspect that the rash you have is likely an allergic reaction.  I am uncertain as to what is causing this currently.  I would make sure that you have not changed any detergents/lotions/medications including over-the-counter medications recently. -I do not believe that this reaction is secondary to metformin but given that she started this recently would hold this for now and we can consider restarting this once your rash improves. -I will prescribe a steroid ointment for you to see if this will help with the rash.  Please take over-the-counter Zyrtec or Allegra to help with the itchiness -Please call us if you have any questions or concerns or if you notice your rash is worsening.  Please follow-up with [place] if your rash does not improve over the next 3 to 4 days.

## 2023-12-14 NOTE — Progress Notes (Signed)
Acute Office Visit  Subjective:     Patient ID: Brittany Farley, female    DOB: 10-25-1951, 73 y.o.   MRN: [id number]  Chief Complaint  Patient presents with   Acute Visit    Rash on neck and down the sides of arms x 2 weeks Itchy     HPI Patient is in today for rash over her neck, arms and upper chest.  Patient states that approximately 2 weeks ago she noticed a rash developing over her neck and then her upper arms and now the rash currently is also present over her upper chest as well as part of her forearms.  She states that the rash is itchy but not painful.  She has been using Benadryl ointment on it.  She denies any new lotions or detergents over-the-counter medications.  She does states that the only new medication she started was metformin approximately 1 month ago.  She denies any fevers or chills.  No oral lesions or other mucosal lesions.  Review of Systems  Constitutional: Negative.   Respiratory: Negative.    Cardiovascular: Negative.   Musculoskeletal: Negative.  Negative for myalgias.  Skin:  Positive for itching and rash.       Rash began over the back of her neck and spread to her arms and forearms bilaterally as well as her upper chest.  Psychiatric/Behavioral: Negative.          Objective:    BP 118/74   Pulse 62   Temp (!) 97.5 F (36.4 C)   Ht 5\' 4"  (1.626 m)   Wt 160 lb 3.2 oz (72.7 kg)   SpO2 98%   BMI 27.50 kg/m    Physical Exam Constitutional:      Appearance: Normal appearance.  HENT:     Head: Normocephalic and atraumatic.     Mouth/Throat:     Pharynx: Oropharynx is clear. No oropharyngeal exudate or posterior oropharyngeal erythema.  Cardiovascular:     Rate and Rhythm: Normal rate and regular rhythm.     Heart sounds: Normal heart sounds.  Pulmonary:     Effort: Pulmonary effort is normal. No respiratory distress.     Breath sounds: Normal breath sounds. No wheezing or rales.  Skin:    Findings: Rash present. Rash is not crusting,  macular, papular, purpuric or vesicular.       Neurological:     General: No focal deficit present.     Mental Status: She is alert.  Psychiatric:        Mood and Affect: Mood normal.        Behavior: Behavior normal.     No results found for any visits on 12/14/23.      Assessment & Plan:   Problem List Items Addressed This Visit       Musculoskeletal and Integument   Rash and other nonspecific skin eruption - Primary   -Patient presented to [place] with a 2-week history of rash which is pruritic but not painful. -On exam, patient was noted to have an erythematous maculopapular rash with excoriation marks noted over the back of her neck, arms and upper forearms as well as upper chest around the sternum -The only new medication she has been taking is metformin for the last month. -She denies any new lotions or detergents -I suspect the rash is allergic in nature.  However, etiology remains uncertain at this time -Will prescribe hydrocortisone cream twice daily and over-the-counter Allegra or Zyrtec as needed for itching -I  have also asked the patient to hold her metformin till the rash resolves.  I am uncertain if metformin is etiology behind this rash but given that this is her only new medication I believe it is prudent to hold this and consider possibly restarting or changing her medication once the rash resolves      Relevant Medications   hydrocortisone 1 % ointment    Meds ordered this encounter  Medications   hydrocortisone 1 % ointment    Sig: Apply 1 Application topically 2 (two) times daily.    Dispense:  30 g    Refill:  0    No follow-ups on file.  Earl Lagos, MD

## 2023-12-14 NOTE — Assessment & Plan Note (Signed)
-  Patient presented to [place] with a 2-week history of rash which is pruritic but not painful. -On exam, patient was noted to have an erythematous maculopapular rash with excoriation marks noted over the back of her neck, arms and upper forearms as well as upper chest around the sternum -The only new medication she has been taking is metformin for the last month. -She denies any new lotions or detergents -I suspect the rash is allergic in nature.  However, etiology remains uncertain at this time -Will prescribe hydrocortisone cream twice daily and over-the-counter Allegra or Zyrtec as needed for itching -I have also asked the patient to hold her metformin till the rash resolves.  I am uncertain if metformin is etiology behind this rash but given that this is her only new medication I believe it is prudent to hold this and consider possibly restarting or changing her medication once the rash resolves

## 2023-12-17 ENCOUNTER — Other Ambulatory Visit: Payer: Self-pay | Admitting: Family

## 2023-12-17 ENCOUNTER — Encounter: Payer: Self-pay | Admitting: Family

## 2023-12-17 ENCOUNTER — Other Ambulatory Visit: Payer: Medicare Other

## 2023-12-17 DIAGNOSIS — R899 Unspecified abnormal finding in specimens from other organs, systems and tissues: Secondary | ICD-10-CM | POA: Diagnosis not present

## 2023-12-17 LAB — CBC WITH DIFFERENTIAL/PLATELET
Basophils Absolute: 0.1 10*3/uL (ref 0.0–0.1)
Basophils Relative: 1.3 % (ref 0.0–3.0)
Eosinophils Absolute: 0.4 10*3/uL (ref 0.0–0.7)
Eosinophils Relative: 9 % — ABNORMAL HIGH (ref 0.0–5.0)
HCT: 41 % (ref 36.0–46.0)
Hemoglobin: 13.4 g/dL (ref 12.0–15.0)
Lymphocytes Relative: 35.5 % (ref 12.0–46.0)
Lymphs Abs: 1.5 10*3/uL (ref 0.7–4.0)
MCHC: 32.6 g/dL (ref 30.0–36.0)
MCV: 79 fL (ref 78.0–100.0)
Monocytes Absolute: 0.4 10*3/uL (ref 0.1–1.0)
Monocytes Relative: 9.2 % (ref 3.0–12.0)
Neutro Abs: 2 10*3/uL (ref 1.4–7.7)
Neutrophils Relative %: 45 % (ref 43.0–77.0)
Platelets: 334 10*3/uL (ref 150.0–400.0)
RBC: 5.19 Mil/uL — ABNORMAL HIGH (ref 3.87–5.11)
RDW: 13.7 % (ref 11.5–15.5)
WBC: 4.4 10*3/uL (ref 4.0–10.5)

## 2023-12-18 ENCOUNTER — Other Ambulatory Visit: Payer: Self-pay

## 2023-12-18 DIAGNOSIS — R899 Unspecified abnormal finding in specimens from other organs, systems and tissues: Secondary | ICD-10-CM

## 2023-12-18 NOTE — Telephone Encounter (Signed)
 Spoke to pt and scheduled f/up and ordered labs

## 2024-01-21 ENCOUNTER — Ambulatory Visit: Payer: Medicare Other | Admitting: Family

## 2024-01-21 ENCOUNTER — Encounter: Payer: Self-pay | Admitting: Family

## 2024-01-21 VITALS — BP 118/72 | HR 60 | Temp 97.8°F | Ht 64.0 in | Wt 158.4 lb

## 2024-01-21 DIAGNOSIS — Z87898 Personal history of other specified conditions: Secondary | ICD-10-CM

## 2024-01-21 DIAGNOSIS — R7303 Prediabetes: Secondary | ICD-10-CM

## 2024-01-21 DIAGNOSIS — D721 Eosinophilia, unspecified: Secondary | ICD-10-CM

## 2024-01-21 LAB — MICROALBUMIN / CREATININE URINE RATIO
Creatinine,U: 68.2 mg/dL
Microalb Creat Ratio: 11.3 mg/g (ref 0.0–30.0)
Microalb, Ur: 0.8 mg/dL (ref 0.0–1.9)

## 2024-01-21 MED ORDER — METFORMIN HCL ER 500 MG PO TB24: 2000.0000 mg | ORAL_TABLET | Freq: Every evening | ORAL | Status: DC

## 2024-01-21 NOTE — Progress Notes (Signed)
 Assessment & Plan:  Prediabetes -     Microalbumin / creatinine urine ratio  Eosinophilia, unspecified type Assessment & Plan: Chronic, asymptomatic .pending repeat CBC with differential.    History of prediabetes Assessment & Plan: Congratulated patient on weight loss.  We jointly agreed to continue metformin 2000 mg daily.   Other orders -     metFORMIN HCl ER; Take 4 tablets (2,000 mg total) by mouth every evening.     Return precautions given.   Risks, benefits, and alternatives of the medications and treatment plan prescribed today were discussed, and patient expressed understanding.   Education regarding symptom management and diagnosis given to patient on AVS either electronically or printed.  Return in about 6 months (around 07/23/2024).  [name], FNP  Subjective:    Patient ID: Brittany Farley, female    DOB: 23-Jun-1951, 73 y.o.   MRN: [id number]  CC: Keisy Strickler is a 73 y.o. female who presents today for follow up.   HPI: Overall feels well today.  She continues to stay active and exercise regularly.  Compliant with metformin 2000 mg daily and tolerating without GI side effects.  She is also approximately 3 pounds    Allergies: Patient has no known allergies. Current Outpatient Medications on File Prior to Visit  Medication Sig Dispense Refill   alendronate (FOSAMAX) 70 MG tablet TAKE 1 TABLET BY MOUTH WEEKLY  TAKE WITH A FULL GLASS OF WATER  ON AN EMPTY STOMACH 12 tablet 0   aspirin EC 81 MG tablet Take 81 mg by mouth daily.     atorvastatin (LIPITOR) 10 MG tablet TAKE 1 TABLET BY MOUTH DAILY 100 tablet 2   Calcium Carbonate-Vit D-Min (CALCIUM 1200 PO) Take by mouth in the morning and at bedtime.     hydrocortisone 1 % ointment Apply 1 Application topically 2 (two) times daily. 30 g 0   Multiple Vitamin (MULTIVITAMIN ADULT PO) Take by mouth.     No current facility-administered medications on file prior to visit.    Review of Systems   Constitutional:  Negative for chills and fever.  Respiratory:  Negative for cough.   Cardiovascular:  Negative for chest pain and palpitations.  Gastrointestinal:  Negative for nausea and vomiting.      Objective:    BP 118/72   Pulse 60   Temp 97.8 F (36.6 C) (Oral)   Ht 5\' 4"  (1.626 m)   Wt 158 lb 6.4 oz (71.8 kg)   SpO2 98%   BMI 27.19 kg/m  BP Readings from Last 3 Encounters:  01/21/24 118/72  12/14/23 118/74  10/26/23 (!) 144/79   Wt Readings from Last 3 Encounters:  01/21/24 158 lb 6.4 oz (71.8 kg)  12/14/23 160 lb 3.2 oz (72.7 kg)  10/26/23 161 lb 12.8 oz (73.4 kg)    Physical Exam Vitals reviewed.  Constitutional:      Appearance: She is well-developed.  Eyes:     Conjunctiva/sclera: Conjunctivae normal.  Cardiovascular:     Rate and Rhythm: Normal rate and regular rhythm.     Pulses: Normal pulses.     Heart sounds: Normal heart sounds.  Pulmonary:     Effort: Pulmonary effort is normal.     Breath sounds: Normal breath sounds. No wheezing, rhonchi or rales.  Skin:    General: Skin is warm and dry.  Neurological:     Mental Status: She is alert.  Psychiatric:        Speech: Speech normal.  Behavior: Behavior normal.        Thought Content: Thought content normal.

## 2024-01-21 NOTE — Assessment & Plan Note (Signed)
 Chronic, asymptomatic .pending repeat CBC with differential.

## 2024-01-21 NOTE — Assessment & Plan Note (Signed)
 Congratulated patient on weight loss.  We jointly agreed to continue metformin 2000 mg daily.

## 2024-01-26 ENCOUNTER — Encounter: Payer: Self-pay | Admitting: Internal Medicine

## 2024-01-26 ENCOUNTER — Ambulatory Visit: Payer: Medicare Other | Admitting: Internal Medicine

## 2024-01-26 ENCOUNTER — Telehealth: Payer: Self-pay | Admitting: Internal Medicine

## 2024-01-26 ENCOUNTER — Encounter: Payer: Self-pay | Admitting: Family

## 2024-01-26 VITALS — BP 132/68 | HR 58 | Ht 64.0 in | Wt 158.0 lb

## 2024-01-26 DIAGNOSIS — M810 Age-related osteoporosis without current pathological fracture: Secondary | ICD-10-CM | POA: Diagnosis not present

## 2024-01-26 MED ORDER — ALENDRONATE SODIUM 70 MG PO TABS: 70.0000 mg | ORAL_TABLET | ORAL | 3 refills | Status: DC

## 2024-01-26 NOTE — Patient Instructions (Signed)
-   Alendronate ( Fosamax) 70 mg , 1 tablet weekly - Continue Calcium and Vitamin D daily

## 2024-01-26 NOTE — Progress Notes (Signed)
 Name: Brittany Farley  MRN/ DOB: [id number], 04/02/1951    Age/ Sex: 73 y.o., female     PCP: Allegra Grana, FNP   Reason for Endocrinology Evaluation: Osteoporosis/elevated Alk. Phos      Initial Endocrinology Clinic Visit: 06/11/2020    PATIENT IDENTIFIER: Brittany Farley is a 73 y.o., female with a past medical history of low bone density and dyslipidemia. She has followed with [facility] since 06/11/2020 for consultative assistance with management of her Elevated Alk. Phos and osteoporosis  HISTORICAL SUMMARY:  Pt has been noted to have elevated Alkaline phosphatase since 2018 with fluctuating readings.  Alk. Phos isozymes showed 58% of bone origin and 42 % liver origin.    Pt was diagnosed with osteoporosis:2019   Menarche at age : 63 Menopausal at age : Hysterectomy less than 30 yrs Fracture Hx: no Hx of HRT: no FH of osteoporosis or hip fracture: Mother hip fracture   Started Alendronate 06/2020  Repeat DXA scan in 2023 showed improvement of BMD at hips but unfortunately her T-score at the distal radius showed worsening from a -3.0 to a -3.4  SUBJECTIVE:    Today (01/26/2024):  Brittany Farley is here for a follow up on osteoporosis .  She follows with GI for hepatic steatosis Continues with weight bearing exercise  Denies heartburn or nausea  Denies constipation or diarrhea  No recent falls or bone fracture      HOME ENDOCRINE MEDICATIONS Alendronate 70 mg weekly  Calcium 1200 mg daily  Vitamin D 2000 iu daily      HISTORY:  Past Medical History:  Past Medical History:  Diagnosis Date   Arthritis    right hand   Osteoporosis    Past Surgical History:  Past Surgical History:  Procedure Laterality Date   ABDOMINAL HYSTERECTOMY  1980   had hysterectomy for fibroids. No cancer. Has both ovaries and cervix.   BARIATRIC SURGERY     late 60's   CATARACT EXTRACTION W/PHACO Right 02/20/2020   Procedure: CATARACT EXTRACTION PHACO AND  INTRAOCULAR LENS PLACEMENT (IOC) RIGHT 2.33  00:24.3;  Surgeon: Nevada Crane, MD;  Location: [facility];  Service: Ophthalmology;  Laterality: Right;   CATARACT EXTRACTION W/PHACO Left 04/02/2020   Procedure: CATARACT EXTRACTION PHACO AND INTRAOCULAR LENS PLACEMENT (IOC) LEFT;  Surgeon: Nevada Crane, MD;  Location: [facility];  Service: Ophthalmology;  Laterality: Left;  1.68 0:28.8   CHOLECYSTECTOMY  2011   COLONOSCOPY WITH PROPOFOL N/A 11/29/2018   Procedure: COLONOSCOPY WITH PROPOFOL;  Surgeon: Pasty Spillers, MD;  Location: [facility];  Service: Endoscopy;  Laterality: N/A;   left total hip arthroplasty Left    10/29/2022, Dr Odis Luster   OOPHORECTOMY     REPLACEMENT TOTAL HIP W/  RESURFACING IMPLANTS Right 05/2022   right total hip arthroplasty Right    Dr Odis Luster 05/2022   TUMOR EXCISION N/A 11/28/2021   Procedure: TRANSRECTAL TUMOR EXCISION, polyp;  Surgeon: Leafy Ro, MD;  Location: [facility];  Service: General;  Laterality: N/A;   Social History:  reports that she has never smoked. She has never used smokeless tobacco. She reports that she does not currently use alcohol. She reports that she does not use drugs. Family History:  Family History  Problem Relation Age of Onset   Breast cancer Mother 86   Alzheimer's disease Mother    Heart attack Mother 67   Heart attack Father 38   Lung cancer Sister    Diabetes  Sister    Prostate cancer Brother    Heart disease Brother 69   Heart disease Brother 19   Breast cancer Maternal Aunt 64   Stroke Cousin    Hypertension Cousin    Thyroid cancer Neg Hx      HOME MEDICATIONS: Allergies as of 01/26/2024   No Known Allergies      Medication List        Accurate as of January 26, 2024  9:48 AM. If you have any questions, ask your nurse or doctor.          alendronate 70 MG tablet Commonly known as: FOSAMAX TAKE 1 TABLET BY MOUTH WEEKLY  TAKE WITH A FULL GLASS OF WATER  ON AN EMPTY  STOMACH   aspirin EC 81 MG tablet Take 81 mg by mouth daily.   atorvastatin 10 MG tablet Commonly known as: LIPITOR TAKE 1 TABLET BY MOUTH DAILY   CALCIUM 1200 PO Take by mouth in the morning and at bedtime.   hydrocortisone 1 % ointment Apply 1 Application topically 2 (two) times daily.   metFORMIN 500 MG 24 hr tablet Commonly known as: GLUCOPHAGE-XR Take 4 tablets (2,000 mg total) by mouth every evening.   MULTIVITAMIN ADULT PO Take by mouth.          OBJECTIVE:   PHYSICAL EXAM: VS: BP 132/68   Pulse (!) 58   Ht 5\' 4"  (1.626 m)   Wt 158 lb (71.7 kg)   SpO2 98%   BMI 27.12 kg/m    EXAM: General: Pt appears well and is in NAD  Lungs: Clear with good BS bilat   Heart: Auscultation: RRR.  Abdomen: soft, nontender  Extremities:  BL LE: No pretibial edema normal  Mental Status: Judgment, insight: Intact Orientation: Oriented to time, place, and person Mood and affect: No depression, anxiety, or agitation     DATA REVIEWED:   Latest Reference Range & Units 08/31/23 08:43  Sodium 135 - 145 mEq/L 137  Potassium 3.5 - 5.1 mEq/L 3.9  Chloride 96 - 112 mEq/L 100  CO2 19 - 32 mEq/L 30  Glucose 70 - 99 mg/dL 91  BUN 6 - 23 mg/dL 27 (H)  Creatinine 8.29 - 1.20 mg/dL 5.62  Calcium 8.4 - 13.0 mg/dL 9.4    Latest Reference Range & Units 08/31/23 08:43  GFR >60.00 mL/min 83.94     Latest Reference Range & Units 06/17/23 08:59  VITD 30.00 - 100.00 ng/mL 67.16   Old records , labs and images have been reviewed.    DXA 12/11/2021  T-score BMD         %Change vs. Previous Significant Change (*) DualFemur Total Right 12/11/2021 72.4 Osteopenia -1.3 0.842 g/cm2 8.5% Yes DualFemur Total Right 03/15/2020 70.6 Osteopenia -1.8 0.776 g/cm2 -3.2% Yes    DualFemur Total Mean 12/11/2021 72.4 Osteopenia -1.3 0.847 g/cm2 5.5% Yes DualFemur Total Mean 03/15/2020 70.6 Osteopenia -1.6 0.803 g/cm2 -4.6% Yes  Left Forearm Radius 33% 12/11/2021 72.4 Osteoporosis -3.4  0.578 g/cm2 -6.3% Yes Left Forearm Radius 33% 03/15/2020 70.6 Osteoporosis -3.0 0.617 g/cm2 4.6% -    ASSESSMENT: The BMD measured at Forearm Radius 33% is 0.578 g/cm2 with a T-score of -3.4. This patient is considered osteoporotic according to [employer]    ASSESSMENT / PLAN / RECOMMENDATIONS:   1.Osteoporosis :    -Her ast DXA scan (2023) showed worsening of BMD at the left forearm from a T-score of -3.0 to -3.4, but there has been improvement at the hips -  She is compliant with alendronate , calcium and vitamin D intake as well as weight bearing exercise -She will be scheduled for repeat DXA scan at [facility] with [place] [facility]  Medications : Continue Alendronate 70 mg weekly  Continue Calcium 1200 mg daily  Continue Vitamin D 2000 iu daily       F/U in 1 yr     Signed electronically by: Lyndle Herrlich, MD  [facility]  [facility] [address] [place], [place] [address] Phone: [phone] FAX: [phone]      CC: Allegra Grana, FNP [address] [place] [address] Phone: [phone]  Fax: [phone]   Return to Endocrinology clinic as below: Future Appointments  Date Time Provider Department Center  01/26/2024  9:50 AM Erbie Arment, Konrad Dolores, MD [facility] None  03/14/2024 10:30 AM Allegra Grana, FNP [facility]  07/26/2024  8:00 AM Allegra Grana, FNP [facility]  09/02/2024  8:30 AM Allegra Grana, FNP [facility]  09/12/2024  8:10 AM [facility] ANNUAL WELLNESS VISIT [facility]

## 2024-01-26 NOTE — Telephone Encounter (Signed)
 Please schedule the patient for a bone density for a follow-up on osteoporosis at [facility] at [facility]   Thanks

## 2024-02-03 ENCOUNTER — Ambulatory Visit
Admission: RE | Admit: 2024-02-03 | Discharge: 2024-02-03 | Disposition: A | Discharge status: Home / Self Care | Source: Ambulatory Visit | Attending: Internal Medicine | Admitting: Internal Medicine

## 2024-02-03 ENCOUNTER — Other Ambulatory Visit: Payer: Self-pay | Admitting: Internal Medicine

## 2024-02-03 DIAGNOSIS — M810 Age-related osteoporosis without current pathological fracture: Secondary | ICD-10-CM | POA: Diagnosis not present

## 2024-02-04 ENCOUNTER — Encounter: Payer: Self-pay | Admitting: Internal Medicine

## 2024-02-25 ENCOUNTER — Other Ambulatory Visit: Payer: Self-pay | Admitting: Family

## 2024-02-25 DIAGNOSIS — E785 Hyperlipidemia, unspecified: Secondary | ICD-10-CM

## 2024-03-14 ENCOUNTER — Ambulatory Visit: Payer: Medicare Other | Admitting: Family

## 2024-03-14 ENCOUNTER — Other Ambulatory Visit: Payer: Self-pay | Admitting: Family

## 2024-06-07 ENCOUNTER — Other Ambulatory Visit: Payer: Self-pay

## 2024-06-07 ENCOUNTER — Emergency Department: Admission: EM | Admit: 2024-06-07 | Discharge: 2024-06-07 | Disposition: A | Discharge status: Home / Self Care

## 2024-06-07 DIAGNOSIS — M79605 Pain in left leg: Secondary | ICD-10-CM | POA: Insufficient documentation

## 2024-06-07 DIAGNOSIS — R252 Cramp and spasm: Secondary | ICD-10-CM

## 2024-06-07 DIAGNOSIS — M79604 Pain in right leg: Secondary | ICD-10-CM | POA: Diagnosis not present

## 2024-06-07 HISTORY — DX: Lymphedema, not elsewhere classified: I89.0

## 2024-06-07 LAB — CBC WITH DIFFERENTIAL/PLATELET
Abs Immature Granulocytes: 0 K/uL (ref 0.00–0.07)
Basophils Absolute: 0 K/uL (ref 0.0–0.1)
Basophils Relative: 1 %
Eosinophils Absolute: 0.4 K/uL (ref 0.0–0.5)
Eosinophils Relative: 8 %
HCT: 41.9 % (ref 36.0–46.0)
Hemoglobin: 13.5 g/dL (ref 12.0–15.0)
Immature Granulocytes: 0 %
Lymphocytes Relative: 33 %
Lymphs Abs: 1.5 K/uL (ref 0.7–4.0)
MCH: 25.2 pg — ABNORMAL LOW (ref 26.0–34.0)
MCHC: 32.2 g/dL (ref 30.0–36.0)
MCV: 78.2 fL — ABNORMAL LOW (ref 80.0–100.0)
Monocytes Absolute: 0.4 K/uL (ref 0.1–1.0)
Monocytes Relative: 8 %
Neutro Abs: 2.3 K/uL (ref 1.7–7.7)
Neutrophils Relative %: 50 %
Platelets: 297 K/uL (ref 150–400)
RBC: 5.36 MIL/uL — ABNORMAL HIGH (ref 3.87–5.11)
RDW: 13.9 % (ref 11.5–15.5)
WBC: 4.6 K/uL (ref 4.0–10.5)
nRBC: 0 % (ref 0.0–0.2)

## 2024-06-07 LAB — BASIC METABOLIC PANEL WITH GFR
Anion gap: 6 (ref 5–15)
BUN: 16 mg/dL (ref 8–23)
CO2: 25 mmol/L (ref 22–32)
Calcium: 8.7 mg/dL — ABNORMAL LOW (ref 8.9–10.3)
Chloride: 109 mmol/L (ref 98–111)
Creatinine, Ser: 0.53 mg/dL (ref 0.44–1.00)
GFR, Estimated: >60 mL/min (ref >60)
Glucose, Bld: 103 mg/dL — ABNORMAL HIGH (ref 70–99)
Potassium: 4.2 mmol/L (ref 3.5–5.1)
Sodium: 140 mmol/L (ref 135–145)

## 2024-06-07 LAB — CK: Total CK: 166 U/L (ref 38–234)

## 2024-06-07 LAB — MAGNESIUM: Magnesium: 2.2 mg/dL (ref 1.7–2.4)

## 2024-06-07 MED ORDER — SODIUM CHLORIDE 0.9 % IV BOLUS
1000.0000 mL | Freq: Once | INTRAVENOUS | Status: AC
  Administered 2024-06-07: 1000 mL via INTRAVENOUS

## 2024-06-07 NOTE — Discharge Instructions (Signed)
 You were seen in the emergency department for an episode of bilateral leg cramps.  Workup today was reassuring.  Please ensure adequate hydration (your urine should be clear).  For the next 48 hours rest and relax.  Elevate your legs is much as possible.  Do gentle stretching and consider a foam roller.  Return with any acutely worsening symptoms or any other emergency.  It was very nice meeting you and I wish you the best of luck. Rolland Moats, MD, PhD -- RETURN PRECAUTIONS & AFTERCARE: (ENGLISH) RETURN PRECAUTIONS: Return immediately to the emergency department or see/call your doctor if you feel worse, weak or have changes in speech or vision, are short of breath, have fever, vomiting, pain, bleeding or dark stool, trouble urinating or any new issues. Return here or see/call your doctor if not improving as expected for your suspected condition. FOLLOW-UP CARE: Call your doctor and/or any doctors we referred you to for more advice and to make an appointment. Do this today, tomorrow or after the weekend. Some doctors only take PPO insurance so if you have HMO insurance you may want to contact your HMO or your regular doctor for referral to a specialist within your plan. Either way tell the doctor's office that it was a referral from the emergency department so you get the soonest possible appointment.  YOUR TEST RESULTS: Take result reports of any blood or urine tests, imaging tests and EKG's to your doctor and any referral doctor. Have any abnormal tests repeated. Your doctor or a referral doctor can let you know when this should be done. Also make sure your doctor contacts this hospital to get any test results that are not currently available such as cultures or special tests for infection and final imaging reports, which are often not available at the time you leave the ER but which may list additional important findings that are not documented on the preliminary report. BLOOD PRESSURE: If your blood  pressure was greater than 120/80 have your blood pressure rechecked within 1 to 2 weeks. MEDICATION SIDE EFFECTS: Do not drive, walk, bike, take the bus, etc. if you have received or are being prescribed any sedating medications such as those for pain or anxiety or certain antihistamines like Benadryl. If you have been give one of these here get a taxi home or have a friend drive you home. Ask your pharmacist to counsel you on potential side effects of any new medication

## 2024-06-07 NOTE — ED Provider Notes (Signed)
 [facility] Provider Note    Event Date/Time   First MD Initiated Contact with Patient 06/07/24 [phone]     (approximate)   History   Extremity Pain   HPI  Brittany Farley is a 73 y.o. female  Eosinophilia, prediabetes who presents with an episode of bilateral leg pain yesterday evening that has now resolved.  Patient states that she did exert herself over the past 2 days in the heat.  Went to bed feeling bad but woke up in the middle night with crampy pain that radiated from her thighs down to her calves into her ankles.  Pain lasted approximately 15 minutes and then she fell back asleep.  She is currently asymptomatic but is afraid the pain may recur.  Denies any sensation changes skin changes or shortness of breath.  This has not occurred previously to help.  No history of VTE      Physical Exam   Triage Vital Signs: ED Triage Vitals  Encounter Vitals Group     BP 06/07/24 0813 (!) 167/75     Girls Systolic BP Percentile --      Girls Diastolic BP Percentile --      Boys Systolic BP Percentile --      Boys Diastolic BP Percentile --      Pulse Rate 06/07/24 0813 68     Resp 06/07/24 0813 16     Temp 06/07/24 0813 98.4 F (36.9 C)     Temp Source 06/07/24 0813 Oral     SpO2 06/07/24 0813 99 %     Weight 06/07/24 0814 160 lb (72.6 kg)     Height 06/07/24 0814 5' 4 (1.626 m)     Head Circumference --      Peak Flow --      Pain Score 06/07/24 0814 0     Pain Loc --      Pain Education --      Exclude from Growth Chart --     Most recent vital signs: Vitals:   06/07/24 0813 06/07/24 1054  BP: (!) 167/75 (!) 152/76  Pulse: 68 63  Resp: 16 16  Temp: 98.4 F (36.9 C)   SpO2: 99% 100%    Nursing Triage Note reviewed. Vital signs reviewed and patients oxygen saturation is normoxic  General: Patient is well nourished, well developed, awake and alert, resting comfortably in no acute distress Head: Normocephalic and atraumatic Eyes: Normal  inspection, extraocular muscles intact, no conjunctival pallor Ear, nose, throat: Normal external exam Neck: Normal range of motion Respiratory: Patient is in no respiratory distress, lungs CTAB Cardiovascular: Patient is not tachycardic, RRR without murmur appreciated GI: Abd SNT with no guarding or rebound  Back: Normal inspection of the back with good strength and range of motion throughout all ext Extremities: pulses intact with good cap refills, no LE pitting edema or calf tenderness Neuro: The patient is alert and oriented to person, place, and time, appropriately conversive, with 5/5 bilat UE/LE strength, no gross motor or sensory defects noted. Coordination appears to be adequate. Skin: Warm, dry, and intact Psych: normal mood and affect, no SI or HI  ED Results / Procedures / Treatments   Labs (all labs ordered are listed, but only abnormal results are displayed) Labs Reviewed  CBC WITH DIFFERENTIAL/PLATELET - Abnormal; Notable for the following components:      Result Value   RBC 5.36 (*)    MCV 78.2 (*)    MCH 25.2 (*)  All other components within normal limits  BASIC METABOLIC PANEL WITH GFR - Abnormal; Notable for the following components:   Glucose, Bld 103 (*)    Calcium  8.7 (*)    All other components within normal limits  MAGNESIUM  CK     EKG None  RADIOLOGY None    PROCEDURES:  Critical Care performed: No  Procedures   MEDICATIONS ORDERED IN ED: Medications  sodium chloride  0.9 % bolus 1,000 mL (0 mLs Intravenous Stopped 06/07/24 1051)     IMPRESSION / MDM / ASSESSMENT AND PLAN / ED COURSE                                Differential diagnosis includes, but is not limited to, muscle cramps, electrolyte derangement, anemia, rhabdo less likely DVT  ED course: Patient is well-appearing in her lower extremities have no focal neurological deficits, a well-perfused and without abnormality on exam.  CK was not elevated and she had no profound  electrolyte derangements or anemia.  I did consider the possibility of DVT however given the bilateral nature, the fact that her symptoms have completely resolved and there is no tenderness on exam I do not think this is likely.  Patient received 1 L of fluid.  She was advised to return if pain reoccurred.  She will do gentle stretching in the interim and ensure adequate hydration   Clinical Course as of 06/07/24 1107  Tue Jun 07, 2024  0927 CBC with Differential(!) No acute anemia [HD]  0940 Creatinine: 0.53 No profound electrolyte derangements [HD]  0941 Magnesium: 2.2 Not low [HD]  0946 Patient reassessed and denies any complaints.  Defers comfortable returning home.  She will do gentle stretching and possibly purchase a firmer wall and follow-up with primary care physician.  All questions answered patient voiced understanding [HD]    Clinical Course User Index [HD] Nicholaus Rolland BRAVO, MD   At time of discharge there is no evidence of acute life, limb, vision, or fertility threat. Patient has stable vital signs, pain is well controlled, patient is ambulatory and p.o. tolerant.  Discharge instructions were completed using the [employer] system. I would refer you to those at this time. All warnings prescriptions follow-up etc. were discussed in detail with the patient. Patient indicates understanding and is agreeable with this plan. All questions answered.  Patient is made aware that they may return to the emergency department for any worsening or new condition or for any other emergency.  Suggested E/M Coding Level: 4, 99284  This level has been selected based on the 06-19-22 CPT guidelines for E/M codes in the Emergency Department based on 2/3 of the CoPA, Data, and Risk.  COPA: The patient has an exacerbation, progression, or side effect of treatment of the following illness/illnesses: muscle cramps Data(1/3 categories were performed): I reviewed or ordered at least three unique tests,  external notes reviewed, and/or the history required an independent historian as one of the three     FINAL CLINICAL IMPRESSION(S) / ED DIAGNOSES   Final diagnoses:  Leg cramping     Rx / DC Orders   ED Discharge Orders     None        Note:  This document was prepared using Dragon voice recognition software and may include unintentional dictation errors.   Nicholaus Rolland BRAVO, MD 06/07/24 [phone]

## 2024-06-07 NOTE — ED Triage Notes (Signed)
 Pt c/o bilateral lower extremity pain x 3hrs.  Denies pain.  Pt reports she was laying in bed when the pain began and still in bed when it subsided.  Pt reports it felt like the pain came from around my waist area and down both legs into my feet.      Denies injury.

## 2024-06-17 ENCOUNTER — Encounter: Payer: Medicare Other | Admitting: Family

## 2024-07-06 ENCOUNTER — Ambulatory Visit: Admitting: Family

## 2024-07-06 ENCOUNTER — Encounter: Payer: Self-pay | Admitting: Family

## 2024-07-06 VITALS — BP 130/80 | HR 75 | Temp 97.5°F | Ht 64.0 in | Wt 158.4 lb

## 2024-07-06 DIAGNOSIS — R252 Cramp and spasm: Secondary | ICD-10-CM

## 2024-07-06 DIAGNOSIS — Z13220 Encounter for screening for lipoid disorders: Secondary | ICD-10-CM | POA: Diagnosis not present

## 2024-07-06 DIAGNOSIS — Z87898 Personal history of other specified conditions: Secondary | ICD-10-CM

## 2024-07-06 DIAGNOSIS — M810 Age-related osteoporosis without current pathological fracture: Secondary | ICD-10-CM

## 2024-07-06 DIAGNOSIS — Z136 Encounter for screening for cardiovascular disorders: Secondary | ICD-10-CM | POA: Diagnosis not present

## 2024-07-06 DIAGNOSIS — D721 Eosinophilia, unspecified: Secondary | ICD-10-CM

## 2024-07-06 DIAGNOSIS — Z1231 Encounter for screening mammogram for malignant neoplasm of breast: Secondary | ICD-10-CM

## 2024-07-06 DIAGNOSIS — E785 Hyperlipidemia, unspecified: Secondary | ICD-10-CM | POA: Diagnosis not present

## 2024-07-06 LAB — CBC WITH DIFFERENTIAL/PLATELET
Basophils Absolute: 0.1 K/uL (ref 0.0–0.1)
Basophils Relative: 1.2 % (ref 0.0–3.0)
Eosinophils Absolute: 0.4 K/uL (ref 0.0–0.7)
Eosinophils Relative: 9.5 % — ABNORMAL HIGH (ref 0.0–5.0)
HCT: 43.1 % (ref 36.0–46.0)
Hemoglobin: 13.7 g/dL (ref 12.0–15.0)
Lymphocytes Relative: 38.6 % (ref 12.0–46.0)
Lymphs Abs: 1.7 K/uL (ref 0.7–4.0)
MCHC: 31.8 g/dL (ref 30.0–36.0)
MCV: 77.9 fl — ABNORMAL LOW (ref 78.0–100.0)
Monocytes Absolute: 0.3 K/uL (ref 0.1–1.0)
Monocytes Relative: 7.4 % (ref 3.0–12.0)
Neutro Abs: 1.9 K/uL (ref 1.4–7.7)
Neutrophils Relative %: 43.3 % (ref 43.0–77.0)
Platelets: 358 K/uL (ref 150.0–400.0)
RBC: 5.53 Mil/uL — ABNORMAL HIGH (ref 3.87–5.11)
RDW: 13.9 % (ref 11.5–15.5)
WBC: 4.3 K/uL (ref 4.0–10.5)

## 2024-07-06 LAB — LIPID PANEL
Cholesterol: 148 mg/dL (ref 0–200)
HDL: 70.2 mg/dL (ref >39.00)
LDL Cholesterol: 67 mg/dL (ref 0–99)
NonHDL: 77.63
Total CHOL/HDL Ratio: 2
Triglycerides: 52 mg/dL (ref 0.0–149.0)
VLDL: 10.4 mg/dL (ref 0.0–40.0)

## 2024-07-06 LAB — B12 AND FOLATE PANEL
Folate: 22.9 ng/mL (ref >5.9)
Vitamin B-12: 430 pg/mL (ref 211–911)

## 2024-07-06 LAB — VITAMIN D 25 HYDROXY (VIT D DEFICIENCY, FRACTURES): VITD: 85.48 ng/mL (ref 30.00–100.00)

## 2024-07-06 LAB — TSH: TSH: 1.38 u[IU]/mL (ref 0.35–5.50)

## 2024-07-06 LAB — HEMOGLOBIN A1C: Hgb A1c MFr Bld: 6.5 % (ref 4.6–6.5)

## 2024-07-06 NOTE — Assessment & Plan Note (Signed)
 Reviewed emergency room visit.  Fortunately cramps have resolved.  Discussed over-the-counter B complex vitamins if needed

## 2024-07-06 NOTE — Assessment & Plan Note (Signed)
 Chronic, stable.  Pending A1c.  Patient currently taking metformin  500 mg daily.  Advised she may increase to 1000 mg daily if desired for additional benefit of weight loss

## 2024-07-06 NOTE — Progress Notes (Signed)
 Assessment & Plan:  Leg cramping Assessment & Plan: Reviewed emergency room visit.  Fortunately cramps have resolved.  Discussed over-the-counter B complex vitamins if needed  Orders: -     TSH -     CBC with Differential/Platelet -     Iron, TIBC and Ferritin Panel -     B12 and Folate Panel  Encounter for screening mammogram for malignant neoplasm of breast -     3D Screening Mammogram, Left and Right; Future  Eosinophilia, unspecified type  Osteoporosis, unspecified osteoporosis type, unspecified pathological fracture presence -     VITAMIN D  25 Hydroxy (Vit-D Deficiency, Fractures)  Encounter for lipid screening for cardiovascular disease -     Lipid panel  History of prediabetes Assessment & Plan: Chronic, stable.  Pending A1c.  Patient currently taking metformin  500 mg daily.  Advised she may increase to 1000 mg daily if desired for additional benefit of weight loss  Orders: -     Hemoglobin A1c  Hyperlipidemia, unspecified hyperlipidemia type Assessment & Plan: Chronic, anticipate stable.  Pending lipid panel.  Continue Lipitor 10 mg daily      Return precautions given.   Risks, benefits, and alternatives of the medications and treatment plan prescribed today were discussed, and patient expressed understanding.   Education regarding symptom management and diagnosis given to patient on AVS either electronically or printed.  Return in about 6 months (around 01/06/2025).  [name], FNP  Subjective:    Patient ID: Brittany Farley, female    DOB: November 04, 1951, 73 y.o.   MRN: [id number]  CC: Brittany Farley is a 73 y.o. female who presents today for follow up.   HPI: Feels well today.  No new complaints.  Leg cramps have resolved.  She is using over-the-counter once daily electrolyte supplement.    Evaluated in the emergency room 06/07/2024 for BL leg cramping.  Normal CK, no profound electrolyte derangements or anemia.  Given 1 L fluid.  Ca 8.7  Allergies:  Patient has no known allergies. Current Outpatient Medications on File Prior to Visit  Medication Sig Dispense Refill   alendronate  (FOSAMAX ) 70 MG tablet Take 1 tablet (70 mg total) by mouth once a week. Take with a full glass of water on an empty stomach. 12 tablet 3   aspirin EC 81 MG tablet Take 81 mg by mouth daily.     atorvastatin  (LIPITOR) 10 MG tablet TAKE 1 TABLET BY MOUTH DAILY 100 tablet 2   Calcium  Carbonate-Vit D-Min (CALCIUM  1200 PO) Take by mouth in the morning and at bedtime.     hydrocortisone  1 % ointment Apply 1 Application topically 2 (two) times daily. 30 g 0   metFORMIN  (GLUCOPHAGE -XR) 500 MG 24 hr tablet TAKE 1 TABLET BY MOUTH EVERY DAY IN THE EVENING 90 tablet 1   Multiple Vitamin (MULTIVITAMIN ADULT PO) Take by mouth.     No current facility-administered medications on file prior to visit.    Review of Systems  Constitutional:  Negative for chills and fever.  Respiratory:  Negative for cough.   Cardiovascular:  Negative for chest pain and palpitations.  Gastrointestinal:  Negative for nausea and vomiting.      Objective:    BP 130/80   Pulse 75   Temp (!) 97.5 F (36.4 C) (Oral)   Ht 5' 4 (1.626 m)   Wt 158 lb 6.4 oz (71.8 kg)   SpO2 96%   BMI 27.19 kg/m  BP Readings from Last 3 Encounters:  07/06/24 130/80  06/07/24 (!) 152/76  01/26/24 132/68   Wt Readings from Last 3 Encounters:  07/06/24 158 lb 6.4 oz (71.8 kg)  06/07/24 160 lb (72.6 kg)  01/26/24 158 lb (71.7 kg)    Physical Exam Vitals reviewed.  Constitutional:      Appearance: She is well-developed.  Eyes:     Conjunctiva/sclera: Conjunctivae normal.  Cardiovascular:     Rate and Rhythm: Normal rate and regular rhythm.     Pulses: Normal pulses.     Heart sounds: Normal heart sounds.     Comments: No LE edema, palpable cords or masses. No erythema or increased warmth. No asymmetry in calf size when compared bilaterally   Pulmonary:     Effort: Pulmonary effort is normal.      Breath sounds: Normal breath sounds. No wheezing, rhonchi or rales.  Musculoskeletal:     Right lower leg: No edema.     Left lower leg: No edema.  Skin:    General: Skin is warm and dry.  Neurological:     Mental Status: She is alert.  Psychiatric:        Speech: Speech normal.        Behavior: Behavior normal.        Thought Content: Thought content normal.

## 2024-07-06 NOTE — Patient Instructions (Addendum)
 Trial of B complex vitamin over the counter if symptom recurs  Nice to see you, always.

## 2024-07-06 NOTE — Assessment & Plan Note (Signed)
 Chronic, anticipate stable.  Pending lipid panel.  Continue Lipitor 10 mg daily

## 2024-07-07 LAB — IRON,TIBC AND FERRITIN PANEL
%SAT: 10 % — ABNORMAL LOW (ref 16–45)
Ferritin: 16 ng/mL (ref 16–288)
Iron: 48 ug/dL (ref 45–160)
TIBC: 469 ug/dL — ABNORMAL HIGH (ref 250–450)

## 2024-07-14 ENCOUNTER — Ambulatory Visit: Payer: Self-pay | Admitting: Family

## 2024-07-14 DIAGNOSIS — R899 Unspecified abnormal finding in specimens from other organs, systems and tissues: Secondary | ICD-10-CM

## 2024-07-15 ENCOUNTER — Other Ambulatory Visit: Payer: Self-pay | Admitting: Family

## 2024-07-15 DIAGNOSIS — Z1231 Encounter for screening mammogram for malignant neoplasm of breast: Secondary | ICD-10-CM

## 2024-07-19 ENCOUNTER — Encounter

## 2024-07-26 ENCOUNTER — Encounter: Payer: Self-pay | Admitting: Family

## 2024-07-26 ENCOUNTER — Ambulatory Visit: Admitting: Family

## 2024-07-26 VITALS — BP 130/80 | HR 69 | Temp 97.8°F | Ht 63.0 in | Wt 155.4 lb

## 2024-07-26 DIAGNOSIS — E119 Type 2 diabetes mellitus without complications: Secondary | ICD-10-CM | POA: Diagnosis not present

## 2024-07-26 DIAGNOSIS — Z23 Encounter for immunization: Secondary | ICD-10-CM

## 2024-07-26 NOTE — Patient Instructions (Signed)
 Metformin  is used in prediabetes, diabetes, and also for weight loss by decreasing calorie consumption.   It works in a couple of ways by decreasing liver glucose production, decreases intestinal absorption of glucose and improves insulin sensitivity (increases peripheral glucose uptake and utilization).     Please make sure that you titrate per below so not to cause any GI upset.    Start metformin  XR with one 500mg  tablet at night.   After one week, you may increase to two tablets at night ( total of 1000mg ) . The third week, you may take take two tablets at night and one tablet in the morning.   The fourth week, you may take two tablets in the morning ( 1000mg  total) and two tablets at night (1000mg  total).   This will bring you to a maximum daily dose of 2000mg /day which is maximum dose.  So you are aware,  you may take ALL 4 tablets of metformin  together at the same time if preferable and doesn't cause GI upset. You may take metformin  2000mg  ( four of the 500mg  tablets) together in the morning or at night if you prefer.   Along the way, if you want to increase more slowly, please do as this medication can cause GI discomfort and loose stools which usually get better with time , however some patients find that they can only tolerate a certain dose and cannot increase to maximum dose.   Always my pleasure to see you.

## 2024-07-26 NOTE — Assessment & Plan Note (Signed)
 Chronic, stable.  Counseled on how to start metformin  500 mg daily and slowly titrate.  Monitor for GI symptoms.  All questions answered.  Follow-up in 3 months time

## 2024-07-26 NOTE — Progress Notes (Signed)
 Assessment & Plan:  Need for influenza vaccination -     Flu vaccine HIGH DOSE PF(Fluzone Trivalent)  Controlled type 2 diabetes mellitus without complication, without long-term current use of insulin (HCC) Assessment & Plan: Chronic, stable.  Counseled on how to start metformin  500 mg daily and slowly titrate.  Monitor for GI symptoms.  All questions answered.  Follow-up in 3 months time      Return precautions given.   Risks, benefits, and alternatives of the medications and treatment plan prescribed today were discussed, and patient expressed understanding.   Education regarding symptom management and diagnosis given to patient on AVS either electronically or printed.  Return in about 3 months (around 10/25/2024).  Brittany Northern, FNP  Subjective:    Patient ID: Brittany Farley, female    DOB: 02/09/1951, 73 y.o.   MRN: [id number]  CC: Brittany Farley is a 73 y.o. female who presents today for follow up.   HPI: Feels well today.  She remains diligent in exercising and eating healthy.  She is pleased with weight loss so far. Here to discuss dosing and titration of metformin  500mg  She has yet to start. She was unsure of time of day and number of tablets.      Allergies: Patient has no known allergies. Current Outpatient Medications on File Prior to Visit  Medication Sig Dispense Refill   alendronate  (FOSAMAX ) 70 MG tablet Take 1 tablet (70 mg total) by mouth once a week. Take with a full glass of water on an empty stomach. 12 tablet 3   aspirin EC 81 MG tablet Take 81 mg by mouth daily.     atorvastatin  (LIPITOR) 10 MG tablet TAKE 1 TABLET BY MOUTH DAILY 100 tablet 2   Calcium  Carbonate-Vit D-Min (CALCIUM  1200 PO) Take by mouth in the morning and at bedtime.     hydrocortisone  1 % ointment Apply 1 Application topically 2 (two) times daily. 30 g 0   metFORMIN  (GLUCOPHAGE -XR) 500 MG 24 hr tablet TAKE 1 TABLET BY MOUTH EVERY DAY IN THE EVENING 90 tablet 1   Multiple Vitamin  (MULTIVITAMIN ADULT PO) Take by mouth.     No current facility-administered medications on file prior to visit.    Review of Systems  Constitutional:  Negative for chills and fever.  Respiratory:  Negative for cough.   Cardiovascular:  Negative for chest pain and palpitations.  Gastrointestinal:  Negative for nausea and vomiting.      Objective:    BP 130/80   Pulse 69   Temp 97.8 F (36.6 C) (Oral)   Ht 5' 3 (1.6 m)   Wt 155 lb 6.4 oz (70.5 kg)   SpO2 99%   BMI 27.53 kg/m  BP Readings from Last 3 Encounters:  07/26/24 130/80  07/06/24 130/80  06/07/24 (!) 152/76   Wt Readings from Last 3 Encounters:  07/26/24 155 lb 6.4 oz (70.5 kg)  07/06/24 158 lb 6.4 oz (71.8 kg)  06/07/24 160 lb (72.6 kg)   Lab Results  Component Value Date   HGBA1C 6.5 07/06/2024     Physical Exam Vitals reviewed.  Constitutional:      Appearance: She is well-developed.  Eyes:     Conjunctiva/sclera: Conjunctivae normal.  Cardiovascular:     Rate and Rhythm: Normal rate and regular rhythm.     Pulses: Normal pulses.     Heart sounds: Normal heart sounds.  Pulmonary:     Effort: Pulmonary effort is normal.     Breath sounds:  Normal breath sounds. No wheezing, rhonchi or rales.  Skin:    General: Skin is warm and dry.  Neurological:     Mental Status: She is alert.  Psychiatric:        Speech: Speech normal.        Behavior: Behavior normal.        Thought Content: Thought content normal.

## 2024-07-29 ENCOUNTER — Encounter

## 2024-07-29 DIAGNOSIS — Z1231 Encounter for screening mammogram for malignant neoplasm of breast: Secondary | ICD-10-CM

## 2024-08-08 ENCOUNTER — Other Ambulatory Visit: Payer: Self-pay | Admitting: Family

## 2024-08-08 MED ORDER — METFORMIN HCL ER 500 MG PO TB24: 500.0000 mg | ORAL_TABLET | Freq: Every evening | ORAL | 0 refills | Status: DC

## 2024-08-08 NOTE — Telephone Encounter (Signed)
 Copied from CRM [id number]. Topic: Clinical - Medication Refill >> Aug 08, 2024  9:32 AM Zebedee SAUNDERS wrote: Medication: metFORMIN  (GLUCOPHAGE -XR) 500 MG 24 hr tablet  Has the patient contacted their pharmacy? Yes (Agent: If no, request that the patient contact the pharmacy for the refill. If patient does not wish to contact the pharmacy document the reason why and proceed with request.) (Agent: If yes, when and what did the pharmacy advise?)Pharmacy need PCP approval.   This is the patient's preferred pharmacy:  [id number] [place], [place] - [address] [address] Phone: [phone] Fax: [phone]  Is this the correct pharmacy for this prescription? Yes If no, delete pharmacy and type the correct one.   Has the prescription been filled recently? Yes  Is the patient out of the medication? Yes  Has the patient been seen for an appointment in the last year OR does the patient have an upcoming appointment? Yes  Can we respond through MyChart? Yes  Agent: Please be advised that Rx refills may take up to 3 business days. We ask that you follow-up with your pharmacy.

## 2024-08-22 ENCOUNTER — Encounter

## 2024-09-02 ENCOUNTER — Encounter: Payer: Self-pay | Admitting: Family

## 2024-09-02 ENCOUNTER — Ambulatory Visit: Payer: Medicare Other | Admitting: Family

## 2024-09-02 VITALS — BP 130/78 | HR 78 | Temp 97.8°F | Ht 64.0 in | Wt 149.0 lb

## 2024-09-02 DIAGNOSIS — E119 Type 2 diabetes mellitus without complications: Secondary | ICD-10-CM

## 2024-09-02 DIAGNOSIS — R899 Unspecified abnormal finding in specimens from other organs, systems and tissues: Secondary | ICD-10-CM

## 2024-09-02 DIAGNOSIS — Z7984 Long term (current) use of oral hypoglycemic drugs: Secondary | ICD-10-CM

## 2024-09-02 DIAGNOSIS — Z0000 Encounter for general adult medical examination without abnormal findings: Secondary | ICD-10-CM

## 2024-09-02 LAB — CBC WITH DIFFERENTIAL/PLATELET
Basophils Absolute: 0 K/uL (ref 0.0–0.1)
Basophils Relative: 1.1 % (ref 0.0–3.0)
Eosinophils Absolute: 0.4 K/uL (ref 0.0–0.7)
Eosinophils Relative: 8.2 % — ABNORMAL HIGH (ref 0.0–5.0)
HCT: 44.9 % (ref 36.0–46.0)
Hemoglobin: 14.2 g/dL (ref 12.0–15.0)
Lymphocytes Relative: 40.4 % (ref 12.0–46.0)
Lymphs Abs: 1.7 K/uL (ref 0.7–4.0)
MCHC: 31.7 g/dL (ref 30.0–36.0)
MCV: 78.1 fl (ref 78.0–100.0)
Monocytes Absolute: 0.4 K/uL (ref 0.1–1.0)
Monocytes Relative: 9 % (ref 3.0–12.0)
Neutro Abs: 1.8 K/uL (ref 1.4–7.7)
Neutrophils Relative %: 41.3 % — ABNORMAL LOW (ref 43.0–77.0)
Platelets: 315 K/uL (ref 150.0–400.0)
RBC: 5.75 Mil/uL — ABNORMAL HIGH (ref 3.87–5.11)
RDW: 13.7 % (ref 11.5–15.5)
WBC: 4.3 K/uL (ref 4.0–10.5)

## 2024-09-02 MED ORDER — OZEMPIC (0.25 OR 0.5 MG/DOSE) 2 MG/3ML ~~LOC~~ SOPN: 0.2500 mg | PEN_INJECTOR | SUBCUTANEOUS | 2 refills | Status: DC

## 2024-09-02 NOTE — Assessment & Plan Note (Signed)
 Deferred CBE due to patient preference. Deferred pelvic exam in the absence of concerns and she is no longer screening for cervical cancer. Congratulated patient on diligence to diet.

## 2024-09-02 NOTE — Assessment & Plan Note (Signed)
 Presume improved glycemic control in setting of weight loss. Patient prefers to trial GLP1. Stop metformin  and trial ozempic 0.25mg , titrate. Counseled on black box warning, MOA and side effects. She will let me know how she is doing.

## 2024-09-02 NOTE — Patient Instructions (Signed)
 Stop metformin  if you start ozempic; you do NOT need both medications.   Start ozempic 0.25mg  once per week once per week injected subcutaneously ( [place])  in stomach. Please clean with alcohol swab prior to injection and be sure to rotate site. You may schedule a nurse visit if you would like to first injection.   After 4 weeks, and if tolerated and weight loss has not reached 1-2 lbs per week, please increase to 0.5mg  once per week [place].    Please read information on medication below and remember black box warning that you may not take if you or a family member is diagnosed with thyroid  cancer (medullary thyroid  cancer), or multiple endocrine neoplasia ( MEN).       Semaglutide  injection solution What is this medicine? SEMAGLUTIDE  (Sem a GLOO tide) is used to improve blood sugar control in adults with type 2 diabetes. This medicine may be used with other diabetes medicines. This drug may also reduce the risk of heart attack or stroke if you have type 2 diabetes and risk factors for heart disease. This medicine may be used for other purposes; ask your health care provider or pharmacist if you have questions. COMMON BRAND NAME(S): OZEMPIC What should I tell my health care provider before I take this medicine? They need to know if you have any of these conditions: endocrine tumors (MEN 2) or if someone in your family had these tumors eye disease, vision problems history of pancreatitis kidney disease stomach problems thyroid  cancer or if someone in your family had thyroid  cancer an unusual or allergic reaction to semaglutide , other medicines, foods, dyes, or preservatives pregnant or trying to get pregnant breast-feeding How should I use this medicine? This medicine is for injection under the skin of your upper leg (thigh), stomach area, or upper arm. It is given once every week (every 7 days). You will be taught how to prepare and give this medicine. Use exactly as directed. Take your medicine  at regular intervals. Do not take it more often than directed. If you use this medicine with insulin, you should inject this medicine and the insulin separately. Do not mix them together. Do not give the injections right next to each other. Change (rotate) injection sites with each injection. It is important that you put your used needles and syringes in a special sharps container. Do not put them in a trash can. If you do not have a sharps container, call your pharmacist or healthcare provider to get one. A special MedGuide will be given to you by the pharmacist with each prescription and refill. Be sure to read this information carefully each time. This drug comes with INSTRUCTIONS FOR USE. Ask your pharmacist for directions on how to use this drug. Read the information carefully. Talk to your pharmacist or health care provider if you have questions. Talk to your pediatrician regarding the use of this medicine in children. Special care may be needed. Overdosage: If you think you have taken too much of this medicine contact a poison control center or emergency room at once. NOTE: This medicine is only for you. Do not share this medicine with others. What if I miss a dose? If you miss a dose, take it as soon as you can within 5 days after the missed dose. Then take your next dose at your regular weekly time. If it has been longer than 5 days after the missed dose, do not take the missed dose. Take the next dose at  your regular time. Do not take double or extra doses. If you have questions about a missed dose, contact your health care provider for advice. What may interact with this medicine? other medicines for diabetes Many medications may cause changes in blood sugar, these include: alcohol containing beverages antiviral medicines for HIV or AIDS aspirin and aspirin-like drugs certain medicines for blood pressure, heart disease, irregular heart beat chromium diuretics female hormones, such as  estrogens or progestins, birth control pills fenofibrate gemfibrozil isoniazid lanreotide female hormones or anabolic steroids MAOIs like Carbex, Eldepryl, Marplan, Nardil, and Parnate medicines for weight loss medicines for allergies, asthma, cold, or cough medicines for depression, anxiety, or psychotic disturbances niacin nicotine NSAIDs, medicines for pain and inflammation, like ibuprofen or naproxen octreotide pasireotide pentamidine phenytoin probenecid quinolone antibiotics such as ciprofloxacin, levofloxacin, ofloxacin some herbal dietary supplements steroid medicines such as prednisone or cortisone sulfamethoxazole; trimethoprim thyroid  hormones Some medications can hide the warning symptoms of low blood sugar (hypoglycemia). You may need to monitor your blood sugar more closely if you are taking one of these medications. These include: beta-blockers, often used for high blood pressure or heart problems (examples include atenolol, metoprolol, propranolol) clonidine guanethidine reserpine This list may not describe all possible interactions. Give your health care provider a list of all the medicines, herbs, non-prescription drugs, or dietary supplements you use. Also tell them if you smoke, drink alcohol, or use illegal drugs. Some items may interact with your medicine. What should I watch for while using this medicine? Visit your doctor or health care professional for regular checks on your progress. Drink plenty of fluids while taking this medicine. Check with your doctor or health care professional if you get an attack of severe diarrhea, nausea, and vomiting. The loss of too much body fluid can make it dangerous for you to take this medicine. A test called the HbA1C (A1C) will be monitored. This is a simple blood test. It measures your blood sugar control over the last 2 to 3 months. You will receive this test every 3 to 6 months. Learn how to check your blood sugar. Learn  the symptoms of low and high blood sugar and how to manage them. Always carry a quick-source of sugar with you in case you have symptoms of low blood sugar. Examples include hard sugar candy or glucose tablets. Make sure others know that you can choke if you eat or drink when you develop serious symptoms of low blood sugar, such as seizures or unconsciousness. They must get medical help at once. Tell your doctor or health care professional if you have high blood sugar. You might need to change the dose of your medicine. If you are sick or exercising more than usual, you might need to change the dose of your medicine. Do not skip meals. Ask your doctor or health care professional if you should avoid alcohol. Many nonprescription cough and cold products contain sugar or alcohol. These can affect blood sugar. Pens should never be shared. Even if the needle is changed, sharing may result in passing of viruses like hepatitis or HIV. Wear a medical ID bracelet or chain, and carry a card that describes your disease and details of your medicine and dosage times. Do not become pregnant while taking this medicine. Women should inform their doctor if they wish to become pregnant or think they might be pregnant. There is a potential for serious side effects to an unborn child. Talk to your health care professional or pharmacist for  more information. What side effects may I notice from receiving this medicine? Side effects that you should report to your doctor or health care professional as soon as possible: allergic reactions like skin rash, itching or hives, swelling of the face, lips, or tongue breathing problems changes in vision diarrhea that continues or is severe lump or swelling on the neck severe nausea signs and symptoms of infection like fever or chills; cough; sore throat; pain or trouble passing urine signs and symptoms of low blood sugar such as feeling anxious, confusion, dizziness, increased  hunger, unusually weak or tired, sweating, shakiness, cold, irritable, headache, blurred vision, fast heartbeat, loss of consciousness signs and symptoms of kidney injury like trouble passing urine or change in the amount of urine trouble swallowing unusual stomach upset or pain vomiting Side effects that usually do not require medical attention (report to your doctor or health care professional if they continue or are bothersome): constipation diarrhea nausea pain, redness, or irritation at site where injected stomach upset This list may not describe all possible side effects. Call your doctor for medical advice about side effects. You may report side effects to FDA at 1-800-FDA-1088. Where should I keep my medicine? Keep out of the reach of children. Store unopened pens in a refrigerator between 2 and 8 degrees C (36 and 46 degrees F). Do not freeze. Protect from light and heat. After you first use the pen, it can be stored for 56 days at room temperature between 15 and 30 degrees C (59 and 86 degrees F) or in a refrigerator. Throw away your used pen after 56 days or after the expiration date, whichever comes first. Do not store your pen with the needle attached. If the needle is left on, medicine may leak from the pen. NOTE: This sheet is a summary. It may not cover all possible information. If you have questions about this medicine, talk to your doctor, pharmacist, or health care provider.  2021 Elsevier/Gold Standard (2019-07-12 09:41:51)

## 2024-09-02 NOTE — Progress Notes (Signed)
 Assessment & Plan:  Controlled type 2 diabetes mellitus without complication, without long-term current use of insulin (HCC) Assessment & Plan: Presume improved glycemic control in setting of weight loss. Patient prefers to trial GLP1. Stop metformin  and trial ozempic 0.25mg , titrate. Counseled on black box warning, MOA and side effects. She will let me know how she is doing.  Orders: -     Ozempic (0.25 or 0.5 MG/DOSE); Inject 0.25 mg into the skin once a week.  Dispense: 3 mL; Refill: 2  Abnormal laboratory test -     CBC with Differential/Platelet -     Iron, TIBC and Ferritin Panel  Routine physical examination Assessment & Plan: Deferred CBE due to patient preference. Deferred pelvic exam in the absence of concerns and she is no longer screening for cervical cancer. Congratulated patient on diligence to diet.       Return precautions given.   Risks, benefits, and alternatives of the medications and treatment plan prescribed today were discussed, and patient expressed understanding.   Education regarding symptom management and diagnosis given to patient on AVS either electronically or printed.  No follow-ups on file.  Brittany Northern, FNP  Subjective:    Patient ID: Brittany Farley, female    DOB: November 24, 1950, 73 y.o.   MRN: [id number]  CC: Brittany Farley is a 73 y.o. female who presents today for physical exam.    HPI: Feels well today No new complaints  She is tolerating metformin  500mg  every day    No family h/o MEN, MTC. She denies h/o pancreatitis    Colorectal Cancer Screening: UTD , 11/2018 Dr Janalyn; repeat in 10 years Breast Cancer Screening: Mammogram scheduled Cervical Cancer Screening: No longer screening for cervical cancer. had hysterectomy for fibroids. No cancer. Has both ovaries and cervix.  Bone Health screening/DEXA for 65+:UTD, ordered by Dr Sam; wrist improved by 8% ; continued on fosamax   Lung Cancer Screening: Doesn't have 20 year pack  year history and age > 71 years yo 62 years        Tetanus - UTD        Pneumococcal - Complete Exercise: Gets regular exercise.   Alcohol use:  occassional Smoking/tobacco use: Nonsmoker.    Health Maintenance  Topic Date Due   Complete foot exam   Never done   Eye exam for diabetics  01/17/2021   COVID-19 Vaccine (7 - 2025-26 season) 07/11/2024   Medicare Annual Wellness Visit  09/06/2024   Hemoglobin A1C  01/06/2025   Yearly kidney health urinalysis for diabetes  01/20/2025   Yearly kidney function blood test for diabetes  06/07/2025   DTaP/Tdap/Td vaccine (2 - Td or Tdap) 03/18/2027   Colon Cancer Screening  11/29/2028   Pneumococcal Vaccine for age over 74  Completed   Flu Shot  Completed   DEXA scan (bone density measurement)  Completed   Hepatitis C Screening  Completed   Zoster (Shingles) Vaccine  Completed   Meningitis B Vaccine  Aged Out   Hepatitis B Vaccine  Discontinued   Breast Cancer Screening  Discontinued    ALLERGIES: Patient has no known allergies.  Current Outpatient Medications on File Prior to Visit  Medication Sig Dispense Refill   alendronate  (FOSAMAX ) 70 MG tablet Take 1 tablet (70 mg total) by mouth once a week. Take with a full glass of water on an empty stomach. 12 tablet 3   aspirin EC 81 MG tablet Take 81 mg by mouth daily.     atorvastatin  (LIPITOR)  10 MG tablet TAKE 1 TABLET BY MOUTH DAILY 100 tablet 2   Calcium  Carbonate-Vit D-Min (CALCIUM  1200 PO) Take by mouth in the morning and at bedtime.     hydrocortisone  1 % ointment Apply 1 Application topically 2 (two) times daily. 30 g 0   metFORMIN  (GLUCOPHAGE -XR) 500 MG 24 hr tablet Take 1 tablet (500 mg total) by mouth every evening. 30 tablet 0   Multiple Vitamin (MULTIVITAMIN ADULT PO) Take by mouth.     No current facility-administered medications on file prior to visit.    Review of Systems  Constitutional:  Negative for chills, fever and unexpected weight change.  HENT:  Negative for  congestion.   Respiratory:  Negative for cough.   Cardiovascular:  Negative for chest pain, palpitations and leg swelling.  Gastrointestinal:  Negative for nausea and vomiting.  Genitourinary:  Negative for pelvic pain and vaginal bleeding.  Musculoskeletal:  Negative for arthralgias and myalgias.  Skin:  Negative for rash.  Neurological:  Negative for headaches.  Hematological:  Negative for adenopathy.  Psychiatric/Behavioral:  Negative for confusion.       Objective:    BP 130/78   Pulse 78   Temp 97.8 F (36.6 C) (Oral)   Ht 5' 4 (1.626 m)   Wt 149 lb (67.6 kg)   SpO2 99%   BMI 25.58 kg/m   BP Readings from Last 3 Encounters:  09/02/24 130/78  07/26/24 130/80  07/06/24 130/80   Wt Readings from Last 3 Encounters:  09/02/24 149 lb (67.6 kg)  07/26/24 155 lb 6.4 oz (70.5 kg)  07/06/24 158 lb 6.4 oz (71.8 kg)    Physical Exam Vitals reviewed.  Constitutional:      Appearance: Normal appearance. She is well-developed.  Eyes:     Conjunctiva/sclera: Conjunctivae normal.  Neck:     Thyroid : No thyroid  mass or thyromegaly.  Cardiovascular:     Rate and Rhythm: Normal rate and regular rhythm.     Pulses: Normal pulses.     Heart sounds: Normal heart sounds.  Pulmonary:     Effort: Pulmonary effort is normal.     Breath sounds: Normal breath sounds. No wheezing, rhonchi or rales.  Abdominal:     General: Bowel sounds are normal. There is no distension.     Palpations: Abdomen is soft. Abdomen is not rigid. There is no fluid wave or mass.     Tenderness: There is no abdominal tenderness. There is no guarding or rebound.  Lymphadenopathy:     Head:     Right side of head: No submental, submandibular, tonsillar, preauricular, posterior auricular or occipital adenopathy.     Left side of head: No submental, submandibular, tonsillar, preauricular, posterior auricular or occipital adenopathy.     Cervical: No cervical adenopathy.  Skin:    General: Skin is warm and  dry.  Neurological:     Mental Status: She is alert.  Psychiatric:        Speech: Speech normal.        Behavior: Behavior normal.        Thought Content: Thought content normal.

## 2024-09-03 LAB — IRON,TIBC AND FERRITIN PANEL
%SAT: 19 % (ref 16–45)
Ferritin: 36 ng/mL (ref 16–288)
Iron: 84 ug/dL (ref 45–160)
TIBC: 454 ug/dL — ABNORMAL HIGH (ref 250–450)

## 2024-09-05 ENCOUNTER — Telehealth: Payer: Self-pay

## 2024-09-05 NOTE — Telephone Encounter (Signed)
 Spoke to pharmacist at [facility] and they are sending 6 week supply with no refill due to the doseage will probably increase

## 2024-09-05 NOTE — Telephone Encounter (Signed)
 Copied from CRM [id number]. Topic: Clinical - Medication Question >> Sep 05, 2024  9:20 AM Anairis L wrote: Reason for CRM: [facility] needs clarification on Semaglutide ,0.25 or 0.5MG /DOS, (OZEMPIC, 0.25 OR 0.5 MG/DOSE,) 2 MG/3ML SOP[facility] please call [phone]   Patient called in with request.

## 2024-09-07 ENCOUNTER — Encounter: Admitting: Family

## 2024-09-12 ENCOUNTER — Ambulatory Visit: Payer: Medicare Other | Admitting: *Deleted

## 2024-09-12 VITALS — Ht 64.0 in | Wt 149.0 lb

## 2024-09-12 DIAGNOSIS — Z0000 Encounter for general adult medical examination without abnormal findings: Secondary | ICD-10-CM | POA: Diagnosis not present

## 2024-09-12 NOTE — Progress Notes (Cosign Needed Addendum)
 Subjective:   Brittany Farley is a 73 y.o. female who presents for a Medicare Annual Wellness Visit.  Allergies (verified) Patient has no known allergies.   History: Past Medical History:  Diagnosis Date   Arthritis    right hand   Lymphedema    Osteoporosis    Past Surgical History:  Procedure Laterality Date   ABDOMINAL HYSTERECTOMY  1980   had hysterectomy for fibroids. No cancer. Has both ovaries and cervix.   BARIATRIC SURGERY     late 60's   CATARACT EXTRACTION W/PHACO Right 02/20/2020   Procedure: CATARACT EXTRACTION PHACO AND INTRAOCULAR LENS PLACEMENT (IOC) RIGHT 2.33  00:24.3;  Surgeon: Myrna Adine Anes, MD;  Location: [facility];  Service: Ophthalmology;  Laterality: Right;   CATARACT EXTRACTION W/PHACO Left 04/02/2020   Procedure: CATARACT EXTRACTION PHACO AND INTRAOCULAR LENS PLACEMENT (IOC) LEFT;  Surgeon: Myrna Adine Anes, MD;  Location: [facility];  Service: Ophthalmology;  Laterality: Left;  1.68 0:28.8   CHOLECYSTECTOMY  2011   COLONOSCOPY WITH PROPOFOL  N/A 11/29/2018   Procedure: COLONOSCOPY WITH PROPOFOL ;  Surgeon: Janalyn Keene NOVAK, MD;  Location: [facility];  Service: Endoscopy;  Laterality: N/A;   left total hip arthroplasty Left    10/29/2022, Dr Leora   OOPHORECTOMY     REPLACEMENT TOTAL HIP W/  RESURFACING IMPLANTS Right 05/2022   right total hip arthroplasty Right    Dr Leora 05/2022   TUMOR EXCISION N/A 11/28/2021   Procedure: TRANSRECTAL TUMOR EXCISION, polyp;  Surgeon: Jordis Laneta FALCON, MD;  Location: [facility];  Service: General;  Laterality: N/A;   Family History  Problem Relation Age of Onset   Breast cancer Mother 27   Alzheimer's disease Mother    Heart attack Mother 42   Heart attack Father 72   Lung cancer Sister    Diabetes Sister    Prostate cancer Brother    Heart disease Brother 29   Heart disease Brother 69   Breast cancer Maternal Aunt 2   Stroke Cousin    Hypertension Cousin    Thyroid  cancer  Neg Hx    Social History   Occupational History   Not on file  Tobacco Use   Smoking status: Never   Smokeless tobacco: Never  Vaping Use   Vaping status: Never Used  Substance and Sexual Activity   Alcohol use: Not Currently    Comment: occasional - 1-2 drinks/year   Drug use: Never   Sexual activity: Not on file   Tobacco Counseling Counseling given: Not Answered  SDOH Screenings   Food Insecurity: No Food Insecurity (09/12/2024)  Housing: Low Risk  (09/12/2024)  Transportation Needs: No Transportation Needs (09/12/2024)  Utilities: Not At Risk (09/12/2024)  Alcohol Screen: Low Risk  (09/06/2023)  Depression (PHQ2-9): Low Risk  (09/12/2024)  Financial Resource Strain: Low Risk  (08/29/2024)  Physical Activity: Sufficiently Active (09/12/2024)  Social Connections: Moderately Integrated (09/12/2024)  Stress: No Stress Concern Present (09/12/2024)  Tobacco Use: Low Risk  (09/12/2024)  Health Literacy: Adequate Health Literacy (09/12/2024)   Depression Screen    09/12/2024    1:13 PM 09/02/2024    8:09 AM 07/26/2024    7:56 AM 07/06/2024   10:06 AM 01/21/2024    9:31 AM 12/14/2023    8:12 AM 09/18/2023   10:52 AM  PHQ 2/9 Scores  PHQ - 2 Score 0 0 0 0 0 0 0  PHQ- 9 Score 0    0 0 0     Goals Addressed  This Visit's Progress    Patient Stated       Continue with weight loss       Visit info / Clinical Intake: Medicare Wellness Visit Type:: Subsequent Annual Wellness Visit Medicare Wellness Visit Mode:: Telephone If telephone:: video declined If telephone or video:: pt reported vitals Interpreter Needed?: No Pre-visit prep was completed: yes AWV questionnaire completed by patient prior to visit?: no Living arrangements:: (!) lives alone Patient's Overall Health Status Rating: very good Typical amount of pain: none Does pain affect daily life?: no Are you currently prescribed opioids?: no  Dietary Habits and Nutritional Risks How many meals a day?:  2 Eats fruit and vegetables daily?: yes Most meals are obtained by: preparing own meals Diabetic:: (!) yes Any non-healing wounds?: no How often do you check your BS?: 0 Would you like to be referred to a Nutritionist or for Diabetic Management? : no  Functional Status Activities of Daily Living (to include ambulation/medication): Independent Ambulation: Independent Medication Administration: Independent Home Management: Independent Manage your own finances?: yes Primary transportation is: driving Concerns about vision?: no *vision screening is required for WTM* (needs to call and schedule a diabetic eye exam) Concerns about hearing?: no  Fall Screening Falls in the past year?: 0 Number of falls in past year: 0 Was there an injury with Fall?: 0 Fall Risk Category Calculator: 0 Patient Fall Risk Level: Low Fall Risk  Fall Risk Patient at Risk for Falls Due to: No Fall Risks Fall risk Follow up: Falls evaluation completed; Falls prevention discussed  Home and Transportation Safety: All rugs have non-skid backing?: N/A, no rugs All stairs or steps have railings?: N/A, no stairs Grab bars in the bathtub or shower?: yes Have non-skid surface in bathtub or shower?: yes Good home lighting?: yes Regular seat belt use?: yes  Cognitive Assessment Difficulty concentrating, remembering, or making decisions? : no Will 6CIT or Mini Cog be Completed: yes What year is it?: 0 points What month is it?: 0 points Give patient an address phrase to remember (5 components): [address] [place] About what time is it?: 0 points Count backwards from 20 to 1: 0 points Say the months of the year in reverse: 0 points Repeat the address phrase from earlier: 0 points 6 CIT Score: 0 points  Advance Directives (For Healthcare) Does Patient Have a Medical Advance Directive?: No Would patient like information on creating a medical advance directive?: No - Patient declined  Reviewed/Updated   Reviewed/Updated: All        Objective:    Today's Vitals   09/12/24 1317  Weight: 149 lb (67.6 kg)  Height: 5' 4 (1.626 m)   Body mass index is 25.58 kg/m.  Current Medications (verified) Outpatient Encounter Medications as of 09/12/2024  Medication Sig   alendronate  (FOSAMAX ) 70 MG tablet Take 1 tablet (70 mg total) by mouth once a week. Take with a full glass of water on an empty stomach.   aspirin EC 81 MG tablet Take 81 mg by mouth daily.   atorvastatin  (LIPITOR) 10 MG tablet TAKE 1 TABLET BY MOUTH DAILY   Calcium  Carbonate-Vit D-Min (CALCIUM  1200 PO) Take by mouth in the morning and at bedtime.   hydrocortisone  1 % ointment Apply 1 Application topically 2 (two) times daily.   Multiple Vitamin (MULTIVITAMIN ADULT PO) Take by mouth.   Semaglutide ,0.25 or 0.5MG /DOS, (OZEMPIC, 0.25 OR 0.5 MG/DOSE,) 2 MG/3ML SOPN Inject 0.25 mg into the skin once a week.   [DISCONTINUED] metFORMIN  (  GLUCOPHAGE -XR) 500 MG 24 hr tablet Take 1 tablet (500 mg total) by mouth every evening.   No facility-administered encounter medications on file as of 09/12/2024.   Hearing/Vision screen Hearing Screening - Comments:: No issues Vision Screening - Comments:: Needs appointment scheduled [facility] to [facility] Immunizations and Health Maintenance Health Maintenance  Topic Date Due   FOOT EXAM  Never done   OPHTHALMOLOGY EXAM  01/17/2021   COVID-19 Vaccine (7 - 2025-26 season) 07/11/2024   HEMOGLOBIN A1C  01/06/2025   Diabetic kidney evaluation - Urine ACR  01/20/2025   Diabetic kidney evaluation - eGFR measurement  06/07/2025   Medicare Annual Wellness (AWV)  09/12/2025   DTaP/Tdap/Td (2 - Td or Tdap) 03/18/2027   Colonoscopy  11/29/2028   Pneumococcal Vaccine: 50+ Years  Completed   Influenza Vaccine  Completed   DEXA SCAN  Completed   Hepatitis C Screening  Completed   Zoster Vaccines- Shingrix  Completed   Meningococcal B Vaccine  Aged Out   Hepatitis B Vaccines 19-59 Average Risk   Discontinued   Mammogram  Discontinued        Assessment/Plan:  This is a routine wellness examination for [facility].  Patient Care Team: Dineen Rollene MATSU, FNP as PCP - General (Family Medicine) Anner Alm ORN, MD as PCP - Cardiology (Cardiology) Leora Lynwood SAUNDERS, MD as Consulting Physician (Orthopedic Surgery) [facility], [facility])  I have personally reviewed and noted the following in the patient's chart:   Medical and social history Use of alcohol, tobacco or illicit drugs  Current medications and supplements including opioid prescriptions. Functional ability and status Nutritional status Physical activity Advanced directives List of other physicians Hospitalizations, surgeries, and ER visits in previous 12 months Vitals Screenings to include cognitive, depression, and falls Referrals and appointments  No orders of the defined types were placed in this encounter.  In addition, I have reviewed and discussed with patient certain preventive protocols, quality metrics, and best practice recommendations. A written personalized care plan for preventive services as well as general preventive health recommendations were provided to patient.   Angeline Fredericks, LPN   88/04/[id number]   Return in 1 year (on 09/12/2025).  After Visit Summary: (MyChart) Due to this being a telephonic visit, the after visit summary with patients personalized plan was offered to patient via MyChart   Nurse Notes: Patient needs a diabetic foot exam at next visit.

## 2024-09-12 NOTE — Patient Instructions (Signed)
 [name],  Thank you for taking the time for your Medicare Wellness Visit. I appreciate your continued commitment to your health goals. Please review the care plan we discussed, and feel free to reach out if I can assist you further.  Please note that Annual Wellness Visits do not include a physical exam. Some assessments may be limited, especially if the visit was conducted virtually. If needed, we may recommend an in-person follow-up with your provider.  Ongoing Care Seeing your primary care provider every 3 to 6 months helps us  monitor your health and provide consistent, personalized care. Remember to call and schedule a diabetic eye exam.    Referrals If a referral was made during today's visit and you haven't received any updates within two weeks, please contact the referred provider directly to check on the status.  Recommended Screenings:  Health Maintenance  Topic Date Due   Complete foot exam   Never done   Eye exam for diabetics  01/17/2021   COVID-19 Vaccine (7 - 2025-26 season) 07/11/2024   Hemoglobin A1C  01/06/2025   Yearly kidney health urinalysis for diabetes  01/20/2025   Yearly kidney function blood test for diabetes  06/07/2025   Medicare Annual Wellness Visit  09/12/2025   DTaP/Tdap/Td vaccine (2 - Td or Tdap) 03/18/2027   Colon Cancer Screening  11/29/2028   Pneumococcal Vaccine for age over 6  Completed   Flu Shot  Completed   DEXA scan (bone density measurement)  Completed   Hepatitis C Screening  Completed   Zoster (Shingles) Vaccine  Completed   Meningitis B Vaccine  Aged Out   Hepatitis B Vaccine  Discontinued   Breast Cancer Screening  Discontinued       06/07/2024    8:15 AM  Advanced Directives  Does Patient Have a Medical Advance Directive? No  Would patient like information on creating a medical advance directive? No - Patient declined    Vision: Annual vision screenings are recommended for early detection of glaucoma, cataracts, and  diabetic retinopathy. These exams can also reveal signs of chronic conditions such as diabetes and high blood pressure.  Dental: Annual dental screenings help detect early signs of oral cancer, gum disease, and other conditions linked to overall health, including heart disease and diabetes.  Please see the attached documents for additional preventive care recommendations.

## 2024-09-14 NOTE — Progress Notes (Signed)
 Visit Complete: Virtual I connected with this patient by a audio enabled telemedicine application and verified that I am speaking with the correct person using two identifiers.  Patient Location: Home Provider Location: Office/Clinic   I discussed the limitations of evaluation and management by telemedicine. The patient expressed understanding and agreed to proceed.  Persons Participating in Visit: Patient

## 2024-09-19 ENCOUNTER — Ambulatory Visit: Payer: Self-pay | Admitting: Family

## 2024-09-19 DIAGNOSIS — D751 Secondary polycythemia: Secondary | ICD-10-CM

## 2024-10-03 ENCOUNTER — Ambulatory Visit
Admission: RE | Admit: 2024-10-03 | Discharge: 2024-10-03 | Disposition: A | Discharge status: Home / Self Care | Source: Ambulatory Visit | Attending: Family | Admitting: Family

## 2024-10-03 DIAGNOSIS — Z1231 Encounter for screening mammogram for malignant neoplasm of breast: Secondary | ICD-10-CM | POA: Insufficient documentation

## 2024-10-04 ENCOUNTER — Other Ambulatory Visit: Payer: Self-pay | Admitting: Family

## 2024-10-04 DIAGNOSIS — E119 Type 2 diabetes mellitus without complications: Secondary | ICD-10-CM

## 2024-10-10 ENCOUNTER — Other Ambulatory Visit (INDEPENDENT_AMBULATORY_CARE_PROVIDER_SITE_OTHER)

## 2024-10-10 DIAGNOSIS — D751 Secondary polycythemia: Secondary | ICD-10-CM

## 2024-10-11 LAB — JAK2 V617F RFX CALR/MPL/E12-15

## 2024-10-12 LAB — CARBOXYHEMOGLOBIN: Carboxyhemoglobin: 3 %{Hb}

## 2024-10-14 ENCOUNTER — Ambulatory Visit: Admitting: Family

## 2024-10-23 LAB — JAK2 V617F RFX CALR/MPL/E12-15

## 2024-10-23 LAB — ERYTHROPOIETIN: Erythropoietin: 7.1 m[IU]/mL (ref 2.6–18.5)

## 2024-10-23 LAB — CALR +MPL + E12-E15  (REFLEX)

## 2024-10-26 ENCOUNTER — Encounter: Payer: Self-pay | Admitting: Family

## 2024-10-26 ENCOUNTER — Ambulatory Visit: Admitting: Family

## 2024-10-26 VITALS — BP 130/70 | HR 98 | Temp 97.7°F | Ht 61.0 in | Wt 144.1 lb

## 2024-10-26 DIAGNOSIS — D721 Eosinophilia, unspecified: Secondary | ICD-10-CM | POA: Diagnosis not present

## 2024-10-26 DIAGNOSIS — D751 Secondary polycythemia: Secondary | ICD-10-CM

## 2024-10-26 DIAGNOSIS — E119 Type 2 diabetes mellitus without complications: Secondary | ICD-10-CM

## 2024-10-26 DIAGNOSIS — R7309 Other abnormal glucose: Secondary | ICD-10-CM

## 2024-10-26 DIAGNOSIS — Z7985 Long-term (current) use of injectable non-insulin antidiabetic drugs: Secondary | ICD-10-CM | POA: Diagnosis not present

## 2024-10-26 LAB — POCT GLYCOSYLATED HEMOGLOBIN (HGB A1C): Hemoglobin A1C: 5.7 % — AB (ref 4.0–5.6)

## 2024-10-26 NOTE — Assessment & Plan Note (Signed)
 Improved.  She is tolerating Ozempic  well.  She prefers to increase for additional benefit of weight loss.  Increase Ozempic  to 0.5 mg

## 2024-10-26 NOTE — Progress Notes (Signed)
 Assessment & Plan:  Elevated glucose -     POCT glycosylated hemoglobin (Hb A1C) -     CBC with Differential/Platelet; Future -     Hemoglobin A1c; Future -     Comprehensive metabolic panel with GFR; Future  Eosinophilia, unspecified type  Erythrocytosis Assessment & Plan: Persistent erythrocytosis.Neg JAK2 ,EPO 7.1 RBC range  5.55-5.75 Fluctuating Eosinophils 8.2 -12.2. Discussed concern for untreated sleep apnea.  Patient politely declines referral for sleep apnea evaluation at this time as she is an active state of weight loss on Ozempic .    we jointly agreed to repeat red blood cells in 3 months time.  If remains persistent, will arrange hematology consult.   Controlled type 2 diabetes mellitus without complication, without long-term current use of insulin (HCC) Assessment & Plan: Improved.  She is tolerating Ozempic  well.  She prefers to increase for additional benefit of weight loss.  Increase Ozempic  to 0.5 mg   Other orders -     Ozempic  (0.25 or 0.5 MG/DOSE); Inject 0.5 mg into the skin once a week.  Dispense: 3 mL; Refill: 2     Return precautions given.   Risks, benefits, and alternatives of the medications and treatment plan prescribed today were discussed, and patient expressed understanding.   Education regarding symptom management and diagnosis given to patient on AVS either electronically or printed.  No follow-ups on file.  Brittany Northern, FNP  Subjective:   thank you thank you  Patient ID: Brittany Farley, female    DOB: 1950/11/25, 73 y.o.   MRN: [id number]  CC: Brittany Farley is a 73 y.o. female who presents today for follow up.   HPI: Feels well today.  No new complaints.  She has been active and working out regularly.  She is eating a healthier diet.  Denies constipation or nausea on Ozempic .  She is inclined to increase Ozempic . She has a carbon monoxide detector in her home   10/10/24 Neg JAK2  EPO 7.1 RBC 5.55-5.75 Eosinophils 8.2  -12.2 Allergies: Patient has no known allergies. Medications Ordered Prior to Encounter[1]  Review of Systems  Constitutional:  Negative for chills and fever.  Respiratory:  Negative for cough.   Cardiovascular:  Negative for chest pain and palpitations.  Gastrointestinal:  Negative for nausea and vomiting.      Objective:    BP 130/70   Pulse 98   Temp 97.7 F (36.5 C) (Oral)   Ht 5' 1 (1.549 m)   Wt 144 lb 1.6 oz (65.4 kg)   SpO2 99%   BMI 27.23 kg/m  BP Readings from Last 3 Encounters:  10/26/24 130/70  09/02/24 130/78  07/26/24 130/80   Wt Readings from Last 3 Encounters:  10/26/24 144 lb 1.6 oz (65.4 kg)  09/12/24 149 lb (67.6 kg)  09/02/24 149 lb (67.6 kg)    Physical Exam Vitals reviewed.  Constitutional:      Appearance: She is well-developed.  Eyes:     Conjunctiva/sclera: Conjunctivae normal.  Neck:     Thyroid : No thyroid  mass or thyromegaly.  Cardiovascular:     Rate and Rhythm: Normal rate and regular rhythm.     Pulses: Normal pulses.     Heart sounds: Normal heart sounds.  Pulmonary:     Effort: Pulmonary effort is normal.     Breath sounds: Normal breath sounds. No wheezing, rhonchi or rales.  Lymphadenopathy:     Head:     Right side of head: No submental, submandibular, tonsillar, preauricular,  posterior auricular or occipital adenopathy.     Left side of head: No submental, submandibular, tonsillar, preauricular, posterior auricular or occipital adenopathy.     Cervical: No cervical adenopathy.  Skin:    General: Skin is warm and dry.  Neurological:     Mental Status: She is alert.  Psychiatric:        Speech: Speech normal.        Behavior: Behavior normal.        Thought Content: Thought content normal.           [1]  Current Outpatient Medications on File Prior to Visit  Medication Sig Dispense Refill   alendronate  (FOSAMAX ) 70 MG tablet Take 1 tablet (70 mg total) by mouth once a week. Take with a full glass of water on an  empty stomach. 12 tablet 3   aspirin EC 81 MG tablet Take 81 mg by mouth daily.     atorvastatin  (LIPITOR) 10 MG tablet TAKE 1 TABLET BY MOUTH DAILY 100 tablet 2   Calcium  Carbonate-Vit D-Min (CALCIUM  1200 PO) Take by mouth in the morning and at bedtime.     hydrocortisone  1 % ointment Apply 1 Application topically 2 (two) times daily. 30 g 0   Multiple Vitamin (MULTIVITAMIN ADULT PO) Take by mouth.     No current facility-administered medications on file prior to visit.

## 2024-10-26 NOTE — Assessment & Plan Note (Signed)
 Persistent erythrocytosis.Neg JAK2 ,EPO 7.1 RBC range  5.55-5.75 Fluctuating Eosinophils 8.2 -12.2. Discussed concern for untreated sleep apnea.  Patient politely declines referral for sleep apnea evaluation at this time as she is an active state of weight loss on Ozempic .    we jointly agreed to repeat red blood cells in 3 months time.  If remains persistent, will arrange hematology consult.

## 2024-11-09 NOTE — Progress Notes (Signed)
 Brittany Farley                                          MRN: [id number]   11/09/2024   The [facility] Quality Team Specialist reviewed this patient medical record for the purposes of chart review for care gap closure. The following were reviewed: abstraction for care gap closure-glycemic status assessment.    [facility] Quality Team

## 2024-11-09 NOTE — Progress Notes (Signed)
 Brittany Farley                                          MRN: [id number]   11/09/2024   The [facility] Quality Team Specialist reviewed this patient medical record for the purposes of chart review for care gap closure. The following were reviewed: chart review for care gap closure-diabetic eye exam.    [facility] Quality Team

## 2024-11-12 ENCOUNTER — Other Ambulatory Visit: Payer: Self-pay | Admitting: Family

## 2024-11-12 DIAGNOSIS — E785 Hyperlipidemia, unspecified: Secondary | ICD-10-CM

## 2024-12-12 ENCOUNTER — Other Ambulatory Visit: Payer: Self-pay | Admitting: Internal Medicine

## 2024-12-12 DIAGNOSIS — M810 Age-related osteoporosis without current pathological fracture: Secondary | ICD-10-CM

## 2025-01-25 ENCOUNTER — Ambulatory Visit: Admitting: Internal Medicine

## 2025-01-30 ENCOUNTER — Other Ambulatory Visit

## 2025-02-02 ENCOUNTER — Ambulatory Visit: Admitting: Family

## 2025-09-19 ENCOUNTER — Ambulatory Visit
